# Patient Record
Sex: Male | Born: 1955 | Race: Black or African American | Hispanic: No | Smoking: Never smoker
Health system: Southern US, Community
[De-identification: ages and names within clinical notes are randomized; demographics above are authoritative.]

## PROBLEM LIST (undated history)

## (undated) DIAGNOSIS — I639 Cerebral infarction, unspecified: Secondary | ICD-10-CM

## (undated) DIAGNOSIS — I10 Essential (primary) hypertension: Secondary | ICD-10-CM

## (undated) DIAGNOSIS — F0390 Unspecified dementia without behavioral disturbance: Secondary | ICD-10-CM

## (undated) DIAGNOSIS — J189 Pneumonia, unspecified organism: Secondary | ICD-10-CM

## (undated) DIAGNOSIS — E119 Type 2 diabetes mellitus without complications: Secondary | ICD-10-CM

## (undated) DIAGNOSIS — F209 Schizophrenia, unspecified: Secondary | ICD-10-CM

---

## 1998-10-01 ENCOUNTER — Emergency Department (HOSPITAL_COMMUNITY): Admission: EM | Admit: 1998-10-01 | Discharge: 1998-10-01 | Payer: Self-pay | Admitting: Emergency Medicine

## 1999-02-13 ENCOUNTER — Other Ambulatory Visit (HOSPITAL_COMMUNITY): Admission: RE | Admit: 1999-02-13 | Discharge: 1999-02-15 | Payer: Self-pay | Admitting: Psychiatry

## 1999-02-23 ENCOUNTER — Other Ambulatory Visit (HOSPITAL_COMMUNITY): Admission: RE | Admit: 1999-02-23 | Discharge: 1999-03-08 | Payer: Self-pay | Admitting: Psychiatry

## 2005-03-13 ENCOUNTER — Emergency Department (HOSPITAL_COMMUNITY): Admission: EM | Admit: 2005-03-13 | Discharge: 2005-03-13 | Payer: Self-pay | Admitting: Emergency Medicine

## 2006-10-04 ENCOUNTER — Emergency Department (HOSPITAL_COMMUNITY): Admission: EM | Admit: 2006-10-04 | Discharge: 2006-10-04 | Payer: Self-pay | Admitting: Emergency Medicine

## 2006-10-27 ENCOUNTER — Emergency Department (HOSPITAL_COMMUNITY): Admission: EM | Admit: 2006-10-27 | Discharge: 2006-10-27 | Payer: Self-pay | Admitting: Emergency Medicine

## 2007-07-13 ENCOUNTER — Encounter: Payer: Self-pay | Admitting: Family Medicine

## 2007-07-30 ENCOUNTER — Encounter: Payer: Self-pay | Admitting: Family Medicine

## 2007-08-30 ENCOUNTER — Encounter: Payer: Self-pay | Admitting: Family Medicine

## 2007-09-27 ENCOUNTER — Encounter: Payer: Self-pay | Admitting: Family Medicine

## 2008-08-30 ENCOUNTER — Ambulatory Visit: Payer: Self-pay | Admitting: Family Medicine

## 2008-10-12 ENCOUNTER — Ambulatory Visit: Payer: Self-pay | Admitting: Internal Medicine

## 2008-10-15 ENCOUNTER — Emergency Department (HOSPITAL_COMMUNITY): Admission: EM | Admit: 2008-10-15 | Discharge: 2008-10-15 | Payer: Self-pay | Admitting: Emergency Medicine

## 2008-10-26 ENCOUNTER — Ambulatory Visit: Payer: Self-pay | Admitting: Family Medicine

## 2008-10-26 LAB — CONVERTED CEMR LAB
AST: 35 units/L (ref 0–37)
Alkaline Phosphatase: 50 units/L (ref 39–117)
BUN: 15 mg/dL (ref 6–23)
Calcium: 9.2 mg/dL (ref 8.4–10.5)
Chloride: 102 meq/L (ref 96–112)
Eosinophils Absolute: 0 10*3/uL (ref 0.0–0.7)
Eosinophils Relative: 0 % (ref 0–5)
Free T4: 0.89 ng/dL (ref 0.89–1.80)
HCT: 43 % (ref 39.0–52.0)
LDL Cholesterol: 86 mg/dL (ref 0–99)
Lymphocytes Relative: 56 % — ABNORMAL HIGH (ref 12–46)
Lymphs Abs: 3.3 10*3/uL (ref 0.7–4.0)
MCHC: 36.3 g/dL — ABNORMAL HIGH (ref 30.0–36.0)
Neutro Abs: 1.6 10*3/uL — ABNORMAL LOW (ref 1.7–7.7)
Platelets: 139 10*3/uL — ABNORMAL LOW (ref 150–400)
Potassium: 4 meq/L (ref 3.5–5.3)
RBC: 5.22 M/uL (ref 4.22–5.81)
Sodium: 137 meq/L (ref 135–145)
TSH: 2.12 microintl units/mL (ref 0.350–4.500)
Triglycerides: 80 mg/dL (ref <150)
WBC: 5.9 10*3/uL (ref 4.0–10.5)

## 2008-12-01 ENCOUNTER — Ambulatory Visit: Payer: Self-pay | Admitting: Family Medicine

## 2009-01-29 ENCOUNTER — Emergency Department (HOSPITAL_COMMUNITY): Admission: EM | Admit: 2009-01-29 | Discharge: 2009-01-29 | Payer: Self-pay | Admitting: Emergency Medicine

## 2009-03-27 ENCOUNTER — Inpatient Hospital Stay (HOSPITAL_COMMUNITY): Admission: EM | Admit: 2009-03-27 | Discharge: 2009-03-31 | Payer: Self-pay | Admitting: Emergency Medicine

## 2009-03-28 ENCOUNTER — Encounter (INDEPENDENT_AMBULATORY_CARE_PROVIDER_SITE_OTHER): Payer: Self-pay | Admitting: Internal Medicine

## 2009-03-28 ENCOUNTER — Ambulatory Visit: Payer: Self-pay | Admitting: Vascular Surgery

## 2009-03-30 ENCOUNTER — Ambulatory Visit: Payer: Self-pay | Admitting: Physical Medicine & Rehabilitation

## 2009-04-24 ENCOUNTER — Inpatient Hospital Stay (HOSPITAL_COMMUNITY): Admission: EM | Admit: 2009-04-24 | Discharge: 2009-04-25 | Payer: Self-pay | Admitting: Emergency Medicine

## 2009-04-24 ENCOUNTER — Encounter (INDEPENDENT_AMBULATORY_CARE_PROVIDER_SITE_OTHER): Payer: Self-pay | Admitting: Internal Medicine

## 2009-04-25 ENCOUNTER — Encounter (INDEPENDENT_AMBULATORY_CARE_PROVIDER_SITE_OTHER): Payer: Self-pay | Admitting: Internal Medicine

## 2009-05-02 ENCOUNTER — Ambulatory Visit: Payer: Self-pay | Admitting: Internal Medicine

## 2009-05-18 ENCOUNTER — Ambulatory Visit: Payer: Self-pay | Admitting: Internal Medicine

## 2009-05-18 ENCOUNTER — Inpatient Hospital Stay (HOSPITAL_COMMUNITY)
Admission: EM | Admit: 2009-05-18 | Discharge: 2009-05-25 | Payer: Medicaid - Out of State | Admitting: Emergency Medicine

## 2009-05-19 ENCOUNTER — Encounter (INDEPENDENT_AMBULATORY_CARE_PROVIDER_SITE_OTHER): Payer: Self-pay | Admitting: Internal Medicine

## 2009-07-06 ENCOUNTER — Encounter (INDEPENDENT_AMBULATORY_CARE_PROVIDER_SITE_OTHER): Payer: Self-pay | Admitting: Internal Medicine

## 2009-07-06 ENCOUNTER — Ambulatory Visit: Payer: Self-pay | Admitting: Vascular Surgery

## 2009-07-06 ENCOUNTER — Inpatient Hospital Stay (HOSPITAL_COMMUNITY): Admission: EM | Admit: 2009-07-06 | Discharge: 2009-07-07 | Payer: Self-pay | Admitting: Emergency Medicine

## 2010-03-05 ENCOUNTER — Other Ambulatory Visit: Payer: Self-pay | Admitting: Emergency Medicine

## 2010-03-05 ENCOUNTER — Emergency Department (HOSPITAL_COMMUNITY): Admission: EM | Admit: 2010-03-05 | Discharge: 2010-03-06 | Payer: Self-pay | Admitting: Emergency Medicine

## 2010-03-29 ENCOUNTER — Emergency Department (HOSPITAL_COMMUNITY): Admission: EM | Admit: 2010-03-29 | Discharge: 2010-03-29 | Payer: Self-pay | Admitting: Emergency Medicine

## 2010-10-11 LAB — DIFFERENTIAL
Basophils Absolute: 0 10*3/uL (ref 0.0–0.1)
Basophils Relative: 1 % (ref 0–1)
Eosinophils Absolute: 0.1 10*3/uL (ref 0.0–0.7)
Neutro Abs: 1.4 10*3/uL — ABNORMAL LOW (ref 1.7–7.7)
Neutrophils Relative %: 27 % — ABNORMAL LOW (ref 43–77)

## 2010-10-11 LAB — CBC
Hemoglobin: 15.2 g/dL (ref 13.0–17.0)
MCH: 29.5 pg (ref 26.0–34.0)
Platelets: 125 10*3/uL — ABNORMAL LOW (ref 150–400)
RDW: 14.2 % (ref 11.5–15.5)

## 2010-10-11 LAB — BASIC METABOLIC PANEL
BUN: 12 mg/dL (ref 6–23)
Calcium: 9.4 mg/dL (ref 8.4–10.5)
Creatinine, Ser: 1.01 mg/dL (ref 0.4–1.5)
GFR calc Af Amer: >60 mL/min (ref >60)
GFR calc non Af Amer: >60 mL/min (ref >60)
Glucose, Bld: 98 mg/dL (ref 70–99)
Potassium: 4 mEq/L (ref 3.5–5.1)
Sodium: 136 mEq/L (ref 135–145)

## 2010-10-11 LAB — GLUCOSE, CAPILLARY: Glucose-Capillary: 120 mg/dL — ABNORMAL HIGH (ref 70–99)

## 2010-10-11 LAB — POCT CARDIAC MARKERS: Troponin i, poc: <0.05 ng/mL (ref 0.00–0.09)

## 2010-10-12 LAB — CBC
HCT: 40.6 % (ref 39.0–52.0)
Platelets: 129 10*3/uL — ABNORMAL LOW (ref 150–400)
RDW: 14.1 % (ref 11.5–15.5)
WBC: 6.2 10*3/uL (ref 4.0–10.5)

## 2010-10-12 LAB — BASIC METABOLIC PANEL
BUN: 5 mg/dL — ABNORMAL LOW (ref 6–23)
Calcium: 9.2 mg/dL (ref 8.4–10.5)
Creatinine, Ser: 0.84 mg/dL (ref 0.4–1.5)
GFR calc Af Amer: >60 mL/min (ref >60)
GFR calc non Af Amer: >60 mL/min (ref >60)
GFR calc non Af Amer: >60 mL/min (ref >60)
Glucose, Bld: 89 mg/dL (ref 70–99)
Potassium: 3.7 mEq/L (ref 3.5–5.1)
Sodium: 138 mEq/L (ref 135–145)

## 2010-10-12 LAB — DIFFERENTIAL
Basophils Absolute: 0 10*3/uL (ref 0.0–0.1)
Lymphocytes Relative: 62 % — ABNORMAL HIGH (ref 12–46)
Neutro Abs: 1.2 10*3/uL — ABNORMAL LOW (ref 1.7–7.7)

## 2010-10-12 LAB — GLUCOSE, CAPILLARY: Glucose-Capillary: 69 mg/dL — ABNORMAL LOW (ref 70–99)

## 2010-10-30 LAB — DIFFERENTIAL
Eosinophils Relative: 2 % (ref 0–5)
Lymphocytes Relative: 64 % — ABNORMAL HIGH (ref 12–46)
Lymphs Abs: 4.4 10*3/uL — ABNORMAL HIGH (ref 0.7–4.0)
Monocytes Absolute: 1 10*3/uL (ref 0.1–1.0)
Neutro Abs: 1.3 10*3/uL — ABNORMAL LOW (ref 1.7–7.7)

## 2010-10-30 LAB — CK TOTAL AND CKMB (NOT AT ARMC)
CK, MB: 1 ng/mL (ref 0.3–4.0)
CK, MB: 1.2 ng/mL (ref 0.3–4.0)
Relative Index: 1 (ref 0.0–2.5)
Relative Index: INVALID (ref 0.0–2.5)
Total CK: 115 U/L (ref 7–232)
Total CK: 96 U/L (ref 7–232)

## 2010-10-30 LAB — COMPREHENSIVE METABOLIC PANEL
AST: 30 U/L (ref 0–37)
Albumin: 4.2 g/dL (ref 3.5–5.2)
BUN: 7 mg/dL (ref 6–23)
Calcium: 9.5 mg/dL (ref 8.4–10.5)
Creatinine, Ser: 0.82 mg/dL (ref 0.4–1.5)
GFR calc Af Amer: >60 mL/min (ref >60)
Total Protein: 8.3 g/dL (ref 6.0–8.3)

## 2010-10-30 LAB — CBC
HCT: 42.8 % (ref 39.0–52.0)
Hemoglobin: 14.5 g/dL (ref 13.0–17.0)
Hemoglobin: 15 g/dL (ref 13.0–17.0)
MCHC: 33.8 g/dL (ref 30.0–36.0)
MCHC: 34.2 g/dL (ref 30.0–36.0)
MCV: 88.6 fL (ref 78.0–100.0)
Platelets: 149 10*3/uL — ABNORMAL LOW (ref 150–400)
Platelets: 150 10*3/uL (ref 150–400)
RBC: 4.97 MIL/uL (ref 4.22–5.81)
RDW: 14.1 % (ref 11.5–15.5)
WBC: 6.2 10*3/uL (ref 4.0–10.5)

## 2010-10-30 LAB — URINALYSIS, ROUTINE W REFLEX MICROSCOPIC
Glucose, UA: NEGATIVE mg/dL
Hgb urine dipstick: NEGATIVE
Ketones, ur: NEGATIVE mg/dL
Protein, ur: NEGATIVE mg/dL
Urobilinogen, UA: 1 mg/dL (ref 0.0–1.0)

## 2010-10-30 LAB — URINE DRUGS OF ABUSE SCREEN W ALC, ROUTINE (REF LAB)
Barbiturate Quant, Ur: NEGATIVE
Benzodiazepines.: NEGATIVE
Cocaine Metabolites: NEGATIVE
Methadone: NEGATIVE
Phencyclidine (PCP): NEGATIVE
Propoxyphene: NEGATIVE

## 2010-10-30 LAB — HEMOGLOBIN A1C: Mean Plasma Glucose: 117 mg/dL

## 2010-10-30 LAB — CARDIAC PANEL(CRET KIN+CKTOT+MB+TROPI)
CK, MB: 0.9 ng/mL (ref 0.3–4.0)
CK, MB: 1 ng/mL (ref 0.3–4.0)
Relative Index: INVALID (ref 0.0–2.5)
Troponin I: 0.01 ng/mL (ref 0.00–0.06)
Troponin I: 0.01 ng/mL (ref 0.00–0.06)

## 2010-10-30 LAB — BASIC METABOLIC PANEL
BUN: 7 mg/dL (ref 6–23)
BUN: 7 mg/dL (ref 6–23)
CO2: 28 mEq/L (ref 19–32)
Calcium: 9 mg/dL (ref 8.4–10.5)
Chloride: 103 mEq/L (ref 96–112)
Creatinine, Ser: 0.87 mg/dL (ref 0.4–1.5)
GFR calc Af Amer: >60 mL/min (ref >60)
GFR calc non Af Amer: >60 mL/min (ref >60)
Glucose, Bld: 91 mg/dL (ref 70–99)
Potassium: 3.6 mEq/L (ref 3.5–5.1)
Sodium: 138 mEq/L (ref 135–145)

## 2010-10-30 LAB — TROPONIN I
Troponin I: 0.01 ng/mL (ref 0.00–0.06)
Troponin I: 0.01 ng/mL (ref 0.00–0.06)

## 2010-10-30 LAB — GLUCOSE, CAPILLARY: Glucose-Capillary: 89 mg/dL (ref 70–99)

## 2010-10-30 LAB — LIPID PANEL
Cholesterol: 122 mg/dL (ref 0–200)
HDL: 36 mg/dL — ABNORMAL LOW (ref >39)
Total CHOL/HDL Ratio: 3.4 RATIO

## 2010-10-30 LAB — APTT: aPTT: 33 seconds (ref 24–37)

## 2010-11-01 LAB — LIPID PANEL
HDL: 36 mg/dL — ABNORMAL LOW (ref >39)
Triglycerides: 44 mg/dL (ref <150)
VLDL: 9 mg/dL (ref 0–40)

## 2010-11-01 LAB — CBC
HCT: 40.6 % (ref 39.0–52.0)
HCT: 42.7 % (ref 39.0–52.0)
Hemoglobin: 13.1 g/dL (ref 13.0–17.0)
MCV: 88.4 fL (ref 78.0–100.0)
Platelets: 157 10*3/uL (ref 150–400)
Platelets: 171 10*3/uL (ref 150–400)
RBC: 4.37 MIL/uL (ref 4.22–5.81)
RBC: 4.83 MIL/uL (ref 4.22–5.81)
RBC: 4.92 MIL/uL (ref 4.22–5.81)
WBC: 6.1 10*3/uL (ref 4.0–10.5)
WBC: 8 10*3/uL (ref 4.0–10.5)
WBC: 8.8 10*3/uL (ref 4.0–10.5)
WBC: 9 10*3/uL (ref 4.0–10.5)

## 2010-11-01 LAB — COMPREHENSIVE METABOLIC PANEL
ALT: 93 U/L — ABNORMAL HIGH (ref 0–53)
AST: 71 U/L — ABNORMAL HIGH (ref 0–37)
AST: 74 U/L — ABNORMAL HIGH (ref 0–37)
Albumin: 3.9 g/dL (ref 3.5–5.2)
Alkaline Phosphatase: 57 U/L (ref 39–117)
BUN: 4 mg/dL — ABNORMAL LOW (ref 6–23)
CO2: 24 mEq/L (ref 19–32)
Chloride: 106 mEq/L (ref 96–112)
Chloride: 107 mEq/L (ref 96–112)
Creatinine, Ser: 0.82 mg/dL (ref 0.4–1.5)
Creatinine, Ser: 0.89 mg/dL (ref 0.4–1.5)
GFR calc Af Amer: >60 mL/min (ref >60)
GFR calc Af Amer: >60 mL/min (ref >60)
GFR calc non Af Amer: >60 mL/min (ref >60)
Potassium: 3.9 mEq/L (ref 3.5–5.1)
Sodium: 139 mEq/L (ref 135–145)
Total Bilirubin: 0.6 mg/dL (ref 0.3–1.2)
Total Bilirubin: 1.8 mg/dL — ABNORMAL HIGH (ref 0.3–1.2)

## 2010-11-01 LAB — BASIC METABOLIC PANEL
BUN: 5 mg/dL — ABNORMAL LOW (ref 6–23)
CO2: 25 mEq/L (ref 19–32)
CO2: 31 mEq/L (ref 19–32)
Calcium: 8.6 mg/dL (ref 8.4–10.5)
Calcium: 8.7 mg/dL (ref 8.4–10.5)
Calcium: 9.1 mg/dL (ref 8.4–10.5)
Chloride: 103 mEq/L (ref 96–112)
Chloride: 105 mEq/L (ref 96–112)
GFR calc Af Amer: >60 mL/min (ref >60)
GFR calc Af Amer: >60 mL/min (ref >60)
GFR calc Af Amer: >60 mL/min (ref >60)
GFR calc non Af Amer: >60 mL/min (ref >60)
GFR calc non Af Amer: >60 mL/min (ref >60)
GFR calc non Af Amer: >60 mL/min (ref >60)
Potassium: 3.6 mEq/L (ref 3.5–5.1)
Potassium: 3.7 mEq/L (ref 3.5–5.1)
Sodium: 138 mEq/L (ref 135–145)
Sodium: 138 mEq/L (ref 135–145)

## 2010-11-01 LAB — BLOOD GAS, ARTERIAL
Drawn by: 103701
MECHVT: 500 mL
TCO2: 21.5 mmol/L (ref 0–100)
pCO2 arterial: 42.8 mmHg (ref 35.0–45.0)
pH, Arterial: 7.379 (ref 7.350–7.450)

## 2010-11-01 LAB — GLUCOSE, CAPILLARY: Glucose-Capillary: 76 mg/dL (ref 70–99)

## 2010-11-01 LAB — VITAMIN B12: Vitamin B-12: 315 pg/mL (ref 211–911)

## 2010-11-01 LAB — CARDIAC PANEL(CRET KIN+CKTOT+MB+TROPI)
Relative Index: 0.5 (ref 0.0–2.5)
Total CK: 555 U/L — ABNORMAL HIGH (ref 7–232)
Troponin I: 0.04 ng/mL (ref 0.00–0.06)
Troponin I: 0.04 ng/mL (ref 0.00–0.06)

## 2010-11-01 LAB — ETHANOL: Alcohol, Ethyl (B): <5 mg/dL (ref 0–10)

## 2010-11-01 LAB — URINALYSIS, ROUTINE W REFLEX MICROSCOPIC
Bilirubin Urine: NEGATIVE
Glucose, UA: NEGATIVE mg/dL
Specific Gravity, Urine: 1.018 (ref 1.005–1.030)

## 2010-11-01 LAB — DIFFERENTIAL
Basophils Absolute: 0 10*3/uL (ref 0.0–0.1)
Eosinophils Absolute: 0 10*3/uL (ref 0.0–0.7)
Eosinophils Relative: 0 % (ref 0–5)
Lymphocytes Relative: 33 % (ref 12–46)
Monocytes Absolute: 0.9 10*3/uL (ref 0.1–1.0)

## 2010-11-01 LAB — RAPID URINE DRUG SCREEN, HOSP PERFORMED
Barbiturates: NOT DETECTED
Benzodiazepines: POSITIVE — AB
Opiates: NOT DETECTED

## 2010-11-01 LAB — CULTURE, BLOOD (ROUTINE X 2)

## 2010-11-01 LAB — URINE MICROSCOPIC-ADD ON

## 2010-11-01 LAB — MAGNESIUM: Magnesium: 1.8 mg/dL (ref 1.5–2.5)

## 2010-11-01 LAB — TSH: TSH: 2.567 u[IU]/mL (ref 0.350–4.500)

## 2010-11-01 LAB — RPR: RPR Ser Ql: NONREACTIVE

## 2010-11-02 LAB — RENAL FUNCTION PANEL
Albumin: 3.8 g/dL (ref 3.5–5.2)
CO2: 29 mEq/L (ref 19–32)
Calcium: 9 mg/dL (ref 8.4–10.5)
Creatinine, Ser: 0.83 mg/dL (ref 0.4–1.5)
GFR calc Af Amer: >60 mL/min (ref >60)
GFR calc non Af Amer: >60 mL/min (ref >60)
Phosphorus: 2.9 mg/dL (ref 2.3–4.6)
Sodium: 138 mEq/L (ref 135–145)

## 2010-11-02 LAB — URINE CULTURE
Colony Count: NO GROWTH
Culture: NO GROWTH

## 2010-11-02 LAB — RAPID URINE DRUG SCREEN, HOSP PERFORMED
Barbiturates: NOT DETECTED
Cocaine: NOT DETECTED
Opiates: NOT DETECTED

## 2010-11-02 LAB — URINALYSIS, ROUTINE W REFLEX MICROSCOPIC
Glucose, UA: NEGATIVE mg/dL
Protein, ur: 30 mg/dL — AB
Specific Gravity, Urine: 1.017 (ref 1.005–1.030)
Urobilinogen, UA: 1 mg/dL (ref 0.0–1.0)

## 2010-11-02 LAB — POCT CARDIAC MARKERS
CKMB, poc: 1.2 ng/mL (ref 1.0–8.0)
CKMB, poc: 1.8 ng/mL (ref 1.0–8.0)
CKMB, poc: <1 ng/mL — ABNORMAL LOW (ref 1.0–8.0)
Myoglobin, poc: 367 ng/mL (ref 12–200)

## 2010-11-02 LAB — DIFFERENTIAL
Basophils Relative: 0 % (ref 0–1)
Eosinophils Absolute: 0 10*3/uL (ref 0.0–0.7)
Lymphs Abs: 2.3 10*3/uL (ref 0.7–4.0)
Monocytes Absolute: 0.9 10*3/uL (ref 0.1–1.0)
Monocytes Relative: 13 % — ABNORMAL HIGH (ref 3–12)

## 2010-11-02 LAB — BASIC METABOLIC PANEL
BUN: 10 mg/dL (ref 6–23)
BUN: 8 mg/dL (ref 6–23)
CO2: 26 mEq/L (ref 19–32)
CO2: 28 mEq/L (ref 19–32)
Calcium: 9.2 mg/dL (ref 8.4–10.5)
Chloride: 102 mEq/L (ref 96–112)
Creatinine, Ser: 0.95 mg/dL (ref 0.4–1.5)
GFR calc non Af Amer: >60 mL/min (ref >60)
Glucose, Bld: 86 mg/dL (ref 70–99)
Glucose, Bld: 99 mg/dL (ref 70–99)
Sodium: 139 mEq/L (ref 135–145)

## 2010-11-02 LAB — URINALYSIS, MICROSCOPIC ONLY
Bilirubin Urine: NEGATIVE
Glucose, UA: NEGATIVE mg/dL
Hgb urine dipstick: NEGATIVE
Ketones, ur: NEGATIVE mg/dL
Specific Gravity, Urine: 1.019 (ref 1.005–1.030)
pH: 6 (ref 5.0–8.0)

## 2010-11-02 LAB — URINE MICROSCOPIC-ADD ON

## 2010-11-02 LAB — CBC
Hemoglobin: 14.3 g/dL (ref 13.0–17.0)
MCHC: 33.7 g/dL (ref 30.0–36.0)
MCHC: 34.1 g/dL (ref 30.0–36.0)
MCV: 88.4 fL (ref 78.0–100.0)
Platelets: 129 10*3/uL — ABNORMAL LOW (ref 150–400)
Platelets: 135 10*3/uL — ABNORMAL LOW (ref 150–400)
RDW: 14.6 % (ref 11.5–15.5)

## 2010-11-02 LAB — CK TOTAL AND CKMB (NOT AT ARMC): Total CK: 166 U/L (ref 7–232)

## 2010-11-02 LAB — CARDIAC PANEL(CRET KIN+CKTOT+MB+TROPI)
Relative Index: 0.6 (ref 0.0–2.5)
Troponin I: 0.01 ng/mL (ref 0.00–0.06)

## 2010-11-02 LAB — ETHANOL: Alcohol, Ethyl (B): <5 mg/dL (ref 0–10)

## 2010-11-02 LAB — GLUCOSE, CAPILLARY: Glucose-Capillary: 101 mg/dL — ABNORMAL HIGH (ref 70–99)

## 2010-11-03 LAB — LIPID PANEL
HDL: 29 mg/dL — ABNORMAL LOW (ref >39)
Triglycerides: 77 mg/dL (ref <150)
VLDL: 15 mg/dL (ref 0–40)

## 2010-11-03 LAB — HOMOCYSTEINE: Homocysteine: 10.8 umol/L (ref 4.0–15.4)

## 2010-11-03 LAB — RAPID URINE DRUG SCREEN, HOSP PERFORMED
Amphetamines: NOT DETECTED
Cocaine: NOT DETECTED
Opiates: NOT DETECTED
Tetrahydrocannabinol: NOT DETECTED

## 2010-11-03 LAB — URINALYSIS, ROUTINE W REFLEX MICROSCOPIC
Bilirubin Urine: NEGATIVE
Hgb urine dipstick: NEGATIVE
Nitrite: POSITIVE — AB
Protein, ur: NEGATIVE mg/dL
Specific Gravity, Urine: 1.019 (ref 1.005–1.030)
Urobilinogen, UA: 1 mg/dL (ref 0.0–1.0)

## 2010-11-03 LAB — URINE MICROSCOPIC-ADD ON

## 2010-11-03 LAB — SEDIMENTATION RATE: Sed Rate: 12 mm/hr (ref 0–16)

## 2010-11-03 LAB — BASIC METABOLIC PANEL
BUN: 9 mg/dL (ref 6–23)
Calcium: 9.2 mg/dL (ref 8.4–10.5)
GFR calc non Af Amer: >60 mL/min (ref >60)
Glucose, Bld: 90 mg/dL (ref 70–99)

## 2010-11-03 LAB — CBC
Platelets: 148 10*3/uL — ABNORMAL LOW (ref 150–400)
RDW: 14.6 % (ref 11.5–15.5)
WBC: 6.6 10*3/uL (ref 4.0–10.5)

## 2010-11-03 LAB — HEMOGLOBIN A1C: Hgb A1c MFr Bld: 6 % (ref 4.6–6.1)

## 2010-11-03 LAB — ETHANOL: Alcohol, Ethyl (B): <5 mg/dL (ref 0–10)

## 2010-11-03 LAB — AMMONIA: Ammonia: 28 umol/L (ref 11–35)

## 2010-12-11 NOTE — Consult Note (Signed)
NAME:  Chad Mcclure, Chad Mcclure NO.:  [id number]   MEDICAL RECORD NO.:  [id number]          PATIENT TYPE:  INP   LOCATION:  3008                         FACILITY:  [facility]   PHYSICIAN:  Chad Mcclure L. Reynolds, M.D.DATE OF BIRTH:  06-Feb-1956   DATE OF CONSULTATION:  03/27/2009  DATE OF DISCHARGE:                                 CONSULTATION   REQUESTING PHYSICIAN:  Fleet Contras, M.D.   CHIEF COMPLAINT:  Acute CVA.   Code stroke log:  The patient at time of the ED initial exam was March 27, 2009, at 11:30 a.m.  NIH stroke scale was 2.  At that time,  neurology consult was called on March 28, 2009, at 8:30 a.m.  No TPA  was given secondary to out of window.   HISTORY OF PRESENT ILLNESS:  This is a 55 year old [place]  male, last CVA in 2007, in [place], [place].  The patient states this  left him with a left arm deficit, which mostly consists of weakness.  Over time, he has noted that his left arm has progressively become stiff  and an increased tone, and he states that he has neglected his left arm  and not had the ability to go to PT or rehab.  The patient goes to  [facility] for his medications and has been consistently taking Celexa  20 mg a day and lisinopril 20 mg a day.  He states he was told he should  be on aspirin, however, he believes that the hypertensive medications  were adequate.  Apparently, this past Friday on March 24, 2009, he  awoke and noted a slight increase in his left arm, but a significant  difference in his speech as he was very dysarthric.  The patient called  EMS and was brought to the emergency room via ambulance.  At the time of  arrival in the emergency room his vital were 138/81, pulse 54,  respiration 15, temperature 98.2.  At that time, CT and MRI were  obtained which showed a left acute deep white matter infarct.  His NIH  stroke scale was 2, basically for his dysarthria and his decreased  sensation in his left arm.  The  patient states at this time his  sensation for the most part come back.  He is being consulted on floor  3000.   PAST MEDICAL HISTORY:  1. Stroke back in 2007, along with multiple other lacune infarcts      noted on MRI.  2. Hypertension.  3. Depression.   STROKE RISK FACTORS:  1. Hypertension.  2. Hyperlipidemia.  3. CVA.  4. Tobacco abuse.   MEDICATIONS:  In the hospital, the patient and placed on;  1. Aspirin 325 mg daily.  2. Celexa 20 mg daily.  3. Hydrochlorothiazide 25 mg daily.  4. Lisinopril 20 mg daily.   ALLERGIES:  NO KNOWN DRUG ALLERGIES.   SOCIAL HISTORY:  The patient states he lives alone.  He goes to  [employer].  He is disabled secondary to old CVA.  He does smoke, does  not drink or do illicit drugs.   REVIEW OF SYSTEMS:  CARDIOVASCULAR:  Multiple lacune infarcts.  ENDOCRINE:  Hyperlipidemia.  PSYCHIATRIC:  Depression.  No other  positives in the review of systems at this time.   PHYSICAL EXAMINATION:  VITAL SIGNS:  Temperature 97-98 degrees, blood  pressure ranges 127-131 systolically and 72-76 diastolically.  Heart  rate 55, respiration ranges between 18-20.  O2 saturation ranges between  96-97% on room air.  HEAD:  Normocephalic, atraumatic.  NECK:  Supple.  Esophagus midline.  Trachea midline.  Negative for  bruits bilaterally.  LUNGS:  Clear to auscultation bilaterally.  CARDIOVASCULAR:  Regular rate and rhythm.  S1-S2 is audible.  ABDOMEN:  Soft, nontender, nondistended.  Bowel sounds in all four  quadrants.  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  His visual fields are intact to double  simultaneous stimuli.  He does have a right pupil of 5 mm, left pupil of  3 mm, both are equal, round and reactive to light.  The patient does  have decreased facial sensation on the right V1 and decreased sensation  on the left V2 and V3.  His smile is equal and symmetrical.  The patient  has no facial  droop at this time.  The patient's bilateral arms and  bilateral legs are full strength 5/5.  He does have increased tone in  his left arm in a flexion like manner.  He is able to extend his arm to  approximately 160 degrees.  He has difficulty extending his left  fingers, however, I am able to passively extend his fingers without any  discomfort.  His grip on his left hand is very weak and he states this  has been true since his stroke in 2007.  His sensation is full to  pinprick, light touch and vibration throughout.  He has downgoing toes  bilaterally.  Deep tendon reflexes are 2+ throughout.  His finger-to-  nose and heel-to-shin were both non-dysmetric, smooth without any  difficulty.   TEST RESULTS:  ESR 12, ammonia 28.  UA shows many bacteria, small  leukocytes and positive nitrites.  Sodium 139, potassium 3.9, chloride  103, CO2 of 28, BUN 9, creatinine 0.84, glucose 90, white blood cell  count 6.6, hemoglobin/hematocrit 14.8 and 43.7, platelets 148,  cholesterol 128, triglycerides 77, HDL is 29, LDL 84.  TSH 1.78.  Urine  drug screen was negative.   EKG shows normal sinus rhythm with a first-degree block.   2-D echo is pending.  Carotid Dopplers pending.  MRI of the head shows a  small area of acute deep white matter infarct and left posterior frontal  subcortical white matter, extensive atrophy and multiple small lacunes  from small vessel disease.  At this time, MRA reading is pending.   RECOMMENDATIONS:  This is a 56 year old male with multiple lacune  infarcts with a new left deep white matter, acute infarct of small  vessel disease.  The patient states that he has not been on aspirin, but  has been taking his hypertension medications on a regular basis.  We  would recommend at this time the patient be placed on an  aspirin daily, also a low dose of Zocor or statin for his lipids.  Finality of stroke workup is still pending.  We will continue to follow  this patient  while he is in-house.   Thank you very much for this consultation.     ______________________________  Chad Morn, PA-C      Chad Mcclure  Chad Mcclure, M.D.  Electronically Signed    DS/MEDQ  D:  03/28/2009  T:  03/28/2009  Job:  [id number]   cc:   Chad Mcclure L. Thad Ranger, M.D.  Fleet Contras, M.D.

## 2010-12-11 NOTE — Discharge Summary (Signed)
NAME:  Chad Mcclure, Chad Mcclure NO.:  [id number]   MEDICAL RECORD NO.:  [id number]          PATIENT TYPE:  INP   LOCATION:  3008                         FACILITY:  [facility]   PHYSICIAN:  Fleet Contras, M.D.    DATE OF BIRTH:  [birth year]   DATE OF ADMISSION:  03/27/2009  DATE OF DISCHARGE:  03/30/2009                               DISCHARGE SUMMARY   HISTORY OF PRESENT ILLNESS:  Chad Mcclure is a 55 year old African  American gentleman with past medical history significant for systemic  hypertension, depression, tobacco abuse, and history of cerebrovascular  accident with left hemiparesis in the past was brought into the  emergency room at [facility] by a friend with 3 days of  worsening left-sided weakness, slurred speech, and dropping things on  his left hand.  He had no history of falls, syncope, seizures,  headaches, or vision problems.  He was able to go to church the day  prior to presentation.  He had no chest pain, shortness of breath,  palpitations, orthopnea, or PND.  In the emergency room, his vital signs  were stable.  He was noted to have a left hemiparesis.  CT scan of the  head was negative for acute intracranial lesions.  He was admitted to  the hospital for further evaluation and close monitoring.   HOSPITAL COURSE:  On admission, blood pressure was 139/82, heart rate of  50, respiratory rate of 18, temperature was 98 degrees Fahrenheit, and  O2 saturations on room air was 98%.  His pupils were equal and reactive  to light and accommodation.  He was alert and oriented x3.  His speech  was slightly garbled but comprehensible.  He had no facial droop.  His  neck exam was normal with no elevated JVD.  Chest was clear to  auscultation.  Heart sounds 1 and 2 were heard.  Abdomen was soft and  nontender.  Bowel sounds present.  Extremities showed no edema, calf  tenderness, or swelling.  CNS; cranial nerves II through XII were intact  with left-sided hand  weakness with clumsiness but power was 5/5  bilaterally.  His laboratory data showed a white count of 6.6,  hemoglobin 14.8, hematocrit 43.7, and platelet count of 148.  Sodium was  135, potassium 3.9, chloride 103, bicarbonate of 28, BUN was 9,  creatinine 0.84, and glucose was 90.  Ammonia level was 28.  Urine drug  screen was negative.  Urinalysis was positive for nitrites, small  leukocyte esterase, 3-6 wbc's per high-power field, and many bacteria.  EKG showed normal sinus bradycardia with no acute changes.  CT scan of  the head was negative for acute abnormality.  There were lacunar  infarcts versus small vessel ischemic changes.  MRI of the head showed a  small area of acute deep white matter infarction in the left posterior  frontal subcortical white matter.  The MRA was normal and showed a  normal internal and external carotid arteries.  A swallow screen was  negative.  Physical therapy and occupation therapy consults were  requested and also stroke team evaluation was requested for  antiplatelet  therapy.  The patient's condition improved during the course of  hospitalization.  A 2-D echocardiogram was performed and this revealed  no intracardiac thrombi with normal left ventricular ejection fraction,  55-65%.  Carotid Doppler was negative for significant stenosis.  His  homocysteine level was within normal limits.  TSH was 1.78.  Total  cholesterol was 128, triglycerides 77, HDL was 29, and LDL 84.  He was  started on Niaspan 500 mg once a day to bring up HDL levels.  The  neurologist recommended aspirin therapy for stroke prevention.  Physical  therapy and occupational therapy felt the patient was unsafe with  impulsiveness and unsteadiness of his gait and recommended  rehabilitation in a skilled nursing facility setting.  This was accepted  by the patient and this will now be seared in the [facility] area  prior to discharge.   DISCHARGE DIAGNOSES:  1. Cerebrovascular  accident, acute on chronic.  2. Systemic hypertension.  3. Dyslipidemia.   DISCHARGE MEDICATIONS:  1. Aspirin 325 mg once a day.  2. Lisinopril 20 mg once a day.  3. Hydrochlorothiazide 25 mg once a day.  4. Celexa 20 mg once a day.  5. Niaspan 500 mg p.o. at bedtime.  He received mechanical DVT prophylaxis and Protonix 40 mg p.o. for GI  prophylaxis during the hospitalization.  He will follow up with me in  the office once discharged from rehab.  This discharge plan has been  discussed with him and his questions answered.      Fleet Contras, M.D.  Electronically Signed     EA/MEDQ  D:  03/30/2009  T:  03/31/2009  Job:  [id number]

## 2010-12-15 ENCOUNTER — Inpatient Hospital Stay (HOSPITAL_COMMUNITY)
Admission: AD | Admit: 2010-12-15 | Discharge: 2011-01-01 | DRG: 100 | Disposition: A | Discharge status: Skilled Nursing Facility | Payer: Medicaid Other | Source: Other Acute Inpatient Hospital | Attending: Internal Medicine | Admitting: Internal Medicine

## 2010-12-15 DIAGNOSIS — E785 Hyperlipidemia, unspecified: Secondary | ICD-10-CM | POA: Diagnosis present

## 2010-12-15 DIAGNOSIS — I10 Essential (primary) hypertension: Secondary | ICD-10-CM | POA: Diagnosis present

## 2010-12-15 DIAGNOSIS — E876 Hypokalemia: Secondary | ICD-10-CM | POA: Diagnosis present

## 2010-12-15 DIAGNOSIS — I498 Other specified cardiac arrhythmias: Secondary | ICD-10-CM | POA: Diagnosis present

## 2010-12-15 DIAGNOSIS — J151 Pneumonia due to Pseudomonas: Secondary | ICD-10-CM | POA: Diagnosis not present

## 2010-12-15 DIAGNOSIS — M6282 Rhabdomyolysis: Secondary | ICD-10-CM | POA: Diagnosis present

## 2010-12-15 DIAGNOSIS — I69959 Hemiplegia and hemiparesis following unspecified cerebrovascular disease affecting unspecified side: Secondary | ICD-10-CM

## 2010-12-15 DIAGNOSIS — F17200 Nicotine dependence, unspecified, uncomplicated: Secondary | ICD-10-CM | POA: Diagnosis present

## 2010-12-15 DIAGNOSIS — Z8619 Personal history of other infectious and parasitic diseases: Secondary | ICD-10-CM

## 2010-12-15 DIAGNOSIS — G40401 Other generalized epilepsy and epileptic syndromes, not intractable, with status epilepticus: Principal | ICD-10-CM | POA: Diagnosis present

## 2010-12-15 DIAGNOSIS — J9600 Acute respiratory failure, unspecified whether with hypoxia or hypercapnia: Secondary | ICD-10-CM | POA: Diagnosis present

## 2010-12-15 DIAGNOSIS — R1310 Dysphagia, unspecified: Secondary | ICD-10-CM | POA: Diagnosis not present

## 2010-12-15 LAB — COMPREHENSIVE METABOLIC PANEL
ALT: 86 U/L — ABNORMAL HIGH (ref 0–53)
Albumin: 3.1 g/dL — ABNORMAL LOW (ref 3.5–5.2)
Alkaline Phosphatase: 49 U/L (ref 39–117)
Potassium: 3.1 mEq/L — ABNORMAL LOW (ref 3.5–5.1)
Sodium: 141 mEq/L (ref 135–145)
Total Protein: 7 g/dL (ref 6.0–8.3)

## 2010-12-15 LAB — CBC
Platelets: 166 10*3/uL (ref 150–400)
RDW: 13.3 % (ref 11.5–15.5)
WBC: 6.7 10*3/uL (ref 4.0–10.5)

## 2010-12-16 ENCOUNTER — Inpatient Hospital Stay (HOSPITAL_COMMUNITY): Payer: Medicaid Other

## 2010-12-16 LAB — CARDIAC PANEL(CRET KIN+CKTOT+MB+TROPI)
CK, MB: 4.2 ng/mL — ABNORMAL HIGH (ref 0.3–4.0)
CK, MB: 3.6 ng/mL (ref 0.3–4.0)
Relative Index: 0.1 (ref 0.0–2.5)
Total CK: 2620 U/L — ABNORMAL HIGH (ref 7–232)
Troponin I: <0.3 ng/mL (ref <0.30)

## 2010-12-16 LAB — COMPREHENSIVE METABOLIC PANEL
Alkaline Phosphatase: 47 U/L (ref 39–117)
BUN: 5 mg/dL — ABNORMAL LOW (ref 6–23)
CO2: 31 mEq/L (ref 19–32)
GFR calc non Af Amer: >60 mL/min (ref >60)
Glucose, Bld: 99 mg/dL (ref 70–99)
Potassium: 3.5 mEq/L (ref 3.5–5.1)
Total Bilirubin: 0.4 mg/dL (ref 0.3–1.2)
Total Protein: 6.7 g/dL (ref 6.0–8.3)

## 2010-12-16 LAB — URINE MICROSCOPIC-ADD ON

## 2010-12-16 LAB — HEMOGLOBIN A1C
Hgb A1c MFr Bld: 5.6 % (ref <5.7)
Mean Plasma Glucose: 114 mg/dL (ref <117)

## 2010-12-16 LAB — CBC
MCHC: 35.5 g/dL (ref 30.0–36.0)
MCV: 81.7 fL (ref 78.0–100.0)
Platelets: 152 10*3/uL (ref 150–400)
RDW: 13.2 % (ref 11.5–15.5)
WBC: 6.7 10*3/uL (ref 4.0–10.5)

## 2010-12-16 LAB — URINALYSIS, ROUTINE W REFLEX MICROSCOPIC
Glucose, UA: NEGATIVE mg/dL
Specific Gravity, Urine: 1.014 (ref 1.005–1.030)
Urobilinogen, UA: 0.2 mg/dL (ref 0.0–1.0)

## 2010-12-16 LAB — LIPID PANEL
Cholesterol: 141 mg/dL (ref 0–200)
LDL Cholesterol: 90 mg/dL (ref 0–99)

## 2010-12-16 NOTE — Consult Note (Addendum)
NAME:  TRISTAN, PROTO NO.:  [id number]  MEDICAL RECORD NO.:  [id number]           PATIENT TYPE:  I  LOCATION:  3111                         FACILITY:  [facility]  PHYSICIAN:  Mont Dutton, MD    DATE OF BIRTH:  [birth year]  DATE OF CONSULTATION:  12/15/2010 DATE OF DISCHARGE:                                CONSULTATION   REQUESTING PHYSICIAN:  [facility].  REASON FOR CONSULTATION:  Seizures.  HISTORY OF PRESENT ILLNESS:  Mr. Vandermeulen is a 55 year old African American male with a past medical history of seizure disorder, hypertension, previous stroke with residual left hemiparesis, hypertension, and hyperlipidemia, who was transferred from an outside hospital.  The records that were sent from the outside hospital are limited and no family is available for me to speak with.  Apparently, he resides in an assisted living facility and had an episode of generalized shaking followed by persistent alteration in mental status.  He was then sent to [facility].  For his seizure disorder, it appears that he takes Keppra 500 mg b.i.d. at home and it is not clear from the records if he takes Dilantin at home.  It is also not clear how often he has seizures at baseline.  At [facility], they increased the Keppra to 750 mg b.i.d.  They documented recurrent seizures that were not responsive to Ativan and, therefore, transferred him here.  The discharge summary states that he may have sepsis, but the details regarding this are unclear.  Since being transferred here, he has had 2 witnessed seizures.  These consist of head deviation to the left, left upper extremity elevation and shaking and the right lower extremity elevation.  These last approximately 30 seconds and he is sleepy afterwards.  The seizures are associated with bradycardia into the 50s.  His past mental status has been somnolent since he has been here.  He is able to state his name,  but is not able to say much, otherwise.  REVIEW OF SYSTEMS:  Fourteen-point review of systems was unable to be completed due to the patient's poor mental status.  PAST MEDICAL HISTORY: 1. Seizures. 2. Hypertension. 3. Stroke with residual left hemiparesis. 4. Hyperlipidemia. 5. Substance abuse. 6. Tobacco abuse.  HOME MEDICATIONS:  His home medications are unknown, but appears to include: 1. Keppra 500 mg b.i.d. for seizure control. 2. He may also be on Dilantin 100 mg t.i.d.  SOCIAL HISTORY:  Resides in assisted living facility.  Records document that he has a history of substance abuse.  FAMILY HISTORY:  Noncontributory.  PHYSICAL EXAMINATION:  GENERAL:  Appears comfortable, in no apparent distress.  He is somnolent. CARDIOVASCULAR:  Regular rate and rhythm. RESPIRATORY:  Clear to auscultation anteriorly. ABDOMEN:  Soft. SKIN:  No rashes or lesions. MENTAL STATUS:  He is somewhat.  He opens his eyes to noxious stimuli. He is able to tell me his name and age, but unable to tell me the location or the year.  He follows commands poorly as he continues to fall back asleep. CRANIAL NERVES:  Pupils are equal, round, and reactive to light, right gaze preference, left gaze  palsy, left facial droop, tongue midline. MOTOR:  Increased tone in the left upper and left lower extremity. There is no movement on the left side.  He elevates his right upper extremity and lower extremity against gravity. DEEP TENDON REFLEXES:  He is hyperreflexic on the left with an upgoing toe on the left. COORDINATION:  Unable to assess. SENSATION:  Unable to assess.  LABORATORY DATA:  Not obtained yet.  IMAGING:  Head CT reported at the outside hospital was negative, but the official report was not sent, neither was a disc.  ASSESSMENT AND PLAN:  Mr. Radi is a 55 year old male with a history of stroke with residual left hemiparesis and seizure disorder, who appears to be having increasing  seizures, possibly secondary to an infection.  PLAN: 1. Increase Keppra to 1500 mg b.i.d. with the first dose given now. 2. Check a Dilantin level now. 3. If any recurrent seizures occur after this higher dose of Keppra is     given, then we will plan on loading fosphenytoin. 4. Obtain a noncontrast head CT to rule out any acute process. 5. No indication for an EEG at this time given that he is able to     answer questions in between seizures and, therefore, suspicion of     nonconvulsive seizures is quite low.  Addendum: Dilantin level was 6.2, therefore we will resume Dilantin 100mg  IV Q8H.   To get his level theraputic, Fosphenytoin 500mg  IV x1 will be given.     Thank you very much for this consultation.          ______________________________ Mont Dutton, MD     CS/MEDQ  D:  12/15/2010  T:  12/16/2010  Job:  [id number]  Electronically Signed by Mont Dutton MD on 12/16/2010 06:55:47 AM

## 2010-12-17 ENCOUNTER — Inpatient Hospital Stay (HOSPITAL_COMMUNITY): Payer: Medicaid Other

## 2010-12-17 DIAGNOSIS — J9600 Acute respiratory failure, unspecified whether with hypoxia or hypercapnia: Secondary | ICD-10-CM

## 2010-12-17 DIAGNOSIS — Z9911 Dependence on respirator [ventilator] status: Secondary | ICD-10-CM

## 2010-12-17 DIAGNOSIS — G40219 Localization-related (focal) (partial) symptomatic epilepsy and epileptic syndromes with complex partial seizures, intractable, without status epilepticus: Secondary | ICD-10-CM

## 2010-12-17 DIAGNOSIS — I10 Essential (primary) hypertension: Secondary | ICD-10-CM

## 2010-12-17 DIAGNOSIS — R4182 Altered mental status, unspecified: Secondary | ICD-10-CM

## 2010-12-17 LAB — BASIC METABOLIC PANEL
Chloride: 102 mEq/L (ref 96–112)
GFR calc Af Amer: >60 mL/min (ref >60)
Potassium: 3.4 mEq/L — ABNORMAL LOW (ref 3.5–5.1)
Sodium: 140 mEq/L (ref 135–145)

## 2010-12-17 LAB — URINE CULTURE: Culture  Setup Time: 201205201144

## 2010-12-17 LAB — PROTIME-INR
INR: 1.12 (ref 0.00–1.49)
Prothrombin Time: 14.6 seconds (ref 11.6–15.2)

## 2010-12-17 LAB — APTT: aPTT: 32 seconds (ref 24–37)

## 2010-12-17 LAB — MAGNESIUM: Magnesium: 1.5 mg/dL (ref 1.5–2.5)

## 2010-12-17 LAB — PHOSPHORUS: Phosphorus: 2.9 mg/dL (ref 2.3–4.6)

## 2010-12-17 LAB — CK TOTAL AND CKMB (NOT AT ARMC): Relative Index: 0.1 (ref 0.0–2.5)

## 2010-12-18 ENCOUNTER — Inpatient Hospital Stay (HOSPITAL_COMMUNITY): Payer: Medicaid Other

## 2010-12-18 LAB — COMPREHENSIVE METABOLIC PANEL
ALT: 64 U/L — ABNORMAL HIGH (ref 0–53)
AST: 80 U/L — ABNORMAL HIGH (ref 0–37)
Alkaline Phosphatase: 46 U/L (ref 39–117)
CO2: 25 mEq/L (ref 19–32)
Chloride: 106 mEq/L (ref 96–112)
GFR calc Af Amer: >60 mL/min (ref >60)
GFR calc non Af Amer: >60 mL/min (ref >60)
Glucose, Bld: 121 mg/dL — ABNORMAL HIGH (ref 70–99)
Potassium: 3.3 mEq/L — ABNORMAL LOW (ref 3.5–5.1)
Sodium: 140 mEq/L (ref 135–145)
Total Bilirubin: 0.4 mg/dL (ref 0.3–1.2)

## 2010-12-18 LAB — CBC
HCT: 30.3 % — ABNORMAL LOW (ref 39.0–52.0)
Hemoglobin: 10.8 g/dL — ABNORMAL LOW (ref 13.0–17.0)
MCH: 29.1 pg (ref 26.0–34.0)
MCHC: 35.6 g/dL (ref 30.0–36.0)
MCV: 81.7 fL (ref 78.0–100.0)
RDW: 13.4 % (ref 11.5–15.5)

## 2010-12-18 LAB — BLOOD GAS, ARTERIAL
Acid-Base Excess: 0.7 mmol/L (ref 0.0–2.0)
Bicarbonate: 24.8 mEq/L — ABNORMAL HIGH (ref 20.0–24.0)
FIO2: 0.4 %
O2 Saturation: 97.8 %
PEEP: 5 cmH2O
Patient temperature: 99.9
RATE: 14 resp/min

## 2010-12-18 LAB — DIFFERENTIAL
Basophils Absolute: 0 10*3/uL (ref 0.0–0.1)
Eosinophils Relative: 0 % (ref 0–5)
Lymphocytes Relative: 51 % — ABNORMAL HIGH (ref 12–46)
Monocytes Absolute: 0.8 10*3/uL (ref 0.1–1.0)
Monocytes Relative: 13 % — ABNORMAL HIGH (ref 3–12)

## 2010-12-18 LAB — PHENYTOIN LEVEL, TOTAL: Phenytoin Lvl: 13.1 ug/mL (ref 10.0–20.0)

## 2010-12-18 LAB — CARDIAC PANEL(CRET KIN+CKTOT+MB+TROPI): Total CK: 1178 U/L — ABNORMAL HIGH (ref 7–232)

## 2010-12-19 ENCOUNTER — Inpatient Hospital Stay (HOSPITAL_COMMUNITY): Payer: Medicaid Other

## 2010-12-19 LAB — CBC
Hemoglobin: 10.8 g/dL — ABNORMAL LOW (ref 13.0–17.0)
MCH: 29.3 pg (ref 26.0–34.0)
MCHC: 35.6 g/dL (ref 30.0–36.0)
RDW: 13.5 % (ref 11.5–15.5)

## 2010-12-19 LAB — BASIC METABOLIC PANEL
CO2: 23 mEq/L (ref 19–32)
Calcium: 7.7 mg/dL — ABNORMAL LOW (ref 8.4–10.5)
Creatinine, Ser: 0.71 mg/dL (ref 0.4–1.5)
GFR calc Af Amer: >60 mL/min (ref >60)
GFR calc non Af Amer: >60 mL/min (ref >60)
Sodium: 142 mEq/L (ref 135–145)

## 2010-12-19 NOTE — Procedures (Unsigned)
EEG NUMBER:  REFERRING PHYSICIAN:  Merwyn Katos MD  HISTORY:  A 55 year old man who developed seizures after a stroke.  He is admitted with status epilepticus, and the current EEG performed when he is intubated and on sedation with propofol.  During this EEG, no clinical seizure activity was recorded.  However, the patient was unresponsive.  MEDICATIONS:  He is on Dilantin, Vimpat, Keppra, and right before the EEG propofol was stopped.  EEG duration 22 minutes of EEG recording.  EEG INTERPRETATION:  This is a routine 18-channel adult EEG recording with one channel devoted to limited EKG recording.  Activation was performed during the photic stimulation and the study does not reflect sleep versus awake state.  As the EEG opens up, I noticed that there is no obvious location where a clear posterior dominant rhythm, however, the background rhythm consist of high delta to low theta ranges 5 Hz at the most.  The rhythm is somewhat asymmetrical with a relatively well-formed on the left side as compared to the right side.  No driving was noted with photic stimulation in the posterior leads.  The EEG does not reflect any change in the state, sleep versus awake.  However, the EEG is reactive without any epileptiform discharges or electrographic seizures recorded during this study.  EEG INTERPRETATION:  This is an abnormal EEG: 1. Due to the presence of generalized slowing into low theta ranges     that is reactive. 2. Also, abnormal because of asymmetry with relatively well-formed     background rhythm on the left hemisphere tracing as compared to the     poorly formed right hemisphere tracings. 3. However, there is no electrographic seizures or epileptiform     discharges recorded during this study.  CLINICAL NOTE:  This degree of slowing with reactivity signifies a moderate degree of nonspecific encephalopathy and can be seen in a variety of causes, including postictal phase or  the effects of sedating medications.  The poorly formed right hemisphere tracing does correlate with the patient's known history of this stroke.  However, there is no electrographic seizures or epileptiform discharges recorded on the study.         ______________________________ Levie Heritage, MD   [id number] D:  12/18/2010 14:26:38  T:  12/19/2010 04:06:50  Job #:  [id number]

## 2010-12-20 LAB — URINALYSIS, ROUTINE W REFLEX MICROSCOPIC
Bilirubin Urine: NEGATIVE
Glucose, UA: NEGATIVE mg/dL
Ketones, ur: NEGATIVE mg/dL
Protein, ur: NEGATIVE mg/dL
Urobilinogen, UA: 1 mg/dL (ref 0.0–1.0)

## 2010-12-20 LAB — BLOOD GAS, ARTERIAL
Bicarbonate: 24.8 mEq/L — ABNORMAL HIGH (ref 20.0–24.0)
Expiratory PAP: 5
FIO2: 0.3 %
O2 Saturation: 98 %
Patient temperature: 98.6
TCO2: 26 mmol/L (ref 0–100)
pH, Arterial: 7.442 (ref 7.350–7.450)

## 2010-12-20 LAB — BASIC METABOLIC PANEL
BUN: 4 mg/dL — ABNORMAL LOW (ref 6–23)
Calcium: 7.9 mg/dL — ABNORMAL LOW (ref 8.4–10.5)
GFR calc non Af Amer: >60 mL/min (ref >60)
Glucose, Bld: 133 mg/dL — ABNORMAL HIGH (ref 70–99)
Potassium: 3.2 mEq/L — ABNORMAL LOW (ref 3.5–5.1)
Sodium: 142 mEq/L (ref 135–145)

## 2010-12-21 ENCOUNTER — Inpatient Hospital Stay (HOSPITAL_COMMUNITY): Payer: Medicaid Other

## 2010-12-21 LAB — BASIC METABOLIC PANEL
BUN: 4 mg/dL — ABNORMAL LOW (ref 6–23)
GFR calc Af Amer: >60 mL/min (ref >60)
GFR calc non Af Amer: >60 mL/min (ref >60)

## 2010-12-21 LAB — CBC
MCH: 29.9 pg (ref 26.0–34.0)
MCHC: 36.1 g/dL — ABNORMAL HIGH (ref 30.0–36.0)
Platelets: 148 10*3/uL — ABNORMAL LOW (ref 150–400)
RBC: 3.64 MIL/uL — ABNORMAL LOW (ref 4.22–5.81)
RDW: 13.9 % (ref 11.5–15.5)

## 2010-12-21 LAB — PHENYTOIN LEVEL, TOTAL: Phenytoin Lvl: 10.6 ug/mL (ref 10.0–20.0)

## 2010-12-23 ENCOUNTER — Inpatient Hospital Stay (HOSPITAL_COMMUNITY): Payer: Medicaid Other

## 2010-12-23 LAB — CULTURE, RESPIRATORY W GRAM STAIN

## 2010-12-24 LAB — CBC
HCT: 31.8 % — ABNORMAL LOW (ref 39.0–52.0)
MCHC: 36.2 g/dL — ABNORMAL HIGH (ref 30.0–36.0)
Platelets: 220 10*3/uL (ref 150–400)
RDW: 13.8 % (ref 11.5–15.5)

## 2010-12-24 LAB — MAGNESIUM: Magnesium: 1.9 mg/dL (ref 1.5–2.5)

## 2010-12-24 LAB — COMPREHENSIVE METABOLIC PANEL
ALT: 91 U/L — ABNORMAL HIGH (ref 0–53)
Albumin: 2.6 g/dL — ABNORMAL LOW (ref 3.5–5.2)
Calcium: 8.6 mg/dL (ref 8.4–10.5)
Glucose, Bld: 95 mg/dL (ref 70–99)
Sodium: 139 mEq/L (ref 135–145)
Total Protein: 6.6 g/dL (ref 6.0–8.3)

## 2010-12-24 LAB — CK: Total CK: 224 U/L (ref 7–232)

## 2010-12-24 NOTE — H&P (Signed)
NAME:  Chad Mcclure, Chad Mcclure NO.:  [id number]  MEDICAL RECORD NO.:  [id number]           PATIENT TYPE:  I  LOCATION:  3111                         FACILITY:  [facility]  PHYSICIAN:  Brendia Sacks, MD    DATE OF BIRTH:  30-Jan-1956  DATE OF ADMISSION:  12/15/2010 DATE OF DISCHARGE:                             HISTORY & PHYSICAL   REFERRING PHYSICIAN:  Dr. Darcel Bayley at [facility].  This is a transfer from [facility] for Neurology consultation.  REASON FOR TRANSFER:  Neurology consultation secondary to recurrent seizures.  CHIEF COMPLAINT:  Seizures.  HISTORY OF PRESENT ILLNESS:  This is a 55 year old male with a known history of seizure disorder and multiple strokes who was admitted to [facility] on Dec 11, 2010 from an assisted living facility.  Dr. Darcel Bayley contacted me today as there is no neurology coverage currently available there.  He felt that the patient required neurology consultation secondary to recurrent seizures.  In report Dr. Darcel Bayley noted that the patient had been admitted for seizures and had continued to have intermittent brief seizures lasting usually less than 60 seconds described as lasting mostly 30-45 seconds during the course of his hospitalization including today.  He was described as being easily arousable after this and following commands.  He was described to have chronic left hemiparesis and no new neurologic deficits.  He was noted to have had an EEG which showed global encephalopathy but no specific evidence of seizure.  He was noted to have rhabdomyolysis with a CK of 9000 which have been trending down to 4000, troponin peaked to 0.5.  No chest pain and no EKG changes.  CT of the head was described as being negative.  The patient describes being hemodynamically stable with a temperature of 98.9, pulse 69, respirations 18, blood pressure 136/90. He was given Keppra 500 b.i.d. after a 1 gram loading dose and  placed on Dilantin 100 mg t.i.d.  Based on his hemodynamic stability and his arousability, step-down bed was felt to be appropriate for seizures and possible status epilepticus.  He had several seizures en route and although he remained hemodynamically stable, he was upgraded to ICU status.  The patient arrived on the floor and was immediately evaluated.  He has had 2 seizures which consist of left arm, gentle movement, and jerking and days to look.  He then becomes somnolent afterwards for a brief period of time and then is arousable and able to answer questions.  Review of the patient's history and physical notes some additional data previously unknown and that the patient was apparently admitted for severe sepsis which was thought to possibly be secondary to urinary tract infection, although he was noted to be nitrite and leukocyte esterase negative with 0-2 white blood cells.  He was also noted to be hemodynamically stable.  He was treated with Rocephin currently for 3 days and then stopped with repeat culture planned.  His seizures were treated as above with IV loading dose of Keppra and then maintenance as well as Ativan and Dilantin, this is dated Dec 11, 2010.  There is an additional progress note dated Dec 13, 2010 included in the records which notes that does not comment on the severe sepsis except noticed present.  Urine culture was not sent at the time of admission.  He had received antibiotics prior to culture being obtained.  Repeat UA and culture was planned on the date of this progress note.  His prolactin level was noted to be normal.  His EEG was noted to be markedly abnormal and will be described below.  Note there is no transfer summary including the paperwork, no EKG, and laboratory studies are included only from admission.  There is also an abdominal ultrasound which will be described below.  REVIEW OF SYSTEMS:  Unobtainable from the patient.  PAST MEDICAL  HISTORY:  The patient has been seen here in the past in the [facility] and information has been gleaned from the record. 1. Hepatitis C. 2. Multiple strokes with left-sided hemiparesis particularly with     contraction of his left hand and fingers, although he apparently     had some preserved strength in his triceps and deltoid. 3. Seizure disorder. 4. Polysubstance abuse is noted in the chart. 5. Hypertension. 6. Hyperlipidemia. 7. Multiple strokes including September 2010 and December 2010.  PAST SURGICAL HISTORY:  Hernia repair.  SOCIAL HISTORY:  He lives in assisted living facility.  He smokes a pack per day.  FAMILY HISTORY:  Mother with stroke and diabetes.  Father with hypertension.  ALLERGIES:  None.  Medications at time of transfer is somewhat uncertain, although there is an included list dated today, Dec 15, 2010, which includes: 1. Zantac 150 mg p.o. b.i.d. 2. Cleocin 300 mg every 6 hours. 3. Loratadine 10 mg p.o. daily. 4. Prinivil 20 mg p.o. daily. 5. Hydrochlorothiazide 25 mg p.o. daily. 6. Aspirin 81 mg p.o. daily. 7. Celexa 20 mg p.o. daily. 8. Lopressor 25 mg p.o. b.i.d. 9. Lovenox 40 mg every 24 hours. 10.Dilantin 100 mg p.o. t.i.d. 11.Lactulose 20 grams p.o. b.i.d. 12.Keppra 750 mg p.o. b.i.d. and p.r.n. medications including Tylenol,     Zofran, Ambien, lorazepam, and Apresoline.  PHYSICAL EXAMINATION:  VITAL SIGNS:  Temperature 98.8, pulse 71, respirations 15, blood pressure is stable. GENERAL:  Well-developed, well-nourished man in no acute distress.  He had a brief episode of seizure activity as I walked in the room and then was somnolent for a very brief period of time. HEAD:  Appears to be normal. EYES:  Sclerae clear.  Pupils are equal, round, and reactive to light. 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  Lids, irises, conjunctivae appear unremarkable.  ENT:  Hearing is grossly normal.  Lips and tongue appear  unremarkable.  He opens his mouth approximately a centimeter. NECK:  Supple.  No lymphadenopathy or masses.  No thyromegaly. CHEST:  Clear to auscultation bilaterally.  No wheezes, rales, or rhonchi.  There is normal respiratory effort. CARDIOVASCULAR:  Regular rate and rhythm.  No murmur, rub, or gallop. No lower extremity edema. ABDOMEN:  Soft, nontender, nondistended.  No masses are appreciated. SKIN:  Normal without rash or indurations.  Nontender to palpation. NEUROLOGIC:  Tone and strength in the right upper and the right lower extremity is 5/5 and symmetric.  The left upper extremity, he is able to move with approximately 3-4 strength in his left upper arm.  His left hand shows a contracture and he does not move this.  His left lower extremity, he is able to move to command, but does appear to be weak.  Review  of previous neurology consultation and exams demonstrate that his left upper extremity weakness appears to be at baseline.  His strabismus has been noted in the past.  His left hand contracture has been noted in the past.  Psychiatric cannot assess.  He is alert and oriented to his name.  He is not oriented to time or place.  IMAGING STUDIES FROM OUTSIDE FACILITIES: 1. Abdominal ultrasound report limited secondary to bowel gas.     Possible questionable mild intrahepatic biliary ductal dilatation     without evidence of extrahepatic biliary ductal dilatation. 2. EEG report, abnormal intense electroencephalogram dated Dec 13, 2010, global dysfunction of central nervous system with     encephalopathy, generalized grand mal seizure as well as     localization related seizure at the left parietal lobe origin.  LABORATORY STUDIES:  None from today included.  We will be obtaining stat laboratory studies.  ASSESSMENT AND PLAN:  A 55 year old man transferred from [facility] for recurrent seizures. 1. Recurrent seizures.  He is currently on Keppra 750 mg p.o.  b.i.d.     and Dilantin 100 mg p.o. t.i.d.  The patient is hemodynamically     stable.  His seizure activity is brief.  He is maintaining his     airway and is able to follow commands.  I have discussed the case     with Neurology who will kindly provide consultation tonight and     further recommendations.  Recommendations for his medications goes     to Neurology and would consider further head imaging as per them.     Etiology with seizures may be secondary to noncompliance.  I note     that he has been on Rocephin which can lower his seizure threshold.     He has also been on Celexa which will be held at this time.  He has     also been on lactulose for unclear reasons. 2. Possible urinary tract infection.  The urinalysis was negative for     nitrites, leukocyte esterase, and had an unimpressive amount of     white blood cells.  As he is afebrile and hemodynamically stable,     we would hold off on any antibiotics as apparently, the plan was at     [facility]. 3. Rhabdomyolysis presumably secondary to seizure activity with     decreasing CK.  We will check stat laboratory studies. 4. Minimal troponin elevation as reported.  No EKG report as we will     obtain baseline study here.  This was felt to be secondary to     rhabdomyolysis as the patient was completely asymptomatic and by     report, had no EKG changes. 5. History of multiple cerebrovascular accidents, hepatitis C,     polysubstance abuse, and left hemiparesis. 6. Full code.     Brendia Sacks, MD     DG/MEDQ  D:  12/15/2010  T:  12/15/2010  Job:  [id number]  Electronically Signed by Brendia Sacks  on 12/24/2010 04:56:40 PM

## 2010-12-25 LAB — COMPREHENSIVE METABOLIC PANEL
ALT: 112 U/L — ABNORMAL HIGH (ref 0–53)
AST: 107 U/L — ABNORMAL HIGH (ref 0–37)
CO2: 29 mEq/L (ref 19–32)
Chloride: 103 mEq/L (ref 96–112)
GFR calc Af Amer: >60 mL/min (ref >60)
GFR calc non Af Amer: >60 mL/min (ref >60)
Sodium: 140 mEq/L (ref 135–145)
Total Bilirubin: 0.3 mg/dL (ref 0.3–1.2)

## 2010-12-25 LAB — CBC
Hemoglobin: 11.9 g/dL — ABNORMAL LOW (ref 13.0–17.0)
MCH: 29.3 pg (ref 26.0–34.0)
RBC: 4.06 MIL/uL — ABNORMAL LOW (ref 4.22–5.81)

## 2010-12-26 ENCOUNTER — Inpatient Hospital Stay (HOSPITAL_COMMUNITY): Payer: Medicaid Other

## 2010-12-26 LAB — PROTIME-INR: Prothrombin Time: 13.8 seconds (ref 11.6–15.2)

## 2010-12-26 LAB — CBC
MCH: 29.5 pg (ref 26.0–34.0)
MCHC: 35.7 g/dL (ref 30.0–36.0)
Platelets: 247 10*3/uL (ref 150–400)

## 2010-12-26 LAB — BASIC METABOLIC PANEL
Calcium: 9.2 mg/dL (ref 8.4–10.5)
Creatinine, Ser: 0.64 mg/dL (ref 0.4–1.5)
GFR calc non Af Amer: >60 mL/min (ref >60)
Glucose, Bld: 91 mg/dL (ref 70–99)
Sodium: 138 mEq/L (ref 135–145)

## 2010-12-26 LAB — HEPATIC FUNCTION PANEL
AST: 99 U/L — ABNORMAL HIGH (ref 0–37)
Bilirubin, Direct: <0.1 mg/dL (ref 0.0–0.3)
Total Bilirubin: 0.3 mg/dL (ref 0.3–1.2)

## 2010-12-26 LAB — AMMONIA: Ammonia: 56 umol/L (ref 11–60)

## 2010-12-27 ENCOUNTER — Inpatient Hospital Stay (HOSPITAL_COMMUNITY): Payer: Medicaid Other

## 2010-12-27 LAB — COMPREHENSIVE METABOLIC PANEL
Albumin: 3.3 g/dL — ABNORMAL LOW (ref 3.5–5.2)
BUN: 11 mg/dL (ref 6–23)
CO2: 31 mEq/L (ref 19–32)
Chloride: 100 mEq/L (ref 96–112)
Creatinine, Ser: 0.76 mg/dL (ref 0.4–1.5)
GFR calc non Af Amer: >60 mL/min (ref >60)
Glucose, Bld: 92 mg/dL (ref 70–99)
Total Bilirubin: 0.3 mg/dL (ref 0.3–1.2)

## 2010-12-28 ENCOUNTER — Inpatient Hospital Stay (HOSPITAL_COMMUNITY): Payer: Medicaid Other

## 2010-12-28 DIAGNOSIS — R579 Shock, unspecified: Secondary | ICD-10-CM

## 2010-12-28 DIAGNOSIS — G40309 Generalized idiopathic epilepsy and epileptic syndromes, not intractable, without status epilepticus: Secondary | ICD-10-CM

## 2010-12-28 LAB — CBC
HCT: 34.4 % — ABNORMAL LOW (ref 39.0–52.0)
Hemoglobin: 12.3 g/dL — ABNORMAL LOW (ref 13.0–17.0)
MCHC: 35.8 g/dL (ref 30.0–36.0)
MCV: 82.7 fL (ref 78.0–100.0)
WBC: 6.7 10*3/uL (ref 4.0–10.5)

## 2010-12-28 LAB — COMPREHENSIVE METABOLIC PANEL
ALT: 135 U/L — ABNORMAL HIGH (ref 0–53)
Alkaline Phosphatase: 88 U/L (ref 39–117)
BUN: 9 mg/dL (ref 6–23)
CO2: 28 mEq/L (ref 19–32)
GFR calc non Af Amer: >60 mL/min (ref >60)
Glucose, Bld: 117 mg/dL — ABNORMAL HIGH (ref 70–99)
Potassium: 3.5 mEq/L (ref 3.5–5.1)
Sodium: 137 mEq/L (ref 135–145)

## 2010-12-28 LAB — POCT I-STAT 3, ART BLOOD GAS (G3+)
Acid-Base Excess: 4 mmol/L — ABNORMAL HIGH (ref 0.0–2.0)
Bicarbonate: 26.7 mEq/L — ABNORMAL HIGH (ref 20.0–24.0)
TCO2: 28 mmol/L (ref 0–100)
pO2, Arterial: 82 mmHg (ref 80.0–100.0)

## 2010-12-28 LAB — CK: Total CK: 280 U/L — ABNORMAL HIGH (ref 7–232)

## 2010-12-28 LAB — PHENYTOIN LEVEL, TOTAL
Phenytoin Lvl: 3.5 ug/mL — ABNORMAL LOW (ref 10.0–20.0)
Phenytoin Lvl: 14.2 ug/mL (ref 10.0–20.0)

## 2010-12-28 LAB — GLUCOSE, CAPILLARY: Glucose-Capillary: 111 mg/dL — ABNORMAL HIGH (ref 70–99)

## 2010-12-29 DIAGNOSIS — G40309 Generalized idiopathic epilepsy and epileptic syndromes, not intractable, without status epilepticus: Secondary | ICD-10-CM

## 2010-12-29 DIAGNOSIS — R579 Shock, unspecified: Secondary | ICD-10-CM

## 2010-12-29 DIAGNOSIS — R4182 Altered mental status, unspecified: Secondary | ICD-10-CM

## 2010-12-29 LAB — BASIC METABOLIC PANEL
BUN: 6 mg/dL (ref 6–23)
Calcium: 8.8 mg/dL (ref 8.4–10.5)
Creatinine, Ser: 0.76 mg/dL (ref 0.4–1.5)
GFR calc non Af Amer: >60 mL/min (ref >60)
Glucose, Bld: 83 mg/dL (ref 70–99)

## 2010-12-29 LAB — CBC
HCT: 33.9 % — ABNORMAL LOW (ref 39.0–52.0)
MCH: 29.6 pg (ref 26.0–34.0)
MCHC: 35.4 g/dL (ref 30.0–36.0)
MCV: 83.5 fL (ref 78.0–100.0)
RDW: 13.9 % (ref 11.5–15.5)

## 2010-12-30 LAB — CULTURE, BLOOD (ROUTINE X 2)
Culture  Setup Time: 201205281946
Culture: NO GROWTH

## 2010-12-30 LAB — PHENYTOIN LEVEL, TOTAL: Phenytoin Lvl: 13.5 ug/mL (ref 10.0–20.0)

## 2010-12-31 NOTE — Discharge Summary (Signed)
NAME:  Chad Mcclure, Chad Mcclure NO.:  [id number]  MEDICAL RECORD NO.:  [id number]           PATIENT TYPE:  I  LOCATION:  3005                         FACILITY:  [facility]  PHYSICIAN:  Conley Canal, MD      DATE OF BIRTH:  16-Nov-1955  DATE OF ADMISSION:  12/15/2010 DATE OF DISCHARGE:                        DISCHARGE SUMMARY - REFERRING   CONSULTING PHYSICIAN:  Mont Dutton, MD with Neurology.  PROBLEM LIST: 1. Status epilepticus resolved. 2. Possible Pseudomonas sepsis with positive respiratory cultures. 3. Rhabdomyolysis resolved. 4. History of CVA, hepatitis C, polysubstance abuse.  PROCEDURES PERFORMED: 1. Status post endotracheal intubation for airway protection. 2. Multiple chest x-rays last one on May 27 showing some pulmonary     vascular congestion and cardiomegaly.  No segmental consolidation     or gross hemothorax.  HOSPITAL COURSE:  Chad Mcclure was admitted in transfer from [facility] on May 19 for status epilepticus.  He had been admitted at [facility] on May 15 with history of seizures as well as suspicion of UTI.  He did not improve despite increases in Keppra. Hence transferred to the Intensive Care Unit for further management by Neurology.  Neurology has been on board since admission and they have adjusted his medications and he is carotid currently on Tegretol, Keppra, and Dilantin and his seizures have since resolved.  However, he remains quite lethargic.  He was extubated on May 25 and transferred to the Hospitalist Service for further management.  His respiratory cultures sent on May 24 grew Pseudomonas aeruginosa.  He had also had a fever around that time and he was hence started on cefepime and ciprofloxacin for double gram-negative coverage.  He seems to be a little stronger today compared to yesterday, although I am not sure if he understands what is happening around him.  Today he denies any complaints.  PHYSICAL  EXAMINATION:  VITALS:  Blood pressure 149/87, heart rate is 71, temperature 98.1, respirations 16, oxygen saturation 100% on 2 liters nasal cannula. HEAD, EARS, NOSE AND THROAT:  Pupils equal, reacting to light.  No jugular venous distention.  No carotid bruits. RESPIRATORY SYSTEM:  Bilateral air entry with no rhonchi, rales, or wheezes. CARDIOVASCULAR SYSTEM:  First and second heart sounds heard.  No murmurs.  Pulse regular. ABDOMEN:  Scaphoid, soft, nontender.  No palpable organomegaly.  Bowel sounds normal. CNS:  The patient is alert, responds to questions but limited conversation, follows commands.  No focal deficits. EXTREMITIES:  No pedal edema.  Peripheral pulses equal.  Labs were reviewed, significant for WBC 6, hemoglobin 11.9, hematocrit 33.5, platelet count 235,000.  Sodium 140, potassium 3.7, BUN 6, creatinine 0.63.  AST 107, ALT 112, magnesium 1.9, phosphorus 3.9. Blood cultures on May 28 showing no growth so far.  PLAN: 1. Status epilepticus resolved now on 3 anticonvulsants.  Appreciate     Neurology input.  Assume he will need to continue the 3     anticonvulsants, however, his liver function seems to be worsening     and wonder if this will require change of medications. 2. Pseudomonas aeruginosa infection, origin of source probably lungs  versus urinary tract.  The patient was in an assisted living     facility and he has also been hospitalized since May 15.  He seems     to be responding to cefepime and ciprofloxacin.  Would continue     same and may be eventually transitioned to ciprofloxacin alone to     complete 7-10 days.  We would also follow blood cultures sent on     May 28. 3. Hypertension.  The patient on hydrochlorothiazide, lisinopril.     Blood pressure not optimally controlled.  We will increase     lisinopril to 30 mg daily. 4. Depression.  Continue Celexa. 5. Hyperlipidemia.  The patient on Crestor.  Continue the same.  DISPOSITION:  The  patient will definitely need some form of rehabilitation or ask inpatient rehab to consult.  Otherwise, he will need skilled nursing facility placement.     Conley Canal, MD     SR/MEDQ  D:  12/25/2010  T:  12/25/2010  Job:  [id number]  Electronically Signed by Conley Canal  on 12/31/2010 05:41:22 PM

## 2011-01-01 LAB — BASIC METABOLIC PANEL
BUN: <3 mg/dL — ABNORMAL LOW (ref 6–23)
CO2: 28 mEq/L (ref 19–32)
Chloride: 103 mEq/L (ref 96–112)
Glucose, Bld: 92 mg/dL (ref 70–99)
Potassium: 3.1 mEq/L — ABNORMAL LOW (ref 3.5–5.1)

## 2011-01-01 LAB — CK: Total CK: 400 U/L — ABNORMAL HIGH (ref 7–232)

## 2011-02-14 NOTE — Discharge Summary (Signed)
NAME:  Chad Mcclure, Chad Mcclure NO.:  [id number]  MEDICAL RECORD NO.:  [id number]  LOCATION:  3112                         FACILITY:  [facility]  PHYSICIAN:  Calvert Cantor, M.D.     DATE OF BIRTH:  1955-08-15  DATE OF ADMISSION:  12/15/2010 DATE OF DISCHARGE:  01/01/2011                              DISCHARGE SUMMARY   ADDENDUM  PRIMARY CARE PHYSICIAN:  Unknown.  The patient is not going to a nursing facility where he should have a PCP.  This dictation encompasses the time between Dec 26, 2010, and today January 01, 2011.  Please see discharge summary that was dictated by Dr. Venetia Constable on Dec 25, 2010, job number [id number].  DISCHARGE ON DIAGNOSES: 1. Status epilepticus on admission followed by recurrent seizures     during hospital stay.  These were focal with secondary generalized     seizures. 2. Positive sputum cultures for Pseudomonas without any obvious     pulmonary infiltrates.  The patient has been treated for 10 days     based on the sensitivities. 3. Rhabdomyolysis secondary to seizures, now resolved.  PAST MEDICAL HISTORY: 1. Multiple CVAs in the past resulting with left-sided hemiparesis.     The patient is now left with contracture of his left hand and     fingers. 2. Hepatitis C. 3. Polysubstance abuse. 4. Hypertension. 5. Hyperlipidemia.  PAST SURGICAL HISTORY:  Hernia repair.  The patient was previously living in an assisted living facility prior to being transferred to the hospital on Dec 15, 2010.  DISCHARGE MEDICATIONS:  New medications are as follows. 1. Vimpat 100 mg twice a day. 2. Lisinopril 20 mg daily.  This was added to his current     lisinopril/HCTZ due to uncontrolled blood pressures. 3. Rosuvastatin 20 mg daily.  Following medications have been adjusted Phenytoin has been increased from 100 mg p.o. t.i.d. to 150 mg in the a.m. and at lunch time and 100 mg at bedtime.  Continue all other meds as follows. 1. Keppra 750 mg twice a day. 2.  Aspirin 81 mg daily. 3. Celexa 20 mg daily. 4. Lisinopril/HCTZ 20/12.5 one tablet daily. 5. Lorazepam 1 mg by mouth twice a day as needed for anxiety. 6. Naprosyn 500 mg twice a day as needed for pain. 7. Ranitidine 150 mg daily.  Stop the following medication, clindamycin.  We have also started Ensure Immune Strawberry twice a day with meals due to a noted poor appetite.  HOSPITAL COURSE:  Prior to May 29, please see Dr. Marcene Duos dictation for hospital course.  Subsequent to this date, I began to take care of the patient.  He was quite stable from both neurological and respiratory standpoint and had progressed well.  He was evaluated by physical therapy and it was decided that he would need a skilled nursing facility at this point.  We were awaiting a bed at the nursing facility when subsequently the patient developed seizures once again.  This occurred on early morning of June 1.  These were initially focal and subsequently generalized.  He was treated with Ativan in high dosages and it was noted that his Dilantin level had dropped.  Neurology had been following the  patient and evaluated him once again after the seizures.  It was noted that he had been started on carbamazepine by a different neurologist and it is suspected that this was the reason for the drop in Dilantin levels due to interactions between carbamazepine and Dilantin. At this point, it was recommended that carbamazepine be discontinued. Dilantin was reloaded and levels were brought up to therapeutic.  He has been therapeutic on the current dosage that he is being discharged on. In addition, Vimpat has been added and Keppra is being continued.  He has had no further seizures.  Of note, the patient does have a prolonged postictal state after his seizures.  At times he is lethargic and at times he becomes agitated and confused.  At this time his postictal state lasted about 2 days.  The patient did subsequently  developed shock after his seizures on June 1.  This was thought to be secondary to high doses of Ativan and also the phenytoin IV load.  He did require dopamine for a short period but this was discontinued and he has been doing quite well without any pressors.  In fact, blood pressure has gone up to his normal and he is back on his antihypertensives.  Regarding his sputum culture that was positive for Pseudomonas, I have noted that he never actually had infiltrates on any of his chest x-rays. In addition, he was not noted to have positive urine cultures or blood cultures.  At this time, I am unsure of Pseudomonas were simply a colonization or whether he may have had a bronchitis secondary to Pseudomonas.  Anyhow, he has received 10 days of treatment with appropriate antibiotics based on the sensitivities.  He has shown no signs of pneumonia.  I will be discontinuing his antibiotics at this point.  While at the facility he should be monitored for fevers.  The patient is also noted to have some moderate dysphagia and has been placed on pureed diet with nectar-thick liquids.  This should be also monitored at the facility and diet downgraded as needed.  PHYSICAL EXAMINATION ON DISCHARGE:  The patient is awake and alert. Neurologically speech is quite slow which is baseline for him, likely secondary to cognitive dysfunction from prior CVAs.  However, he is appropriate and able to answer questions and follow commands.  Left arm is contracted and he is unable to move this.  Right arm reveals 4-5 strength.  Right leg is also 4-5/5.  Left leg, he is currently unable to move it.  Lungs are clear bilaterally with a good respiratory effort.  Heart, regular rate and rhythm.  No murmurs.  Abdomen is soft, nontender, nondistended.  Bowel sounds positive.  Condition on discharge is stable.  FOLLOWUP INSTRUCTIONS:  The patient should have a neurologist appointed to him.  He will be traveling to  [place] to a rehab facility and a neurologist can be found for him there.  If his seizures are to continue, Dr. Hoy Morn has recommended that he will be evaluated in an epilepsy monitoring unit and have surgery if needed.  This would be either at [facility] or [facility].  FURTHER FOLLOWUP INSTRUCTIONS: 1. Monitor daily for fevers. 2. Monitor for aspiration and resulted aspiration pneumonia. 3. PT, OT as tolerated. 4. Diet should be a heart-healthy pureed with nectar-thick liquids.     The patient should be set upright and aspiration precautions should     be instituted.  Time on discharge was 60 minutes.     Katherinne Mofield,  M.D.     SR/MEDQ  D:  01/01/2011  T:  01/01/2011  Job:  [id number]  Electronically Signed by Calvert Cantor M.D. on 02/14/2011 12:07:53 PM

## 2019-03-31 ENCOUNTER — Inpatient Hospital Stay (HOSPITAL_COMMUNITY): Payer: Medicaid Other

## 2019-03-31 ENCOUNTER — Inpatient Hospital Stay (HOSPITAL_COMMUNITY)
Admission: AD | Admit: 2019-03-31 | Discharge: 2019-04-24 | DRG: 208 | Disposition: A | Discharge status: Skilled Nursing Facility | Payer: Medicaid Other | Source: Other Acute Inpatient Hospital | Attending: Family Medicine | Admitting: Family Medicine

## 2019-03-31 DIAGNOSIS — J9621 Acute and chronic respiratory failure with hypoxia: Principal | ICD-10-CM | POA: Diagnosis not present

## 2019-03-31 DIAGNOSIS — T426X5A Adverse effect of other antiepileptic and sedative-hypnotic drugs, initial encounter: Secondary | ICD-10-CM | POA: Diagnosis present

## 2019-03-31 DIAGNOSIS — J15212 Pneumonia due to Methicillin resistant Staphylococcus aureus: Secondary | ICD-10-CM | POA: Diagnosis not present

## 2019-03-31 DIAGNOSIS — F05 Delirium due to known physiological condition: Secondary | ICD-10-CM | POA: Diagnosis not present

## 2019-03-31 DIAGNOSIS — G40901 Epilepsy, unspecified, not intractable, with status epilepticus: Secondary | ICD-10-CM | POA: Diagnosis present

## 2019-03-31 DIAGNOSIS — H50111 Monocular exotropia, right eye: Secondary | ICD-10-CM | POA: Diagnosis present

## 2019-03-31 DIAGNOSIS — T508X5A Adverse effect of diagnostic agents, initial encounter: Secondary | ICD-10-CM | POA: Diagnosis present

## 2019-03-31 DIAGNOSIS — F0390 Unspecified dementia without behavioral disturbance: Secondary | ICD-10-CM | POA: Diagnosis present

## 2019-03-31 DIAGNOSIS — Z9289 Personal history of other medical treatment: Secondary | ICD-10-CM

## 2019-03-31 DIAGNOSIS — J95851 Ventilator associated pneumonia: Secondary | ICD-10-CM | POA: Diagnosis present

## 2019-03-31 DIAGNOSIS — Z9189 Other specified personal risk factors, not elsewhere classified: Secondary | ICD-10-CM | POA: Diagnosis not present

## 2019-03-31 DIAGNOSIS — I69391 Dysphagia following cerebral infarction: Secondary | ICD-10-CM

## 2019-03-31 DIAGNOSIS — D649 Anemia, unspecified: Secondary | ICD-10-CM | POA: Diagnosis present

## 2019-03-31 DIAGNOSIS — J9601 Acute respiratory failure with hypoxia: Secondary | ICD-10-CM

## 2019-03-31 DIAGNOSIS — I10 Essential (primary) hypertension: Secondary | ICD-10-CM | POA: Diagnosis present

## 2019-03-31 DIAGNOSIS — N179 Acute kidney failure, unspecified: Secondary | ICD-10-CM | POA: Diagnosis present

## 2019-03-31 DIAGNOSIS — J9691 Respiratory failure, unspecified with hypoxia: Secondary | ICD-10-CM

## 2019-03-31 DIAGNOSIS — I69328 Other speech and language deficits following cerebral infarction: Secondary | ICD-10-CM

## 2019-03-31 DIAGNOSIS — I69354 Hemiplegia and hemiparesis following cerebral infarction affecting left non-dominant side: Secondary | ICD-10-CM

## 2019-03-31 DIAGNOSIS — Z8673 Personal history of transient ischemic attack (TIA), and cerebral infarction without residual deficits: Secondary | ICD-10-CM | POA: Diagnosis not present

## 2019-03-31 DIAGNOSIS — I639 Cerebral infarction, unspecified: Secondary | ICD-10-CM | POA: Insufficient documentation

## 2019-03-31 DIAGNOSIS — R000 Tachycardia, unspecified: Secondary | ICD-10-CM | POA: Diagnosis not present

## 2019-03-31 DIAGNOSIS — H518 Other specified disorders of binocular movement: Secondary | ICD-10-CM | POA: Diagnosis present

## 2019-03-31 DIAGNOSIS — Y92230 Patient room in hospital as the place of occurrence of the external cause: Secondary | ICD-10-CM | POA: Diagnosis present

## 2019-03-31 DIAGNOSIS — G9340 Encephalopathy, unspecified: Secondary | ICD-10-CM | POA: Diagnosis not present

## 2019-03-31 DIAGNOSIS — Z0189 Encounter for other specified special examinations: Secondary | ICD-10-CM

## 2019-03-31 DIAGNOSIS — G92 Toxic encephalopathy: Secondary | ICD-10-CM | POA: Diagnosis present

## 2019-03-31 DIAGNOSIS — R1310 Dysphagia, unspecified: Secondary | ICD-10-CM

## 2019-03-31 DIAGNOSIS — N141 Nephropathy induced by other drugs, medicaments and biological substances: Secondary | ICD-10-CM | POA: Diagnosis present

## 2019-03-31 DIAGNOSIS — E870 Hyperosmolality and hypernatremia: Secondary | ICD-10-CM | POA: Diagnosis not present

## 2019-03-31 DIAGNOSIS — E119 Type 2 diabetes mellitus without complications: Secondary | ICD-10-CM | POA: Diagnosis present

## 2019-03-31 DIAGNOSIS — E46 Unspecified protein-calorie malnutrition: Secondary | ICD-10-CM | POA: Diagnosis present

## 2019-03-31 DIAGNOSIS — Z7982 Long term (current) use of aspirin: Secondary | ICD-10-CM

## 2019-03-31 DIAGNOSIS — J9811 Atelectasis: Secondary | ICD-10-CM | POA: Diagnosis not present

## 2019-03-31 DIAGNOSIS — J690 Pneumonitis due to inhalation of food and vomit: Secondary | ICD-10-CM | POA: Diagnosis present

## 2019-03-31 DIAGNOSIS — R569 Unspecified convulsions: Secondary | ICD-10-CM | POA: Insufficient documentation

## 2019-03-31 DIAGNOSIS — Z4659 Encounter for fitting and adjustment of other gastrointestinal appliance and device: Secondary | ICD-10-CM

## 2019-03-31 DIAGNOSIS — H5589 Other irregular eye movements: Secondary | ICD-10-CM | POA: Diagnosis present

## 2019-03-31 DIAGNOSIS — E873 Alkalosis: Secondary | ICD-10-CM | POA: Diagnosis not present

## 2019-03-31 DIAGNOSIS — Z7401 Bed confinement status: Secondary | ICD-10-CM

## 2019-03-31 DIAGNOSIS — E785 Hyperlipidemia, unspecified: Secondary | ICD-10-CM | POA: Diagnosis present

## 2019-03-31 DIAGNOSIS — Z20828 Contact with and (suspected) exposure to other viral communicable diseases: Secondary | ICD-10-CM | POA: Diagnosis present

## 2019-03-31 DIAGNOSIS — Z515 Encounter for palliative care: Secondary | ICD-10-CM

## 2019-03-31 DIAGNOSIS — Z931 Gastrostomy status: Secondary | ICD-10-CM

## 2019-03-31 DIAGNOSIS — J1520 Pneumonia due to staphylococcus, unspecified: Secondary | ICD-10-CM | POA: Diagnosis not present

## 2019-03-31 DIAGNOSIS — Z7189 Other specified counseling: Secondary | ICD-10-CM | POA: Diagnosis not present

## 2019-03-31 DIAGNOSIS — Z7984 Long term (current) use of oral hypoglycemic drugs: Secondary | ICD-10-CM

## 2019-03-31 DIAGNOSIS — F209 Schizophrenia, unspecified: Secondary | ICD-10-CM | POA: Diagnosis present

## 2019-03-31 DIAGNOSIS — E781 Pure hyperglyceridemia: Secondary | ICD-10-CM | POA: Diagnosis present

## 2019-03-31 DIAGNOSIS — J189 Pneumonia, unspecified organism: Secondary | ICD-10-CM

## 2019-03-31 DIAGNOSIS — T17908A Unspecified foreign body in respiratory tract, part unspecified causing other injury, initial encounter: Secondary | ICD-10-CM | POA: Diagnosis not present

## 2019-03-31 DIAGNOSIS — Z79899 Other long term (current) drug therapy: Secondary | ICD-10-CM

## 2019-03-31 HISTORY — DX: Schizophrenia, unspecified: F20.9

## 2019-03-31 HISTORY — DX: Cerebral infarction, unspecified: I63.9

## 2019-03-31 HISTORY — DX: Pneumonia, unspecified organism: J18.9

## 2019-03-31 HISTORY — DX: Essential (primary) hypertension: I10

## 2019-03-31 HISTORY — DX: Type 2 diabetes mellitus without complications: E11.9

## 2019-03-31 HISTORY — DX: Unspecified dementia, unspecified severity, without behavioral disturbance, psychotic disturbance, mood disturbance, and anxiety: F03.90

## 2019-03-31 LAB — POCT I-STAT 7, (LYTES, BLD GAS, ICA,H+H)
Acid-base deficit: 1 mmol/L (ref 0.0–2.0)
Bicarbonate: 22.1 mmol/L (ref 20.0–28.0)
Calcium, Ion: 1.14 mmol/L — ABNORMAL LOW (ref 1.15–1.40)
HCT: 29 % — ABNORMAL LOW (ref 39.0–52.0)
Hemoglobin: 9.9 g/dL — ABNORMAL LOW (ref 13.0–17.0)
O2 Saturation: 96 %
Patient temperature: 98.6
Potassium: 3.5 mmol/L (ref 3.5–5.1)
Sodium: 139 mmol/L (ref 135–145)
TCO2: 23 mmol/L (ref 22–32)
pCO2 arterial: 31.5 mmHg — ABNORMAL LOW (ref 32.0–48.0)
pH, Arterial: 7.454 — ABNORMAL HIGH (ref 7.350–7.450)
pO2, Arterial: 80 mmHg — ABNORMAL LOW (ref 83.0–108.0)

## 2019-03-31 LAB — CBC
HCT: 27.5 % — ABNORMAL LOW (ref 39.0–52.0)
Hemoglobin: 9.4 g/dL — ABNORMAL LOW (ref 13.0–17.0)
MCH: 27.3 pg (ref 26.0–34.0)
MCHC: 34.2 g/dL (ref 30.0–36.0)
MCV: 79.9 fL — ABNORMAL LOW (ref 80.0–100.0)
Platelets: 312 10*3/uL (ref 150–400)
RBC: 3.44 MIL/uL — ABNORMAL LOW (ref 4.22–5.81)
RDW: 15 % (ref 11.5–15.5)
WBC: 10.9 10*3/uL — ABNORMAL HIGH (ref 4.0–10.5)
nRBC: 0 % (ref 0.0–0.2)

## 2019-03-31 LAB — MRSA PCR SCREENING: MRSA by PCR: POSITIVE — AB

## 2019-03-31 MED ORDER — CHLORHEXIDINE GLUCONATE 0.12% ORAL RINSE (MEDLINE KIT)
15.0000 mL | Freq: Two times a day (BID) | OROMUCOSAL | Status: DC
  Administered 2019-03-31 – 2019-04-22 (×45): 15 mL via OROMUCOSAL

## 2019-03-31 MED ORDER — MIDAZOLAM HCL 2 MG/2ML IJ SOLN: 2.0000 mg | INTRAMUSCULAR | Status: DC | PRN

## 2019-03-31 MED ORDER — FENTANYL CITRATE (PF) 100 MCG/2ML IJ SOLN
50.0000 ug | INTRAMUSCULAR | Status: AC | PRN
  Administered 2019-03-31 – 2019-04-12 (×3): 50 ug via INTRAVENOUS
  Filled 2019-03-31 (×3): qty 2

## 2019-03-31 MED ORDER — MIDAZOLAM HCL 2 MG/2ML IJ SOLN
2.0000 mg | INTRAMUSCULAR | Status: DC | PRN
  Filled 2019-03-31: qty 2

## 2019-03-31 MED ORDER — SODIUM CHLORIDE 0.9 % IV SOLN
100.0000 mg | Freq: Two times a day (BID) | INTRAVENOUS | Status: DC
  Administered 2019-03-31 – 2019-04-09 (×18): 100 mg via INTRAVENOUS
  Filled 2019-03-31 (×20): qty 10

## 2019-03-31 MED ORDER — PANTOPRAZOLE SODIUM 40 MG IV SOLR
40.0000 mg | Freq: Every day | INTRAVENOUS | Status: DC
  Administered 2019-04-01: 01:00:00 40 mg via INTRAVENOUS
  Filled 2019-03-31: qty 40

## 2019-03-31 MED ORDER — CHLORHEXIDINE GLUCONATE CLOTH 2 % EX PADS
6.0000 | MEDICATED_PAD | Freq: Every day | CUTANEOUS | Status: DC
  Administered 2019-03-31: 22:00:00 6 via TOPICAL

## 2019-03-31 MED ORDER — LORAZEPAM 2 MG/ML IJ SOLN
1.0000 mg | INTRAMUSCULAR | Status: DC | PRN
  Administered 2019-04-03 – 2019-04-09 (×3): 2 mg via INTRAVENOUS
  Administered 2019-04-09: 1 mg via INTRAVENOUS
  Administered 2019-04-10 – 2019-04-12 (×3): 2 mg via INTRAVENOUS
  Administered 2019-04-14 – 2019-04-17 (×2): 1 mg via INTRAVENOUS
  Administered 2019-04-20 – 2019-04-23 (×5): 2 mg via INTRAVENOUS
  Filled 2019-03-31 (×15): qty 1

## 2019-03-31 MED ORDER — INSULIN ASPART 100 UNIT/ML ~~LOC~~ SOLN
0.0000 [IU] | SUBCUTANEOUS | Status: DC
  Administered 2019-04-02 – 2019-04-03 (×5): 1 [IU] via SUBCUTANEOUS
  Administered 2019-04-03: 09:00:00 2 [IU] via SUBCUTANEOUS
  Administered 2019-04-03 – 2019-04-04 (×2): 1 [IU] via SUBCUTANEOUS
  Administered 2019-04-04: 04:00:00 2 [IU] via SUBCUTANEOUS
  Administered 2019-04-04: 12:00:00 1 [IU] via SUBCUTANEOUS
  Administered 2019-04-04 – 2019-04-05 (×3): 2 [IU] via SUBCUTANEOUS
  Administered 2019-04-05: 09:00:00 1 [IU] via SUBCUTANEOUS
  Administered 2019-04-05: 17:00:00 2 [IU] via SUBCUTANEOUS
  Administered 2019-04-05: 13:00:00 3 [IU] via SUBCUTANEOUS
  Administered 2019-04-05 – 2019-04-06 (×3): 2 [IU] via SUBCUTANEOUS
  Administered 2019-04-06 (×2): 1 [IU] via SUBCUTANEOUS
  Administered 2019-04-06 – 2019-04-07 (×5): 2 [IU] via SUBCUTANEOUS
  Administered 2019-04-07 (×3): 1 [IU] via SUBCUTANEOUS
  Administered 2019-04-08 (×3): 2 [IU] via SUBCUTANEOUS
  Administered 2019-04-08 – 2019-04-09 (×2): 1 [IU] via SUBCUTANEOUS
  Administered 2019-04-09 – 2019-04-10 (×2): 2 [IU] via SUBCUTANEOUS
  Administered 2019-04-10 (×2): 1 [IU] via SUBCUTANEOUS
  Administered 2019-04-10: 2 [IU] via SUBCUTANEOUS
  Administered 2019-04-10: 1 [IU] via SUBCUTANEOUS
  Administered 2019-04-11: 13:00:00 2 [IU] via SUBCUTANEOUS
  Administered 2019-04-11: 4 [IU] via SUBCUTANEOUS
  Administered 2019-04-11 (×4): 1 [IU] via SUBCUTANEOUS
  Administered 2019-04-12 (×2): 3 [IU] via SUBCUTANEOUS
  Administered 2019-04-12 (×2): 2 [IU] via SUBCUTANEOUS
  Administered 2019-04-12: 5 [IU] via SUBCUTANEOUS
  Administered 2019-04-12 – 2019-04-13 (×2): 2 [IU] via SUBCUTANEOUS
  Administered 2019-04-13: 17:00:00 1 [IU] via SUBCUTANEOUS
  Administered 2019-04-13 (×2): 2 [IU] via SUBCUTANEOUS
  Administered 2019-04-13: 1 [IU] via SUBCUTANEOUS
  Administered 2019-04-13: 2 [IU] via SUBCUTANEOUS
  Administered 2019-04-14: 3 [IU] via SUBCUTANEOUS
  Administered 2019-04-14 – 2019-04-15 (×6): 2 [IU] via SUBCUTANEOUS
  Administered 2019-04-15 (×2): 1 [IU] via SUBCUTANEOUS
  Administered 2019-04-16: 2 [IU] via SUBCUTANEOUS
  Administered 2019-04-16 (×4): 1 [IU] via SUBCUTANEOUS
  Administered 2019-04-16 – 2019-04-17 (×2): 2 [IU] via SUBCUTANEOUS
  Administered 2019-04-17: 13:00:00 1 [IU] via SUBCUTANEOUS
  Administered 2019-04-17 (×2): 2 [IU] via SUBCUTANEOUS
  Administered 2019-04-17 – 2019-04-19 (×7): 1 [IU] via SUBCUTANEOUS
  Administered 2019-04-20 (×2): 2 [IU] via SUBCUTANEOUS
  Administered 2019-04-20: 14:00:00 1 [IU] via SUBCUTANEOUS
  Administered 2019-04-21 (×3): 2 [IU] via SUBCUTANEOUS
  Administered 2019-04-22 – 2019-04-23 (×6): 1 [IU] via SUBCUTANEOUS
  Administered 2019-04-23: 2 [IU] via SUBCUTANEOUS
  Administered 2019-04-23 (×2): 1 [IU] via SUBCUTANEOUS
  Administered 2019-04-24 (×2): 2 [IU] via SUBCUTANEOUS

## 2019-03-31 MED ORDER — SODIUM CHLORIDE 0.9 % IV SOLN
INTRAVENOUS | Status: DC
  Administered 2019-03-31: via INTRAVENOUS

## 2019-03-31 MED ORDER — PIPERACILLIN-TAZOBACTAM 3.375 G IVPB 30 MIN
3.3750 g | Freq: Once | INTRAVENOUS | Status: AC
  Administered 2019-04-01: 3.375 g via INTRAVENOUS
  Filled 2019-03-31: qty 50

## 2019-03-31 MED ORDER — ORAL CARE MOUTH RINSE
15.0000 mL | OROMUCOSAL | Status: DC
  Administered 2019-03-31 – 2019-04-20 (×172): 15 mL via OROMUCOSAL

## 2019-03-31 MED ORDER — FENTANYL CITRATE (PF) 100 MCG/2ML IJ SOLN
50.0000 ug | INTRAMUSCULAR | Status: DC | PRN
  Administered 2019-04-01: 50 ug via INTRAVENOUS
  Administered 2019-04-02 – 2019-04-03 (×7): 100 ug via INTRAVENOUS
  Administered 2019-04-03: 11:00:00 50 ug via INTRAVENOUS
  Administered 2019-04-04: 100 ug via INTRAVENOUS
  Filled 2019-03-31 (×10): qty 2

## 2019-03-31 MED ORDER — LEVETIRACETAM IN NACL 500 MG/100ML IV SOLN
500.0000 mg | Freq: Two times a day (BID) | INTRAVENOUS | Status: DC
  Administered 2019-04-01 – 2019-04-09 (×18): 500 mg via INTRAVENOUS
  Filled 2019-03-31 (×18): qty 100

## 2019-03-31 MED ORDER — HEPARIN SODIUM (PORCINE) 5000 UNIT/ML IJ SOLN
5000.0000 [IU] | Freq: Three times a day (TID) | INTRAMUSCULAR | Status: DC
  Administered 2019-04-01 – 2019-04-18 (×51): 5000 [IU] via SUBCUTANEOUS
  Filled 2019-03-31 (×49): qty 1

## 2019-03-31 NOTE — H&P (Signed)
NAME:  Chad Mcclure, MRN:  [id number], DOB:  April 04, 1956, LOS: 0 ADMISSION DATE:  03/31/2019, CONSULTATION DATE:  03/31/19 REFERRING MD:  [facility]  CHIEF COMPLAINT:  Status epilepticus   Brief History   Chad Mcclure is a 63 y.o. male who was initially admitted to [name] 8/20 with fevers felt to be due to PNA and cystitis. Required intubation 8/21 for hypoxic respiratory failure.  Extubated 8/29 then required re-intubation 8/31 for status epilepticus. Transferred to [facility] 9/2.  History of present illness   Pt is encephelopathic; therefore, this HPI is obtained from chart review. Chad Mcclure is a 63 y.o. male who has a PMH including but not limited to HTN, HLD, HCV, Left CVA, DM, schizophrenia, dementia and who resides at [facility] in [place] (see "past medical history" for rest).  He was admitted to [facility] on 03/18/19 with fever.  He was started on vanc / zosyn for suspected PNA and cystitis.  See below for a list of events from the hospitalization: 8/21, rapid response was activated for hypoxic respiratory failure and pt was transferred to the ICU where he required intubation.   Was on propofol initially which was later switched to fentanyl due to hypertriglyceridemia. 8/24, he required initiation of hemodialysis after failing a lasix challenge, TTS schedule planned (R subclavian perm cath placed). 8/26, he had a witnessed aspiration event, zosyn continued.   8/29 he passed SBT and was extubated. 8/31 had seizures and status epilepticus requiring re-intubation.  Treated with ativan, vimpat, keppra. 9/1 EEG negative for seizures. 9/2 propofol discontinued but pt remained encephalopathic / unresponsive.  Later transferred to [facility] for further evaluation and management.    Past Medical History  HTN, HLD, HCV, Left CVA, DM, schizophrenia, dementia.  Significant Hospital Events   8/20 > admit. 8/21 > intubation. 8/24 > HD started. 8/29 > extubated. 8/31 > seizures /  status. 9/2 > transferred to [facility].  Consults:  Neurology. Nephrology.  Procedures:  ETT 8/21 > 8/29.  8/31 >  R [place] HD 8/24 >  L IJ CVL 8/22 >   Significant Diagnostic Tests:  EEG 9/1 > neg. CT head 9/2 >   Micro Data:  SARS CoV2 8/20 > neg.  Antimicrobials:  Zosyn 9/2 >   Sputum (from [facility]) >   Interim history/subjective:  Unresponsive.  Right sided gaze.  Objective:  Height 6' (1.829 m), SpO2 97 %.    Vent Mode: PRVC FiO2 (%):  [40 %] 40 % Set Rate:  [22 bmp] 22 bmp Vt Set:  [875 mL] 620 mL PEEP:  [8 cmH20] 8 cmH20 Plateau Pressure:  [21 cmH20] 21 cmH20  No intake or output data in the 24 hours ending 03/31/19 2222 There were no vitals filed for this visit.  Examination: General: Adult male, in NAD. Neuro: Unresponsive.  Withdraws RUE and RLE to pain.  Right sided gaze. HEENT: [place]/AT. Sclerae anicteric.  ETT in place. Cardiovascular: RRR, no M/R/G.  Lungs: Respirations even and unlabored.  CTA bilaterally, No W/R/R. Abdomen: BS x 4, soft, NT/ND.  Musculoskeletal: No gross deformities, no edema.  Skin: Intact, warm, no rashes.  Assessment & Plan:   Acute hypoxic respiratory failure. - Continue full vent support. - Daily SBT if mental status allows. - Bronchial hygiene. - Follow CXR.  Aspiration PNA. - Continue zosyn. - Pharmacy to please follow cultures from [facility].  Seizures with concern for status. - Continue vimpat, keppra, ativan PRN. - Neurology consulted.  AKI - started on HD  on 8/24 (TTS schedule). - Day team to please consult nephrology. - Follow labs.  Protein calorie malnutrition. - Tube feeds per nutrition.  Hx DM. - SSI.  Best Practice:  Diet: Tube feeds. Pain/Anxiety/Delirium protocol (if indicated): Fentanyl PRN / Midazolam PRN.  RASS goal 0. VAP protocol (if indicated): In place. DVT prophylaxis: SCD's / Heparin. GI prophylaxis: PPI. Glucose control: SSI. Mobility: Bedrest. Code Status: Full. Family Communication:  None available. Disposition: ICU.  Labs   CBC: No results for input(s): WBC, NEUTROABS, HGB, HCT, MCV, PLT in the last 168 hours. Basic Metabolic Panel: No results for input(s): NA, K, CL, CO2, GLUCOSE, BUN, CREATININE, CALCIUM, MG, PHOS in the last 168 hours. GFR: CrCl cannot be calculated (Patient's most recent lab result is older than the maximum 21 days allowed.). No results for input(s): PROCALCITON, WBC, LATICACIDVEN in the last 168 hours. Liver Function Tests: No results for input(s): AST, ALT, ALKPHOS, BILITOT, PROT, ALBUMIN in the last 168 hours. No results for input(s): LIPASE, AMYLASE in the last 168 hours. No results for input(s): AMMONIA in the last 168 hours. ABG    Component Value Date/Time   PHART 7.489 (H) 12/28/2010 0605   PCO2ART 35.1 12/28/2010 0605   PO2ART 82.0 12/28/2010 0605   HCO3 26.7 (H) 12/28/2010 0605   TCO2 28 12/28/2010 0605   ACIDBASEDEF 0.0 05/18/2009 1745   O2SAT 97.0 12/28/2010 0605    Coagulation Profile: No results for input(s): INR, PROTIME in the last 168 hours. Cardiac Enzymes: No results for input(s): CKTOTAL, CKMB, CKMBINDEX, TROPONINI in the last 168 hours. HbA1C: Hgb A1c MFr Bld  Date/Time Value Ref Range Status  12/16/2010 04:00 AM  <5.7 % Final   5.6 (NOTE)                                                                       According to the ADA Clinical Practice Recommendations for 2011, when HbA1c is used as a screening test:   >=6.5%   Diagnostic of Diabetes Mellitus           (if abnormal result  is confirmed)  5.7-6.4%   Increased risk of developing Diabetes Mellitus  References:Diagnosis and Classification of Diabetes Mellitus,Diabetes Care,2011,34(Suppl 1):S62-S69 and Standards of Medical Care in         Diabetes - 2011,Diabetes Care,2011,34  (Suppl 1):S11-S61.  07/06/2009 02:25 AM  4.6 - 6.1 % Final   5.7 (NOTE) The ADA recommends the following therapeutic goal for glycemic control related to Hgb A1c measurement: Goal of  therapy: <6.5 Hgb A1c  Reference: American Diabetes Association: Clinical Practice Recommendations 2010, Diabetes Care, 2010, 33: (Suppl  1).   CBG: No results for input(s): GLUCAP in the last 168 hours.  Review of Systems:   Unable to obtain as pt is encephalopathic.  Past medical history  He,  has no past medical history on file.   Surgical History   Not available.  Social History      Family history   His family history is not on file.   Allergies Not on File   Home meds  Prior to Admission medications   Not on File    Critical care time: 45 min.    Chad Guysahul Agness Sibrian, PA - C  [facility] Pager: [phone] - [phone].  If no answer, [phone] 03/31/2019, 10:22 PM

## 2019-04-01 ENCOUNTER — Inpatient Hospital Stay (HOSPITAL_COMMUNITY): Payer: Medicaid Other

## 2019-04-01 DIAGNOSIS — G40901 Epilepsy, unspecified, not intractable, with status epilepticus: Secondary | ICD-10-CM

## 2019-04-01 DIAGNOSIS — J9601 Acute respiratory failure with hypoxia: Secondary | ICD-10-CM

## 2019-04-01 DIAGNOSIS — N179 Acute kidney failure, unspecified: Secondary | ICD-10-CM

## 2019-04-01 DIAGNOSIS — J690 Pneumonitis due to inhalation of food and vomit: Secondary | ICD-10-CM | POA: Diagnosis present

## 2019-04-01 DIAGNOSIS — G9340 Encephalopathy, unspecified: Secondary | ICD-10-CM

## 2019-04-01 LAB — POCT I-STAT 7, (LYTES, BLD GAS, ICA,H+H)
Acid-base deficit: 2 mmol/L (ref 0.0–2.0)
Acid-base deficit: 3 mmol/L — ABNORMAL HIGH (ref 0.0–2.0)
Bicarbonate: 21.5 mmol/L (ref 20.0–28.0)
Bicarbonate: 20.3 mmol/L (ref 20.0–28.0)
Calcium, Ion: 1.16 mmol/L (ref 1.15–1.40)
Calcium, Ion: 1.15 mmol/L (ref 1.15–1.40)
HCT: 33 % — ABNORMAL LOW (ref 39.0–52.0)
HCT: 25 % — ABNORMAL LOW (ref 39.0–52.0)
Hemoglobin: 11.2 g/dL — ABNORMAL LOW (ref 13.0–17.0)
Hemoglobin: 8.5 g/dL — ABNORMAL LOW (ref 13.0–17.0)
O2 Saturation: 94 %
O2 Saturation: 96 %
Patient temperature: 99.7
Patient temperature: 98.6
Potassium: 3.8 mmol/L (ref 3.5–5.1)
Potassium: 3.5 mmol/L (ref 3.5–5.1)
Sodium: 141 mmol/L (ref 135–145)
Sodium: 146 mmol/L — ABNORMAL HIGH (ref 135–145)
TCO2: 22 mmol/L (ref 22–32)
TCO2: 21 mmol/L — ABNORMAL LOW (ref 22–32)
pCO2 arterial: 31.9 mmHg — ABNORMAL LOW (ref 32.0–48.0)
pCO2 arterial: 28.6 mmHg — ABNORMAL LOW (ref 32.0–48.0)
pH, Arterial: 7.44 (ref 7.350–7.450)
pH, Arterial: 7.459 — ABNORMAL HIGH (ref 7.350–7.450)
pO2, Arterial: 70 mmHg — ABNORMAL LOW (ref 83.0–108.0)
pO2, Arterial: 75 mmHg — ABNORMAL LOW (ref 83.0–108.0)

## 2019-04-01 LAB — GLUCOSE, CAPILLARY
Glucose-Capillary: 105 mg/dL — ABNORMAL HIGH (ref 70–99)
Glucose-Capillary: 71 mg/dL (ref 70–99)
Glucose-Capillary: 76 mg/dL (ref 70–99)
Glucose-Capillary: 85 mg/dL (ref 70–99)
Glucose-Capillary: 87 mg/dL (ref 70–99)
Glucose-Capillary: 89 mg/dL (ref 70–99)

## 2019-04-01 LAB — CBC
HCT: 28.1 % — ABNORMAL LOW (ref 39.0–52.0)
Hemoglobin: 9.6 g/dL — ABNORMAL LOW (ref 13.0–17.0)
MCH: 27.3 pg (ref 26.0–34.0)
MCHC: 34.2 g/dL (ref 30.0–36.0)
MCV: 79.8 fL — ABNORMAL LOW (ref 80.0–100.0)
Platelets: 315 10*3/uL (ref 150–400)
RBC: 3.52 MIL/uL — ABNORMAL LOW (ref 4.22–5.81)
RDW: 15.1 % (ref 11.5–15.5)
WBC: 10.9 10*3/uL — ABNORMAL HIGH (ref 4.0–10.5)
nRBC: 0 % (ref 0.0–0.2)

## 2019-04-01 LAB — URINALYSIS, ROUTINE W REFLEX MICROSCOPIC
Bilirubin Urine: NEGATIVE
Glucose, UA: NEGATIVE mg/dL
Ketones, ur: NEGATIVE mg/dL
Leukocytes,Ua: NEGATIVE
Nitrite: NEGATIVE
Protein, ur: 100 mg/dL — AB
Specific Gravity, Urine: 1.006 (ref 1.005–1.030)
pH: 6 (ref 5.0–8.0)

## 2019-04-01 LAB — BASIC METABOLIC PANEL
Anion gap: 15 (ref 5–15)
BUN: 49 mg/dL — ABNORMAL HIGH (ref 8–23)
CO2: 21 mmol/L — ABNORMAL LOW (ref 22–32)
Calcium: 8.2 mg/dL — ABNORMAL LOW (ref 8.9–10.3)
Chloride: 102 mmol/L (ref 98–111)
Creatinine, Ser: 6.02 mg/dL — ABNORMAL HIGH (ref 0.61–1.24)
GFR calc Af Amer: 11 mL/min — ABNORMAL LOW (ref >60)
GFR calc non Af Amer: 9 mL/min — ABNORMAL LOW (ref >60)
Glucose, Bld: 91 mg/dL (ref 70–99)
Potassium: 3.9 mmol/L (ref 3.5–5.1)
Sodium: 138 mmol/L (ref 135–145)

## 2019-04-01 LAB — COMPREHENSIVE METABOLIC PANEL
ALT: 81 U/L — ABNORMAL HIGH (ref 0–44)
AST: 63 U/L — ABNORMAL HIGH (ref 15–41)
Albumin: 1.9 g/dL — ABNORMAL LOW (ref 3.5–5.0)
Alkaline Phosphatase: 52 U/L (ref 38–126)
Anion gap: 14 (ref 5–15)
BUN: 47 mg/dL — ABNORMAL HIGH (ref 8–23)
CO2: 22 mmol/L (ref 22–32)
Calcium: 8.4 mg/dL — ABNORMAL LOW (ref 8.9–10.3)
Chloride: 103 mmol/L (ref 98–111)
Creatinine, Ser: 5.96 mg/dL — ABNORMAL HIGH (ref 0.61–1.24)
GFR calc Af Amer: 11 mL/min — ABNORMAL LOW (ref >60)
GFR calc non Af Amer: 9 mL/min — ABNORMAL LOW (ref >60)
Glucose, Bld: 91 mg/dL (ref 70–99)
Potassium: 3.7 mmol/L (ref 3.5–5.1)
Sodium: 139 mmol/L (ref 135–145)
Total Bilirubin: 0.8 mg/dL (ref 0.3–1.2)
Total Protein: 6.7 g/dL (ref 6.5–8.1)

## 2019-04-01 LAB — PHOSPHORUS
Phosphorus: 4.7 mg/dL — ABNORMAL HIGH (ref 2.5–4.6)
Phosphorus: 4.4 mg/dL (ref 2.5–4.6)

## 2019-04-01 LAB — HEMOGLOBIN A1C
Hgb A1c MFr Bld: 7 % — ABNORMAL HIGH (ref 4.8–5.6)
Mean Plasma Glucose: 154.2 mg/dL

## 2019-04-01 LAB — MAGNESIUM
Magnesium: 2.2 mg/dL (ref 1.7–2.4)
Magnesium: 2.1 mg/dL (ref 1.7–2.4)

## 2019-04-01 LAB — HIV ANTIBODY (ROUTINE TESTING W REFLEX): HIV Screen 4th Generation wRfx: NONREACTIVE

## 2019-04-01 MED ORDER — SODIUM CHLORIDE 0.9 % IV SOLN
500.0000 mg | INTRAVENOUS | Status: DC
  Administered 2019-04-01 – 2019-04-02 (×2): 500 mg via INTRAVENOUS
  Filled 2019-04-01 (×3): qty 0.5

## 2019-04-01 MED ORDER — ALTEPLASE 2 MG IJ SOLR: 2.0000 mg | Freq: Once | INTRAMUSCULAR | Status: DC

## 2019-04-01 MED ORDER — CHLORHEXIDINE GLUCONATE CLOTH 2 % EX PADS: 6.0000 | MEDICATED_PAD | Freq: Every day | CUTANEOUS | Status: DC

## 2019-04-01 MED ORDER — VANCOMYCIN HCL IN DEXTROSE 1-5 GM/200ML-% IV SOLN: 1000.0000 mg | INTRAVENOUS | Status: DC

## 2019-04-01 MED ORDER — VITAL HIGH PROTEIN PO LIQD: 1000.0000 mL | ORAL | Status: DC

## 2019-04-01 MED ORDER — PRO-STAT SUGAR FREE PO LIQD: 30.0000 mL | Freq: Two times a day (BID) | ORAL | Status: DC

## 2019-04-01 MED ORDER — LABETALOL HCL 5 MG/ML IV SOLN
10.0000 mg | INTRAVENOUS | Status: AC | PRN
  Administered 2019-04-01 – 2019-04-04 (×10): 10 mg via INTRAVENOUS
  Filled 2019-04-01 (×8): qty 4

## 2019-04-01 MED ORDER — SODIUM CHLORIDE 0.9 % IV SOLN
INTRAVENOUS | Status: DC | PRN
  Administered 2019-04-01: 01:00:00 250 mL via INTRAVENOUS

## 2019-04-01 MED ORDER — MUPIROCIN 2 % EX OINT
1.0000 "application " | TOPICAL_OINTMENT | Freq: Two times a day (BID) | CUTANEOUS | Status: AC
  Administered 2019-04-01 – 2019-04-05 (×10): 1 via NASAL
  Filled 2019-04-01 (×2): qty 22

## 2019-04-01 MED ORDER — PANTOPRAZOLE SODIUM 40 MG PO PACK
40.0000 mg | PACK | Freq: Every day | ORAL | Status: DC
  Administered 2019-04-01 – 2019-04-24 (×22): 40 mg
  Filled 2019-04-01 (×24): qty 20

## 2019-04-01 MED ORDER — CHLORHEXIDINE GLUCONATE CLOTH 2 % EX PADS
6.0000 | MEDICATED_PAD | Freq: Every day | CUTANEOUS | Status: DC
  Administered 2019-04-02: 6 via TOPICAL

## 2019-04-01 MED ORDER — SODIUM CHLORIDE 0.9 % IV SOLN
INTRAVENOUS | Status: DC | PRN
  Administered 2019-04-01 – 2019-04-02 (×2): 1000 mL via INTRAVENOUS
  Administered 2019-04-04: 11:00:00 250 mL via INTRAVENOUS

## 2019-04-01 MED ORDER — SODIUM CHLORIDE 0.9% FLUSH: 10.0000 mL | INTRAVENOUS | Status: DC | PRN

## 2019-04-01 MED ORDER — VITAL 1.5 CAL PO LIQD
1000.0000 mL | ORAL | Status: DC
  Administered 2019-04-01 – 2019-04-12 (×8): 1000 mL
  Filled 2019-04-01 (×15): qty 1000

## 2019-04-01 MED ORDER — PRO-STAT SUGAR FREE PO LIQD
30.0000 mL | Freq: Three times a day (TID) | ORAL | Status: DC
  Administered 2019-04-01 – 2019-04-13 (×32): 30 mL
  Filled 2019-04-01 (×31): qty 30

## 2019-04-01 MED ORDER — CHLORHEXIDINE GLUCONATE CLOTH 2 % EX PADS
6.0000 | MEDICATED_PAD | Freq: Every day | CUTANEOUS | Status: DC
  Administered 2019-04-03 – 2019-04-09 (×8): 6 via TOPICAL

## 2019-04-01 MED ORDER — VANCOMYCIN HCL 10 G IV SOLR
2000.0000 mg | Freq: Once | INTRAVENOUS | Status: AC
  Administered 2019-04-01: 14:00:00 2000 mg via INTRAVENOUS
  Filled 2019-04-01: qty 2000

## 2019-04-01 NOTE — Progress Notes (Signed)
RN talked to RN from [facility] ICU patient was transferred from. Per [facility] ICU RN, pt has been having different pupil sizes and fluctuations since he was admitted. Also, patient's last Dialysis was on Tuesday 03/30/2019 there and had 3 liters removed. Pt. Received dialysis there Tuesday, Thursday, Saturday. RN informed CCMD. Nephrology consulted. RN will continue to monitor.

## 2019-04-01 NOTE — Procedures (Signed)
Patient Name: CAELUM FEDERICI  MRN: [id number]  Epilepsy Attending: Lora Havens  Referring Physician/Provider: Dr Karena Addison Aroor Duration: 04/01/2019 0413 to 04/01/2019 1947  Patient history: 63 year old male with altered mental status concerning. EEG to evaluate for subclinical seizure/status epilepticus.   Level of alertness: Obtunded  AEDs during EEG study: Versed, Keppra, lacosamide  Technical aspects: This EEG study was done with scalp electrodes positioned according to the 10-20 International system of electrode placement. Electrical activity was acquired at a sampling rate of 500Hz  and reviewed with a high frequency filter of 70Hz  and a low frequency filter of 1Hz . EEG data were recorded continuously and digitally stored.   Description: EEG showed continuous generalized 3 to 6 Hz theta delta slowing which was reactive to tactile stimuli. Hyperventilation and photic stimulation were not performed.  IMPRESSION: This study is suggestive of severe diffuse encephalopathy. No seizures or epileptiform discharges were seen throughout the recording.  Oliana Gowens Barbra Sarks

## 2019-04-01 NOTE — Consult Note (Signed)
Requesting Physician: Dr. Craige Cotta    Chief Complaint/Reason for Consult: Status Epilepticus, "not waking up"   History obtained from: Chart review  HPI:                                                                                                                                       Chad Mcclure is a 63 y.o. male with past medical history significant for hypertension, hyperlipidemia, hepatitis C, left hemispheric stroke, diabetes mellitus, schizophrenia and dementia who lives in a SNF at then well transferred from [facility] for management of status epilepticus.  History is obtained by chart review.  Patient was admitted to [facility] on 03/18/2019 with fever and started on antibiotics for suspected pneumonia and cystitis.  On 8/21 he had acute hypoxic respiratory failure and was intubated.  He was started on hemodialysis on 8/24 for acute kidney injury.  He was extubated on 8/29 and noted to have seizures/status epilepticus requiring reintubation and treated with Vimpat and Keppra.  On 9/1 EEG was obtained negative for seizures.  On 9/2 propofol was discontinued however patient remained encephalopathic and transferred to [facility] for further evaluation and management.  On arrival to [facility]:, Patient off propofol.  He was noted to have disconjugate gaze with the right eye gaze deviation to the right.  Withdraws on the right side but not on the left.  Neurology was consulted for concern for subclinical status epilepticus.  A stat head CT was obtained which is negative for acute findings.  Past medical history as noted above  No family history on file.  Unable to obtain Social History:  has no history on file for tobacco, alcohol, and drug.  Allergies: Not on File  Medications:                                                                                                                        I reviewed home medications   ROS:  Unable to obtain due to patient being intubated and comatose   Examination:                                                                                                      General: Appears well-developed and well-nourished.  Psych: Affect appropriate to situation Eyes: No scleral injection HENT: No OP obstrucion Head: Normocephalic.  Cardiovascular: Normal rate and regular rhythm.  Respiratory: Effort normal and breath sounds normal to anterior ascultation GI: Soft.  No distension. There is no tenderness.  Skin: WDI    Neurological Examination Mental Status: Patient does not respond to verbal stimuli.  Does not respond to deep sternal rub.  Does not follow commands.  No verbalizations noted.  Cranial Nerves: II: pupils are reactive but unequal 8 right mm, left  6 mm III,IV,VI: disconjugate gaze with right eye deviated to right side.  V,VII: corneal reflex: present VIII: patient does not respond to verbal stimuli IX,X: gag reflex present XI: trapezius strength unable to test bilaterally XII: tongue strength unable to test Motor: Withdraws on the right arm and right leg. Does not withdraw on the left side.  Sensory: unable to obtain due to mental status Deep Tendon Reflexes: 3+ patella, biceps on the left, 2+ on the right patella and biceps Plantars: mute bilaterally Cerebellar: Unable to perform     Lab Results: Basic Metabolic Panel: Recent Labs  Lab 03/31/19 2253 03/31/19 2317  NA 139 139  K 3.5 3.7  CL  --  103  CO2  --  22  GLUCOSE  --  91  BUN  --  47*  CREATININE  --  5.96*  CALCIUM  --  8.4*    CBC: Recent Labs  Lab 03/31/19 2253 03/31/19 2317  WBC  --  10.9*  HGB 9.9* 9.4*  HCT 29.0* 27.5*  MCV  --  79.9*  PLT  --  312    Coagulation Studies: No results for input(s): LABPROT, INR in the last 72 hours.  Imaging: Ct Head Wo Contrast  Result Date:  03/31/2019 CLINICAL DATA:  Initial evaluation for acute altered mental status. EXAM: CT HEAD WITHOUT CONTRAST TECHNIQUE: Contiguous axial images were obtained from the base of the skull through the vertex without intravenous contrast. COMPARISON:  Prior CT from 12/16/2010. FINDINGS: Brain: Moderate to advanced cerebral atrophy with chronic microvascular ischemic disease. No acute intracranial hemorrhage. No acute large vessel territory infarct. No mass lesion, midline shift or mass effect. Diffuse ventricular prominence related to global parenchymal volume loss of hydrocephalus. No extra-axial fluid collection. Vascular: No hyperdense vessel. Scattered vascular calcifications noted within the carotid siphons. Skull: Scalp soft tissues and calvarium within normal limits. Sinuses/Orbits: Globes orbital soft tissues within normal limits. Scattered mucosal thickening noted throughout the paranasal sinuses. Superimposed air-fluid levels noted within the sphenoid sinuses and right maxillary sinus. Fluid seen layering within the nasopharynx. Trace bilateral mastoid effusions. Patient is likely intubated. Other: None. IMPRESSION: 1. No acute intracranial abnormality. 2. Moderately advanced cerebral atrophy with chronic microvascular ischemic disease. Electronically Signed   By: Janell QuietBenjamin  McClintock M.D.  On: 03/31/2019 23:57     I have reviewed the above imaging : CT head    ASSESSMENT AND PLAN   63 y.o. male with past medical history significant for hypertension, hyperlipidemia, hepatitis C, left hemispheric stroke, diabetes mellitus, schizophrenia and dementia who lives in a SNF at then well transferred from [facility] for management of status epilepticus.  Patient was not waking up after stopping sedation and therefore transferred to [facility] due to request of family.  EEG obtained did not show any seizures. On examination, when patient arrived was noted to have disconjugate gaze with right eye  exotropia and larger right pupil, right gaze preference.  Stat CT head was obtained which showed no acute abnormality.  Because of his history of status epilepticus stat EEG was obtained to rule out nonconvulsive status epilepticus.  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  Continue to hold sedation and agree with dialysis Avoid cefepime if possible, currently on meropenem   Reviewed EEG : Negative for seizures, patient appears to have low voltage generalized slowing with prominent  EKG artifact and muscle artifact.  Patient is not in nonconvulsive status epilepticus.  It is possible that he still needs time to clear sedation given his acute renal failure.  We will continue to follow.  An MRI brain will be very helpful and can be obtained once EEG leads removed which will be at the discretion of incoming neurologist   [place] Pager Number [phone]

## 2019-04-01 NOTE — Progress Notes (Signed)
Pharmacy Antibiotic Note  Chad Mcclure is a 63 y.o. male admitted on 03/31/2019 with pneumonia.  [name] cultures are growing moderate staph aureus - the sensitivities should return tomorrow. Pt is ESRD on HD - TuThSat  Plan: -Meropenem 500 mg IV q24h -Vancomycin 2 g IV x1 then 1 g IV qHD  Height: 6' (182.9 cm) Weight: 203 lb 14.8 oz (92.5 kg) IBW/kg (Calculated) : 77.6  Temp (24hrs), Avg:99.7 F (37.6 C), Min:99.2 F (37.3 C), Max:100.4 F (38 C)  Recent Labs  Lab 03/31/19 2317 04/01/19 0228  WBC 10.9* 10.9*  CREATININE 5.96* 6.02*    Estimated Creatinine Clearance: 13.8 mL/min (A) (by C-G formula based on SCr of 6.02 mg/dL (H)).    Not on File   Harvel Quale 04/01/2019 12:49 PM

## 2019-04-01 NOTE — Consult Note (Signed)
[facility]  INPATIENT CONSULTATION  Reason for Consultation: AKI Requesting Provider: Dr. Denese KillingsAgarwala  HPI: Chad Mcclure is an 63 y.o. male with HTN, HLD, HCV, h/o L CVA, DM schizophrenia, dementia, SNF resident who is [facility] for concern for status epilepticus and is seen by nephrology for evaluation and mgmt of AKI.    [place] to ED 8/20 with fevers.  Admitted for PNA and UTI - treated with vanc, zosyn.  Developed VDRF, AKI requiring RRT - HD started 8/24 via RIJ TDC (was being dialyzed on TTS schedule there).  Aspirated but was able to be extubated 8/26 but then in setting of seizure and status epilepticus was reintubated 8/31.  Mental status remained poor and he was [facility] last night.    CT here negative for acute issue. EEG here today neg for seizure.  I/Os since admission ~1400 / 1350mL (all UOP).   PMH: HTN, HLD, HCV, h/o L CVA, DM schizophrenia, dementia  PSH: No surgical history documented  Medications:  I have reviewed the patient's current medications. NS 75/hr Meropenem, vanc Keppra, vimpat, PRN ativan protonix Labetalol PRN Insulin Heparin SQ TID  No medications prior to admission.    ALLERGIES:  Not on File  FAM HX: No family history on file.  Social History:   has no history on file for tobacco, alcohol, and drug.  ROS: unable to obtain from patient given mental status  Blood pressure 120/75, pulse 94, temperature 99.6 F (37.6 C), temperature source Axillary, resp. rate 20, height 6' (1.829 m), weight 92.5 kg, SpO2 95 %. PHYSICAL EXAM: Gen: intubated, slumped to right side in bed at 45 degrees  Eyes: sclera anicteric ENT: ETT and OG in place Neck: L IJ central line in place, RIJ Tunneled HD catheter c/d/i CV: RRR, no rub Abd:  Soft, nontender Lungs: on vent, coarse BS bilaterally, no rales GU: foley with clear yellow urine  Extr:  No edema Neuro: sedated Skin: warm and dry    Results for orders placed or performed during the hospital encounter of 03/31/19 (from the past 48 hour(s))  MRSA PCR Screening     Status: Abnormal   Collection Time: 03/31/19  9:42 PM   Specimen: Nasal Mucosa; Nasopharyngeal  Result Value Ref Range   MRSA by PCR POSITIVE (A) NEGATIVE    Comment:        The GeneXpert MRSA Assay (FDA approved for NASAL specimens only), is one component of a comprehensive MRSA colonization surveillance program. It is not intended to diagnose MRSA infection nor to guide or monitor treatment for MRSA infections. RESULT CALLED TO, READ BACK BY AND VERIFIED WITH: Rene KocherM PLUMMER RN [id number] 03/31/19 A BROWNING Performed at [facility], [address] [place], [place] [address]   I-STAT 7, (LYTES, BLD GAS, ICA, H+H)     Status: Abnormal   Collection Time: 03/31/19 10:53 PM  Result Value Ref Range   pH, Arterial 7.454 (H) 7.350 - 7.450   pCO2 arterial 31.5 (L) 32.0 - 48.0 mmHg   pO2, Arterial 80.0 (L) 83.0 - 108.0 mmHg   Bicarbonate 22.1 20.0 - 28.0 mmol/L   TCO2 23 22 - 32 mmol/L   O2 Saturation 96.0 %   Acid-base deficit 1.0 0.0 - 2.0 mmol/L   Sodium 139 135 - 145 mmol/L   Potassium 3.5 3.5 - 5.1 mmol/L   Calcium, Ion 1.14 (L) 1.15 - 1.40 mmol/L   HCT 29.0 (L) 39.0 -  52.0 %   Hemoglobin 9.9 (L) 13.0 - 17.0 g/dL   Patient temperature 98.6 F    Collection site RADIAL, ALLEN'S TEST ACCEPTABLE    Drawn by RT    Sample type ARTERIAL   Comprehensive metabolic panel     Status: Abnormal   Collection Time: 03/31/19 11:17 PM  Result Value Ref Range   Sodium 139 135 - 145 mmol/L   Potassium 3.7 3.5 - 5.1 mmol/L   Chloride 103 98 - 111 mmol/L   CO2 22 22 - 32 mmol/L   Glucose, Bld 91 70 - 99 mg/dL   BUN 47 (H) 8 - 23 mg/dL   Creatinine, Ser 5.96 (H) 0.61 - 1.24 mg/dL   Calcium 8.4 (L) 8.9 - 10.3 mg/dL   Total Protein 6.7 6.5 - 8.1 g/dL   Albumin 1.9 (L) 3.5 - 5.0 g/dL   AST 63 (H) 15 - 41 U/L   ALT 81 (H) 0 - 44 U/L   Alkaline Phosphatase 52 38 -  126 U/L   Total Bilirubin 0.8 0.3 - 1.2 mg/dL   GFR calc non Af Amer 9 (L) >60 mL/min   GFR calc Af Amer 11 (L) >60 mL/min   Anion gap 14 5 - 15    Comment: Performed at [facility], [address], [place], [place] [address]  CBC     Status: Abnormal   Collection Time: 03/31/19 11:17 PM  Result Value Ref Range   WBC 10.9 (H) 4.0 - 10.5 K/uL   RBC 3.44 (L) 4.22 - 5.81 MIL/uL   Hemoglobin 9.4 (L) 13.0 - 17.0 g/dL   HCT 27.5 (L) 39.0 - 52.0 %   MCV 79.9 (L) 80.0 - 100.0 fL   MCH 27.3 26.0 - 34.0 pg   MCHC 34.2 30.0 - 36.0 g/dL   RDW 15.0 11.5 - 15.5 %   Platelets 312 150 - 400 K/uL   nRBC 0.0 0.0 - 0.2 %    Comment: Performed at [facility], [place] [address] [place],  [address]  Hemoglobin A1c     Status: Abnormal   Collection Time: 03/31/19 11:30 PM  Result Value Ref Range   Hgb A1c MFr Bld 7.0 (H) 4.8 - 5.6 %    Comment: (NOTE) Pre diabetes:          5.7%-6.4% Diabetes:              >6.4% Glycemic control for   <7.0% adults with diabetes    Mean Plasma Glucose 154.2 mg/dL    Comment: Performed at [place] [address] [place], [place] [address]  Glucose, capillary     Status: None   Collection Time: 04/01/19 12:03 AM  Result Value Ref Range   Glucose-Capillary 87 70 - 99 mg/dL  CBC     Status: Abnormal   Collection Time: 04/01/19  2:28 AM  Result Value Ref Range   WBC 10.9 (H) 4.0 - 10.5 K/uL   RBC 3.52 (L) 4.22 - 5.81 MIL/uL   Hemoglobin 9.6 (L) 13.0 - 17.0 g/dL   HCT 28.1 (L) 39.0 - 52.0 %   MCV 79.8 (L) 80.0 - 100.0 fL   MCH 27.3 26.0 - 34.0 pg   MCHC 34.2 30.0 - 36.0 g/dL   RDW 15.1 11.5 - 15.5 %   Platelets 315 150 - 400 K/uL   nRBC 0.0 0.0 - 0.2 %    Comment: Performed at [facility], [place]  [address] [place], [place] [address]  Basic metabolic panel     Status: Abnormal   Collection Time: 04/01/19  2:28 AM  Result Value Ref Range   Sodium 138 135 - 145 mmol/L   Potassium 3.9 3.5 - 5.1 mmol/L   Chloride 102 98 - 111  mmol/L   CO2 21 (L) 22 - 32 mmol/L   Glucose, Bld 91 70 - 99 mg/dL   BUN 49 (H) 8 - 23 mg/dL   Creatinine, Ser 4.786.02 (H) 0.61 - 1.24 mg/dL   Calcium 8.2 (L) 8.9 - 10.3 mg/dL   GFR calc non Af Amer 9 (L) >60 mL/min   GFR calc Af Amer 11 (L) >60 mL/min   Anion gap 15 5 - 15    Comment: Performed at [facility], [address] [place], [place] [address]  Magnesium     Status: None   Collection Time: 04/01/19  2:28 AM  Result Value Ref Range   Magnesium 2.2 1.7 - 2.4 mg/dL    Comment: Performed at [facility], [address] [place], [place] [address]  Phosphorus     Status: Abnormal   Collection Time: 04/01/19  2:28 AM  Result Value Ref Range   Phosphorus 4.7 (H) 2.5 - 4.6 mg/dL    Comment: Performed at [facility], [address] [place], [place] [address]  Glucose, capillary     Status: None   Collection Time: 04/01/19  3:01 AM  Result Value Ref Range   Glucose-Capillary 85 70 - 99 mg/dL  I-STAT 7, (LYTES, BLD GAS, ICA, H+H)     Status: Abnormal   Collection Time: 04/01/19  4:49 AM  Result Value Ref Range   pH, Arterial 7.440 7.350 - 7.450   pCO2 arterial 31.9 (L) 32.0 - 48.0 mmHg   pO2, Arterial 70.0 (L) 83.0 - 108.0 mmHg   Bicarbonate 21.5 20.0 - 28.0 mmol/L   TCO2 22 22 - 32 mmol/L   O2 Saturation 94.0 %   Acid-base deficit 2.0 0.0 - 2.0 mmol/L   Sodium 141 135 - 145 mmol/L   Potassium 3.8 3.5 - 5.1 mmol/L   Calcium, Ion 1.16 1.15 - 1.40 mmol/L   HCT 33.0 (L) 39.0 - 52.0 %   Hemoglobin 11.2 (L) 13.0 - 17.0 g/dL   Patient temperature 96.299.7 F    Collection site RADIAL, ALLEN'S TEST ACCEPTABLE    Drawn by RT    Sample type ARTERIAL   Glucose, capillary     Status: None   Collection Time: 04/01/19  8:34 AM  Result Value Ref Range   Glucose-Capillary 71 70 - 99 mg/dL  Magnesium     Status: None   Collection Time: 04/01/19 11:35 AM  Result Value Ref Range   Magnesium 2.1 1.7 - 2.4 mg/dL    Comment: Performed at [facility], [address] [place], [place] [address]  Phosphorus     Status: None   Collection Time: 04/01/19 11:35 AM  Result Value Ref Range   Phosphorus 4.4 2.5 - 4.6 mg/dL    Comment: Performed at [facility], [address] [place], [place] [address]  Glucose, capillary     Status: None   Collection Time: 04/01/19 11:40 AM  Result Value Ref Range   Glucose-Capillary 89 70 - 99 mg/dL    Ct Head Wo Contrast  Result Date: 03/31/2019 CLINICAL DATA:  Initial evaluation for acute altered mental status. EXAM: CT HEAD WITHOUT CONTRAST  TECHNIQUE: Contiguous axial images were obtained from the base of the skull through the vertex without intravenous contrast. COMPARISON:  Prior CT from 12/16/2010. FINDINGS: Brain: Moderate to advanced cerebral atrophy with chronic microvascular ischemic disease. No acute intracranial hemorrhage. No acute large vessel territory infarct. No mass lesion, midline shift or mass effect. Diffuse ventricular prominence related to global parenchymal volume loss of hydrocephalus. No extra-axial fluid collection. Vascular: No hyperdense vessel. Scattered vascular calcifications noted within the carotid siphons. Skull: Scalp soft tissues and calvarium within normal limits. Sinuses/Orbits: Globes orbital soft tissues within normal limits. Scattered mucosal thickening noted throughout the paranasal sinuses. Superimposed air-fluid levels noted within the sphenoid sinuses and right maxillary sinus. Fluid seen layering within the nasopharynx. Trace bilateral mastoid effusions. Patient is likely intubated. Other: None. IMPRESSION: 1. No acute intracranial abnormality. 2. Moderately advanced cerebral atrophy with chronic microvascular ischemic disease. Electronically Signed   By: Rise Mu M.D.   On: 03/31/2019 23:57   Dg Chest Port 1 View  Result Date: 04/01/2019 CLINICAL DATA:  PICC line placement, respiratory failure EXAM: PORTABLE CHEST 1 VIEW COMPARISON:  Most recently December 28, 2010 FINDINGS:  Endotracheal tube terminates 3.4 cm from the carina. Transesophageal tube tip and side port distal to the GE junction. Right IJ approach dual lumen catheter tip terminates at the right atrium. Coarse interstitial and airspace opacities are present throughout the lungs, new from prior studies. No pneumothorax or effusion. Cardiomediastinal contours are unchanged from comparison studies with a calcified tortuous aorta. Lobular appearance in the low mediastinum could reflect a hiatal hernia versus atrial enlargement, difficult to ascertain on a portable frontal only radiograph. IMPRESSION: Appropriate positioning of lines and tubes, as above. Coarse interstitial and airspace opacities throughout the lungs, new from prior studies. Could reflect a developing infectious process. Electronically Signed   By: Kreg Shropshire M.D.   On: 04/01/2019 02:50    Assessment/Plan **Encephalopathy:  Concern for ongoing seizures though EEG negative this AM.  Neurology following.  Dosing keppra and vimpat for renal function.  Doesn't appear to have improved with dialysis but will provide dialytic support as needed here to avoid confounding uremia.   **AKI:  Severe at OSH, requiring dialysis initiation in setting of oliguria per chart.  Unclear baseline - need to track down records.  Nonoliguric here to date.  Cr 6 and he likely will require ongoing dialytic support for now, but no acute indications today.  Will plan for HD tomorrow but will monitor closely for renal recovery.   Check UA here. Hopefully can find records to ensure renal imaging done in setting of AKI, otherwise would repeat here.  Hold MIVF for now, can do small boluses PRN for now.   **VDRF:  Primary working towards weaning as able.  On meropenem for PNA.  **HTN: BPs here 120-160s.  Has PRN labetalol for now. Would like to avoid hypotension in face of resolving AKI.  **Anemia:  Hb this AM 11.2. No indications for ESA.  Per Primary.    **malnutrition: on tube  feeds  **DM:  On SSI.   Tyler Pita 04/01/2019, 2:07 PM

## 2019-04-01 NOTE — Progress Notes (Signed)
Pharmacy Antibiotic Note  Chad Mcclure is a 63 y.o. male admitted on 03/31/2019 with pneumonia.  Pharmacy has been consulted for zosyn dosing. He was actually placed on meropenem before transferring to [facility] (due to worsening CXR)  Plan: Zosyn 3.375 gm IV x 1 F/u with team in am if needs to be on meropenem. F/u cultures from [facility]  Height: 6' (182.9 cm) IBW/kg (Calculated) : 77.6  Temp (24hrs), Avg:99.2 F (37.3 C), Min:99.2 F (37.3 C), Max:99.2 F (37.3 C)  Recent Labs  Lab 03/31/19 2317  WBC 10.9*  CREATININE 5.96*    CrCl cannot be calculated (Unknown ideal weight.).    Not on File  Thank you for allowing pharmacy to be a part of this patient's care.  Chad Mcclure, [place] 04/01/2019 12:50 AM

## 2019-04-01 NOTE — Progress Notes (Signed)
vLTM started neurology notified 

## 2019-04-01 NOTE — Progress Notes (Signed)
MD notified of patient's pupil changes and forced right gaze. Pt. Taken for stat CT. RN will continue to monitor.

## 2019-04-01 NOTE — Progress Notes (Signed)
L neck TL IJ assessed. Blood return noted all three lumen. RN had just redressed line. Approx 2cm catheter exposed (visible under dressing). Site otherwise unremarkable. TPA order canceled.

## 2019-04-01 NOTE — Progress Notes (Signed)
Patient seen earlier by Dr. Malen Gauze.  He continues to be somnolent, not following commands.  No seizures on EEG, I would favor getting an MRI and therefore we will discontinue the EEG at this time.  Roland Rack, MD [facility] [phone]  If 7pm- 7am, please page neurology on call as listed in [place].

## 2019-04-01 NOTE — Progress Notes (Signed)
RT obtained ABG on pt with the following results and changes per [facility] NP. Reduce rate to 18 from 22 per ABG results. Pt respiratory status is stable at this time on the vent.  RT will continue to monitor.  Ph 7.459 PCO2  28.6 HCO3 20.3 PaO2 75 SaO2 96

## 2019-04-01 NOTE — Progress Notes (Signed)
Initial Nutrition Assessment  DOCUMENTATION CODES:   Not applicable  INTERVENTION:   - If pt unable to tolerate gastric feeding due to high aspiration risk, recommend placement of post-pyloric Cortrak. Cortrak team is available Mondays and Fridays from 0800 to 1600.  Tube feeding: - Start Vital 1.5 @ 20 ml/hr and titrate by 10 ml q 4 hours until goal rate of 50 ml/hr is reached (1200 ml/day) - Pro-stat 30 ml TID  Tube feeding regimen provides 2100 kcal, 126 grams of protein, and 917 ml of H2O.   NUTRITION DIAGNOSIS:   Inadequate oral intake related to inability to eat as evidenced by NPO status.  GOAL:   Patient will meet greater than or equal to 90% of their needs  MONITOR:   Vent status, Labs, Weight trends, TF tolerance, I & O's  REASON FOR ASSESSMENT:   Ventilator, Consult Enteral/tube feeding initiation and management  ASSESSMENT:   63 year old male who presented on 9/02 from [facility] (admitted 8/20) with fevers suspected to be related to sepsis secondary to pneumonia and cystitis. Pt was intubated 8/21 and failed extubation after gross aspiration and status epilepticus which prompted [facility]. PMH of HTN, HLD, left CVA, DM, hepatitis C, schizophrenia, dementia.   Pt with AKI on iHD.  Spoke with RN. Per RN, pt with high aspiration risk. Will start tube feeds at trickle rate and place titration orders. RN aware. If pt unable to tolerate gastric feeds, recommend placement of post-pyloric Cortrak.  Patient is currently intubated on ventilator support MV: 12.7 L/min Temp (24hrs), Avg:99.5 F (37.5 C), Min:99.2 F (37.3 C), Max:99.7 F (37.6 C)  Drips: NS: 75 ml/hr  Medications reviewed and include: SSI q 4 hours, Protonix, IV Vimpat, IV Keppra, IV abx  Labs reviewed: BUN 49, creatinine 6.02, phosphorus 4.7, elevated LFTs, hemoglobin A1C 7.0 CBG's: 71-87  NUTRITION - FOCUSED PHYSICAL EXAM:    Most Recent Value  Orbital Region  Mild depletion   Upper Arm Region  No depletion  Thoracic and Lumbar Region  No depletion  Buccal Region  No depletion  Temple Region  No depletion  Clavicle Bone Region  Mild depletion  Clavicle and Acromion Bone Region  Mild depletion  Scapular Bone Region  Unable to assess  Dorsal Hand  No depletion  Patellar Region  No depletion  Anterior Thigh Region  No depletion  Posterior Calf Region  Mild depletion  Edema (RD Assessment)  None  Hair  Reviewed  Eyes  Reviewed  Mouth  Reviewed  Skin  Reviewed  Nails  Reviewed       Diet Order:   Diet Order            Diet NPO time specified  Diet effective now              EDUCATION NEEDS:   No education needs have been identified at this time  Skin:  Skin Assessment: Reviewed RN Assessment  Last BM:  no documented BM  Height:   Ht Readings from Last 1 Encounters:  03/31/19 6' (1.829 m)    Weight:   Wt Readings from Last 1 Encounters:  04/01/19 92.5 kg    Ideal Body Weight:  80.9 kg  BMI:  Body mass index is 27.66 kg/m.  Estimated Nutritional Needs:   Kcal:  2153  Protein:  115-130 grams  Fluid:  >/= 1.8 L    Gaynell Face, MS, RD, LDN Inpatient Clinical Dietitian Pager: [phone] Weekend/After Hours: [phone]

## 2019-04-01 NOTE — Progress Notes (Addendum)
Patient transported to CT and back to 4N15 with no complications. 

## 2019-04-01 NOTE — Progress Notes (Signed)
EEG Maint complete. No skin breakdown. Continue to monitor °

## 2019-04-01 NOTE — Progress Notes (Signed)
vLTM discontinued  No skin breakdown 

## 2019-04-01 NOTE — Progress Notes (Signed)
RN updated patient's daughter [name] on status. MD spoke with daughter over the phone as well. She was educated about visitation. This daughter is the POA for the patient. She will provide paperwork.

## 2019-04-01 NOTE — Progress Notes (Signed)
RN notified MD of Left PICC IJ being hard to flush and unable to draw back blood. Xray ordered. RN will continue to monitor.

## 2019-04-02 ENCOUNTER — Inpatient Hospital Stay (HOSPITAL_COMMUNITY): Payer: Medicaid Other

## 2019-04-02 LAB — GLUCOSE, CAPILLARY
Glucose-Capillary: 91 mg/dL (ref 70–99)
Glucose-Capillary: 126 mg/dL — ABNORMAL HIGH (ref 70–99)
Glucose-Capillary: 115 mg/dL — ABNORMAL HIGH (ref 70–99)
Glucose-Capillary: 127 mg/dL — ABNORMAL HIGH (ref 70–99)
Glucose-Capillary: 121 mg/dL — ABNORMAL HIGH (ref 70–99)
Glucose-Capillary: 126 mg/dL — ABNORMAL HIGH (ref 70–99)

## 2019-04-02 LAB — MAGNESIUM
Magnesium: 2.3 mg/dL (ref 1.7–2.4)
Magnesium: 2.3 mg/dL (ref 1.7–2.4)
Magnesium: 2.3 mg/dL (ref 1.7–2.4)

## 2019-04-02 LAB — RENAL FUNCTION PANEL
Albumin: 1.9 g/dL — ABNORMAL LOW (ref 3.5–5.0)
Anion gap: 12 (ref 5–15)
BUN: 58 mg/dL — ABNORMAL HIGH (ref 8–23)
CO2: 22 mmol/L (ref 22–32)
Calcium: 8.3 mg/dL — ABNORMAL LOW (ref 8.9–10.3)
Chloride: 113 mmol/L — ABNORMAL HIGH (ref 98–111)
Creatinine, Ser: 6.39 mg/dL — ABNORMAL HIGH (ref 0.61–1.24)
GFR calc Af Amer: 10 mL/min — ABNORMAL LOW (ref >60)
GFR calc non Af Amer: 8 mL/min — ABNORMAL LOW (ref >60)
Glucose, Bld: 95 mg/dL (ref 70–99)
Phosphorus: 4.8 mg/dL — ABNORMAL HIGH (ref 2.5–4.6)
Potassium: 3.5 mmol/L (ref 3.5–5.1)
Sodium: 147 mmol/L — ABNORMAL HIGH (ref 135–145)

## 2019-04-02 LAB — TSH: TSH: 1.838 u[IU]/mL (ref 0.350–4.500)

## 2019-04-02 LAB — AMMONIA: Ammonia: 17 umol/L (ref 9–35)

## 2019-04-02 LAB — PHOSPHORUS
Phosphorus: 3.9 mg/dL (ref 2.5–4.6)
Phosphorus: 4.6 mg/dL (ref 2.5–4.6)

## 2019-04-02 LAB — CALCIUM, IONIZED: Calcium, Ionized, Serum: 4.6 mg/dL (ref 4.5–5.6)

## 2019-04-02 NOTE — Progress Notes (Signed)
NAME:  Chad Mcclure, MRN:  [id number], DOB:  04/08/1956, LOS: 2 ADMISSION DATE:  03/31/2019, CONSULTATION DATE:  03/31/19 REFERRING MD:  [facility]  CHIEF COMPLAINT:  Status epilepticus   Brief History   Chad Mcclure is a 63 y.o. male who was initially admitted to [name] 8/20 with fevers felt to be due to PNA and cystitis. Required intubation 8/21 for hypoxic respiratory failure.  Extubated 8/29 then required re-intubation 8/31 for status epilepticus. Transferred to [facility] 9/2.  History of present illness   Pt is encephelopathic; therefore, this HPI is obtained from chart review. Chad Mcclure is a 63 y.o. male who has a PMH including but not limited to HTN, HLD, HCV, Left CVA, DM, schizophrenia, dementia and who resides at [facility] in [place] (see "past medical history" for rest).  He was admitted to [facility] on 03/18/19 with fever.  He was started on vanc / zosyn for suspected PNA and cystitis.  See below for a list of events from the hospitalization: 8/21, rapid response was activated for hypoxic respiratory failure and pt was transferred to the ICU where he required intubation.   Was on propofol initially which was later switched to fentanyl due to hypertriglyceridemia. 8/24, he required initiation of hemodialysis after failing a lasix challenge, TTS schedule planned (R subclavian perm cath placed). 8/26, he had a witnessed aspiration event, zosyn continued.   8/29 he passed SBT and was extubated. 8/31 had seizures and status epilepticus requiring re-intubation.  Treated with ativan, vimpat, keppra. 9/1 EEG negative for seizures. 9/2 propofol discontinued but pt remained encephalopathic / unresponsive.  Later transferred to [facility] for further evaluation and management.    Past Medical History  HTN, HLD, HCV, Left CVA, DM, schizophrenia, dementia.  Significant Hospital Events   8/20 > admit. 8/21 > intubation. 8/24 > HD started. 8/29 > extubated. 8/31 > seizures /  status. 9/2 > transferred to [facility].  Consults:  Neurology. Nephrology.  Procedures:  ETT 8/21 > 8/29.  8/31 >  [facility] 8/24 >  L IJ CVL 8/22 >   Significant Diagnostic Tests:  EEG 9/1 > neg. CT head 9/2 >   Micro Data:  SARS CoV2 8/20 > neg.  Antimicrobials:  Zosyn 9/2 >   Sputum (from [place]) >   Interim history/subjective:  Completely unresponsive, no events overnight, no new changes  Objective:  Blood pressure (!) 151/79, pulse 97, temperature 99.5 F (37.5 C), temperature source Axillary, resp. rate (!) 21, height 6' (1.829 m), weight 88.3 kg, SpO2 100 %.    Vent Mode: CPAP;PSV FiO2 (%):  [40 %] 40 % Set Rate:  [18 bmp-22 bmp] 18 bmp Vt Set:  [620 mL] 620 mL PEEP:  [5 cmH20] 5 cmH20 Pressure Support:  [5 cmH20] 5 cmH20 Plateau Pressure:  [16 cmH20-24 cmH20] 24 cmH20   Intake/Output Summary (Last 24 hours) at 04/02/2019 1003 Last data filed at 04/02/2019 0600 Gross per 24 hour  Intake 1046.59 ml  Output 2200 ml  Net -1153.41 ml   Filed Weights   04/01/19 0500 04/02/19 0500  Weight: 92.5 kg 88.3 kg    Examination: General: Chronically ill appearing male, NAD Neuro: Unresponsive.  Withdraws RUE and RLE to pain.  Right sided gaze. HEENT: [place]/AT, PERRL, EOM-I and MMM, ETT in place Cardiovascular: RRR, Nl S1/S2 and -M/R/G Lungs: CTA bilaterally Abdomen: BS x 4, soft, NT/ND.  Musculoskeletal: No gross deformities, no edema.  Skin: Intact, warm, no rashes.  Assessment & Plan:  Acute hypoxic respiratory failure. - Begin PS trials but no extubation given mental status - Bronchial hygiene. - Follow CXR. - Titrate O2 for sat of 88-92  Aspiration PNA. - Continue zosyn. - Pharmacy to f/u on [place] cultures  Seizures with concern for status. - Continue vimpat, keppra, ativan PRN. - Neurology consulted.  AKI - started on HD on 8/24 (TTS schedule). - Day team to please consult nephrology. - Follow labs.  Protein calorie malnutrition. - Tube feeds per  nutrition.  Hx DM. - SSI.  Best Practice:  Diet: Tube feeds. Pain/Anxiety/Delirium protocol (if indicated): Fentanyl PRN / Midazolam PRN.  RASS goal 0. VAP protocol (if indicated): In place. DVT prophylaxis: SCD's / Heparin. GI prophylaxis: PPI. Glucose control: SSI. Mobility: Bedrest. Code Status: Full. Family Communication: None available. Disposition: ICU.  Labs   CBC: Recent Labs  Lab 03/31/19 2253 03/31/19 2317 04/01/19 0228 04/01/19 0449 04/01/19 2129  WBC  --  10.9* 10.9*  --   --   HGB 9.9* 9.4* 9.6* 11.2* 8.5*  HCT 29.0* 27.5* 28.1* 33.0* 25.0*  MCV  --  79.9* 79.8*  --   --   PLT  --  312 315  --   --    Basic Metabolic Panel: Recent Labs  Lab 03/31/19 2317 04/01/19 0228 04/01/19 0449 04/01/19 1135 04/01/19 2129 04/01/19 2316 04/02/19 0634  NA 139 138 141  --  146*  --  147*  K 3.7 3.9 3.8  --  3.5  --  3.5  CL 103 102  --   --   --   --  113*  CO2 22 21*  --   --   --   --  22  GLUCOSE 91 91  --   --   --   --  95  BUN 47* 49*  --   --   --   --  58*  CREATININE 5.96* 6.02*  --   --   --   --  6.39*  CALCIUM 8.4* 8.2*  --   --   --   --  8.3*  MG  --  2.2  --  2.1  --  2.3 2.3  PHOS  --  4.7*  --  4.4  --  3.9 4.8*   GFR: Estimated Creatinine Clearance: 13 mL/min (A) (by C-G formula based on SCr of 6.39 mg/dL (H)). Recent Labs  Lab 03/31/19 2317 04/01/19 0228  WBC 10.9* 10.9*   Liver Function Tests: Recent Labs  Lab 03/31/19 2317 04/02/19 0634  AST 63*  --   ALT 81*  --   ALKPHOS 52  --   BILITOT 0.8  --   PROT 6.7  --   ALBUMIN 1.9* 1.9*   No results for input(s): LIPASE, AMYLASE in the last 168 hours. No results for input(s): AMMONIA in the last 168 hours. ABG    Component Value Date/Time   PHART 7.459 (H) 04/01/2019 2129   PCO2ART 28.6 (L) 04/01/2019 2129   PO2ART 75.0 (L) 04/01/2019 2129   HCO3 20.3 04/01/2019 2129   TCO2 21 (L) 04/01/2019 2129   ACIDBASEDEF 3.0 (H) 04/01/2019 2129   O2SAT 96.0 04/01/2019 2129     Coagulation Profile: No results for input(s): INR, PROTIME in the last 168 hours. Cardiac Enzymes: No results for input(s): CKTOTAL, CKMB, CKMBINDEX, TROPONINI in the last 168 hours. HbA1C: Hgb A1c MFr Bld  Date/Time Value Ref Range Status  03/31/2019 11:30 PM 7.0 (H) 4.8 - 5.6 % Final  Comment:    (NOTE) Pre diabetes:          5.7%-6.4% Diabetes:              >6.4% Glycemic control for   <7.0% adults with diabetes   12/16/2010 04:00 AM  <5.7 % Final   5.6 (NOTE)                                                                       According to the ADA Clinical Practice Recommendations for 2011, when HbA1c is used as a screening test:   >=6.5%   Diagnostic of Diabetes Mellitus           (if abnormal result  is confirmed)  5.7-6.4%   Increased risk of developing Diabetes Mellitus  References:Diagnosis and Classification of Diabetes Mellitus,Diabetes Care,2011,34(Suppl 1):S62-S69 and Standards of Medical Care in         Diabetes - 2011,Diabetes Care,2011,34  (Suppl 1):S11-S61.   CBG: Recent Labs  Lab 04/01/19 1140 04/01/19 1619 04/01/19 1906 04/02/19 0303 04/02/19 0851  GLUCAP 89 76 105* 91 126*   The patient is critically ill with multiple organ systems failure and requires high complexity decision making for assessment and support, frequent evaluation and titration of therapies, application of advanced monitoring technologies and extensive interpretation of multiple databases.   Critical Care Time devoted to patient care services described in this note is  32  Minutes. This time reflects time of care of this signee Dr Koren BoundWesam Yacoub. This critical care time does not reflect procedure time, or teaching time or supervisory time of PA/NP/Med student/Med Resident etc but could involve care discussion time.  Alyson ReedyWesam G. Yacoub, M.D. [facility]. Pager: [phone]. After hours pager: [phone].

## 2019-04-02 NOTE — Progress Notes (Signed)
Subjective: No significant changes  I had a long conversation with the daughter, he does have some difficulty with slowed cognition, but is able to carry on a conversation at baseline.  She was not aware of a diagnosis of dementia or of schizophrenia.  Exam: Vitals:   04/02/19 1000 04/02/19 1112  BP: (!) 149/81   Pulse: (!) 101 96  Resp: 19 20  Temp:    SpO2: 100% 100%   Gen: In bed, NAD Resp: non-labored breathing, no acute distress Abd: soft, nt  Neuro: MS: Opens eyes to voice, eyes are disconjugate CN: Right pupil is slightly larger and sluggish, left pupil is brisk, right eye is slightly down and outwardly deviated. Motor: He has a chronic left hemiparesis, moves right side because I personally Sensory: Responds knock stimulation x4 DTR: Slightly brisker on the left than right  Pertinent Labs: Creatinine 6.39  MRI reviewed-he has significant chronic vascular disease without acute findings  Impression: 63 year old male with persistent encephalopathy status post seizures and respiratory failure due to pneumonia at outside hospital.  Possibilities include continued slow metabolization of sedating medications due to renal failure, ICU delirium, though I would also like to rule out other organic causes such as hyperammonemia or thyroid dysfunction.  There have been no seizure seen since arrival either clinically or electrographically.  Recommendations: 1) continue Keppra 500 twice daily 2) continue lacosamide 100 twice daily 3) TSH, ammonia  Roland Rack, MD [facility] [phone]  If 7pm- 7am, please page neurology on call as listed in [place].

## 2019-04-02 NOTE — Progress Notes (Signed)
[facility] NEPHROLOGY PROGRESS NOTE  Assessment/ Plan: Pt is a 63 y.o. yo male with HTN, HLD, HCV, h/o L CVA, DM schizophrenia, dementia, SNF resident who is transferred to [facility] for concern for status epilepticus, consulted for management of AKI. He presented from SNF in [place] to ED 8/20 with fevers.  Admitted for PNA and UTI - treated with vanc, zosyn.  Developed VDRF, AKI requiring RRT - HD started 8/24 via [facility] TDC (was being dialyzed on TTS schedule there).  #Acute kidney injury, nonoliguric: Severe AKI at OSH thought to be due to contrast nephropathy therefore started dialysis.  Per chart the ANA, anti-GBM and ANCA were negative.  He has good urine output, around 2.4 L in 24 hours and serum creatinine level remains around 6.  He may be having renal recovery.  Volume acceptable.  We will hold off dialysis today and  daily assessment for dialysis need.  UA with some protein but no RBC or WBC.  I will order kidney ultrasound to evaluate the echogenicity.  #Encephalopathy concerning for status epilepticus: Seen by neurology.  EEG with no seizure.  MRI showed advanced cerebral atrophy and ischemic change.  #Ventilator dependent respiratory failure: On antibiotics for pneumonia.  Currently on 40% FiO2 and PEEP 5.  Per PCCM.  #Hypertension: Monitor blood pressure.  Avoid hypotensive episode.  #Aspiration pneumonia: On Vanco and Zosyn.  Discussed with ICU nurse.  Subjective: Seen and examined in ICU.  Remains sedated and intubated.  Respiratory status stable on vent.  No edema.  Has good urine output Objective Vital signs in last 24 hours: Vitals:   04/02/19 1112 04/02/19 1200 04/02/19 1300 04/02/19 1400  BP:  128/89 (!) 153/78 (!) 148/81  Pulse: 96 99 96 97  Resp: 20 (!) 21 19 19   Temp:  100 F (37.8 C)    TempSrc:  Axillary    SpO2: 100% 100% 100% 100%  Weight:      Height:       Weight change: -4.2 kg  Intake/Output Summary (Last 24 hours) at 04/02/2019  1407 Last data filed at 04/02/2019 1300 Gross per 24 hour  Intake 1447.25 ml  Output 2400 ml  Net -952.75 ml       Labs: Basic Metabolic Panel: Recent Labs  Lab 03/31/19 2317  04/01/19 0228 04/01/19 0449 04/01/19 1135 04/01/19 2129 04/01/19 2316 04/02/19 0634  NA 139  --  138 141  --  146*  --  147*  K 3.7  --  3.9 3.8  --  3.5  --  3.5  CL 103  --  102  --   --   --   --  113*  CO2 22  --  21*  --   --   --   --  22  GLUCOSE 91  --  91  --   --   --   --  95  BUN 47*  --  49*  --   --   --   --  58*  CREATININE 5.96*  --  6.02*  --   --   --   --  6.39*  CALCIUM 8.4*  --  8.2*  --   --   --   --  8.3*  PHOS  --    < > 4.7*  --  4.4  --  3.9 4.8*   < > = values in this interval not displayed.   Liver Function Tests: Recent Labs  Lab 03/31/19 2317  04/02/19 0634  AST 63*  --   ALT 81*  --   ALKPHOS 52  --   BILITOT 0.8  --   PROT 6.7  --   ALBUMIN 1.9* 1.9*   No results for input(s): LIPASE, AMYLASE in the last 168 hours. No results for input(s): AMMONIA in the last 168 hours. CBC: Recent Labs  Lab 03/31/19 2317 04/01/19 0228 04/01/19 0449 04/01/19 2129  WBC 10.9* 10.9*  --   --   HGB 9.4* 9.6* 11.2* 8.5*  HCT 27.5* 28.1* 33.0* 25.0*  MCV 79.9* 79.8*  --   --   PLT 312 315  --   --    Cardiac Enzymes: No results for input(s): CKTOTAL, CKMB, CKMBINDEX, TROPONINI in the last 168 hours. CBG: Recent Labs  Lab 04/01/19 1619 04/01/19 1906 04/02/19 0303 04/02/19 0851 04/02/19 1232  GLUCAP 76 105* 91 126* 115*    Iron Studies: No results for input(s): IRON, TIBC, TRANSFERRIN, FERRITIN in the last 72 hours. Studies/Results: Dg Abd 1 View  Result Date: 04/02/2019 CLINICAL DATA:  MRI clearance. EXAM: ABDOMEN - 1 VIEW COMPARISON:  None. FINDINGS: Enteric tube is partially visualized. There are no radiopaque foreign bodies that would preclude MRI imaging. High-density bowel contents is consistent with ingested material. No bowel obstruction or free air.  IMPRESSION: No radiopaque foreign bodies that would preclude MRI imaging. Electronically Signed   By: Keith Rake M.D.   On: 04/02/2019 01:31   Ct Head Wo Contrast  Result Date: 03/31/2019 CLINICAL DATA:  Initial evaluation for acute altered mental status. EXAM: CT HEAD WITHOUT CONTRAST TECHNIQUE: Contiguous axial images were obtained from the base of the skull through the vertex without intravenous contrast. COMPARISON:  Prior CT from 12/16/2010. FINDINGS: Brain: Moderate to advanced cerebral atrophy with chronic microvascular ischemic disease. No acute intracranial hemorrhage. No acute large vessel territory infarct. No mass lesion, midline shift or mass effect. Diffuse ventricular prominence related to global parenchymal volume loss of hydrocephalus. No extra-axial fluid collection. Vascular: No hyperdense vessel. Scattered vascular calcifications noted within the carotid siphons. Skull: Scalp soft tissues and calvarium within normal limits. Sinuses/Orbits: Globes orbital soft tissues within normal limits. Scattered mucosal thickening noted throughout the paranasal sinuses. Superimposed air-fluid levels noted within the sphenoid sinuses and right maxillary sinus. Fluid seen layering within the nasopharynx. Trace bilateral mastoid effusions. Patient is likely intubated. Other: None. IMPRESSION: 1. No acute intracranial abnormality. 2. Moderately advanced cerebral atrophy with chronic microvascular ischemic disease. Electronically Signed   By: Jeannine Boga M.D.   On: 03/31/2019 23:57   Mr Brain Wo Contrast  Result Date: 04/02/2019 CLINICAL DATA:  Initial evaluation for acute encephalopathy. EXAM: MRI HEAD WITHOUT CONTRAST TECHNIQUE: Multiplanar, multiecho pulse sequences of the brain and surrounding structures were obtained without intravenous contrast. COMPARISON:  Prior CT from 03/31/2019. FINDINGS: Brain: Diffuse prominence of the CSF containing spaces compatible with generalized cerebral  atrophy, fairly advanced in nature. Extensive confluent T2/FLAIR hyperintensity throughout the periventricular deep white matter both cerebral hemispheres as well as the pons, consistent with advanced chronic microvascular ischemic disease. Multiple superimposed remote lacunar infarcts seen involving the hemispheric cerebral white matter as well as the thalami and pons. No abnormal foci of restricted diffusion to suggest acute or subacute ischemia. Gray-white matter differentiation maintained without evidence for chronic cortical infarction. No acute intracranial hemorrhage. Multiple scattered chronic micro hemorrhages noted clustered about the periventricular white matter and pons, most likely related to chronic underlying hypertension. No mass lesion, mass effect, or midline  shift. Diffuse ventricular prominence related to global parenchymal volume loss without hydrocephalus. No extra-axial fluid collection. Pituitary gland and suprasellar region normal. Midline structures intact. Vascular: Major intracranial vascular flow voids are maintained. Skull and upper cervical spine: Craniocervical junction within normal limits. Upper cervical spine normal. Bone marrow signal intensity within normal limits. No scalp soft tissue abnormality. Sinuses/Orbits: Globes and orbital soft tissues within normal limits. Scattered mucosal thickening noted throughout the paranasal sinuses. Superimposed air-fluid levels noted within the right maxillary and sphenoid sinuses. Fluid seen layering within the posterior nasopharynx. Patient likely intubated. Small bilateral mastoid effusions also likely related to intubation as well. Other: None. IMPRESSION: 1. No acute intracranial abnormality. 2. Advanced cerebral atrophy with chronic microvascular ischemic disease, with multiple remote lacunar infarcts involving the hemispheric cerebral white matter, thalami, and pons. 3. Multiple scattered chronic micro hemorrhages clustered about the  periventricular white matter and pons, most likely related to underlying poorly controlled hypertension. Electronically Signed   By: Jeannine Boga M.D.   On: 04/02/2019 02:52   Dg Chest Port 1 View  Result Date: 04/01/2019 CLINICAL DATA:  PICC line placement, respiratory failure EXAM: PORTABLE CHEST 1 VIEW COMPARISON:  Most recently December 28, 2010 FINDINGS: Endotracheal tube terminates 3.4 cm from the carina. Transesophageal tube tip and side port distal to the GE junction. Right IJ approach dual lumen catheter tip terminates at the right atrium. Coarse interstitial and airspace opacities are present throughout the lungs, new from prior studies. No pneumothorax or effusion. Cardiomediastinal contours are unchanged from comparison studies with a calcified tortuous aorta. Lobular appearance in the low mediastinum could reflect a hiatal hernia versus atrial enlargement, difficult to ascertain on a portable frontal only radiograph. IMPRESSION: Appropriate positioning of lines and tubes, as above. Coarse interstitial and airspace opacities throughout the lungs, new from prior studies. Could reflect a developing infectious process. Electronically Signed   By: Lovena Le M.D.   On: 04/01/2019 02:50    Medications: Infusions: . sodium chloride Stopped (04/02/19 0029)  . sodium chloride Stopped (04/02/19 0952)  . feeding supplement (VITAL 1.5 CAL) 40 mL/hr at 04/02/19 1404  . lacosamide (VIMPAT) IV Stopped (04/02/19 1023)  . levETIRAcetam Stopped (04/02/19 1116)  . meropenem (MERREM) IV Stopped (04/02/19 0729)  . [START ON 04/03/2019] vancomycin      Scheduled Medications: . chlorhexidine gluconate (MEDLINE KIT)  15 mL Mouth Rinse BID  . Chlorhexidine Gluconate Cloth  6 each Topical Daily  . Chlorhexidine Gluconate Cloth  6 each Topical Daily  . Chlorhexidine Gluconate Cloth  6 each Topical Daily  . Chlorhexidine Gluconate Cloth  6 each Topical Daily  . feeding supplement (PRO-STAT SUGAR FREE 64)   30 mL Per Tube TID  . heparin  5,000 Units Subcutaneous Q8H  . insulin aspart  0-9 Units Subcutaneous Q4H  . mouth rinse  15 mL Mouth Rinse 10 times per day  . mupirocin ointment  1 application Nasal BID  . pantoprazole sodium  40 mg Per Tube Daily    have reviewed scheduled and prn medications.  Physical Exam: General:NAD, comfortable Heart:RRR, s1s2 nl, no rubs Lungs: Coarse breath sound bilateral, no wheezing Abdomen:soft, Non-tender, non-distended Extremities:No LE edema Dialysis Access: Right chest dialysis catheter, placed at [place] 04/02/2019,2:07 PM  LOS: 2 days  Pager: [phone]

## 2019-04-03 ENCOUNTER — Inpatient Hospital Stay (HOSPITAL_COMMUNITY): Payer: Medicaid Other

## 2019-04-03 ENCOUNTER — Other Ambulatory Visit (HOSPITAL_COMMUNITY): Payer: Medicaid Other

## 2019-04-03 DIAGNOSIS — J1520 Pneumonia due to staphylococcus, unspecified: Secondary | ICD-10-CM

## 2019-04-03 LAB — VANCOMYCIN, RANDOM: Vancomycin Rm: 20

## 2019-04-03 LAB — PHOSPHORUS: Phosphorus: 4.6 mg/dL (ref 2.5–4.6)

## 2019-04-03 LAB — CULTURE, RESPIRATORY W GRAM STAIN

## 2019-04-03 LAB — CBC
HCT: 28.3 % — ABNORMAL LOW (ref 39.0–52.0)
Hemoglobin: 9.2 g/dL — ABNORMAL LOW (ref 13.0–17.0)
MCH: 27.1 pg (ref 26.0–34.0)
MCHC: 32.5 g/dL (ref 30.0–36.0)
MCV: 83.5 fL (ref 80.0–100.0)
Platelets: 316 10*3/uL (ref 150–400)
RBC: 3.39 MIL/uL — ABNORMAL LOW (ref 4.22–5.81)
RDW: 15.6 % — ABNORMAL HIGH (ref 11.5–15.5)
WBC: 7.2 10*3/uL (ref 4.0–10.5)
nRBC: 0 % (ref 0.0–0.2)

## 2019-04-03 LAB — BASIC METABOLIC PANEL
Anion gap: 13 (ref 5–15)
BUN: 65 mg/dL — ABNORMAL HIGH (ref 8–23)
CO2: 21 mmol/L — ABNORMAL LOW (ref 22–32)
Calcium: 8.3 mg/dL — ABNORMAL LOW (ref 8.9–10.3)
Chloride: 117 mmol/L — ABNORMAL HIGH (ref 98–111)
Creatinine, Ser: 5.92 mg/dL — ABNORMAL HIGH (ref 0.61–1.24)
GFR calc Af Amer: 11 mL/min — ABNORMAL LOW (ref >60)
GFR calc non Af Amer: 9 mL/min — ABNORMAL LOW (ref >60)
Glucose, Bld: 135 mg/dL — ABNORMAL HIGH (ref 70–99)
Potassium: 3.6 mmol/L (ref 3.5–5.1)
Sodium: 151 mmol/L — ABNORMAL HIGH (ref 135–145)

## 2019-04-03 LAB — GLUCOSE, CAPILLARY
Glucose-Capillary: 118 mg/dL — ABNORMAL HIGH (ref 70–99)
Glucose-Capillary: 171 mg/dL — ABNORMAL HIGH (ref 70–99)
Glucose-Capillary: 114 mg/dL — ABNORMAL HIGH (ref 70–99)
Glucose-Capillary: 126 mg/dL — ABNORMAL HIGH (ref 70–99)
Glucose-Capillary: 132 mg/dL — ABNORMAL HIGH (ref 70–99)
Glucose-Capillary: 156 mg/dL — ABNORMAL HIGH (ref 70–99)

## 2019-04-03 LAB — POCT I-STAT 7, (LYTES, BLD GAS, ICA,H+H)
Acid-base deficit: 3 mmol/L — ABNORMAL HIGH (ref 0.0–2.0)
Bicarbonate: 20.3 mmol/L (ref 20.0–28.0)
Calcium, Ion: 1.17 mmol/L (ref 1.15–1.40)
HCT: 29 % — ABNORMAL LOW (ref 39.0–52.0)
Hemoglobin: 9.9 g/dL — ABNORMAL LOW (ref 13.0–17.0)
O2 Saturation: 94 %
Patient temperature: 100.5
Potassium: 3.7 mmol/L (ref 3.5–5.1)
Sodium: 154 mmol/L — ABNORMAL HIGH (ref 135–145)
TCO2: 21 mmol/L — ABNORMAL LOW (ref 22–32)
pCO2 arterial: 29.7 mmHg — ABNORMAL LOW (ref 32.0–48.0)
pH, Arterial: 7.447 (ref 7.350–7.450)
pO2, Arterial: 71 mmHg — ABNORMAL LOW (ref 83.0–108.0)

## 2019-04-03 LAB — MAGNESIUM: Magnesium: 2.1 mg/dL (ref 1.7–2.4)

## 2019-04-03 MED ORDER — AMLODIPINE BESYLATE 10 MG PO TABS
10.0000 mg | ORAL_TABLET | Freq: Every day | ORAL | Status: DC
  Filled 2019-04-03: qty 1

## 2019-04-03 MED ORDER — HYDRALAZINE HCL 20 MG/ML IJ SOLN
10.0000 mg | INTRAMUSCULAR | Status: DC | PRN
  Administered 2019-04-04: 09:00:00 10 mg via INTRAVENOUS
  Filled 2019-04-03 (×2): qty 1

## 2019-04-03 MED ORDER — FREE WATER
400.0000 mL | Freq: Three times a day (TID) | Status: DC
  Administered 2019-04-03 (×2): 400 mL

## 2019-04-03 MED ORDER — VANCOMYCIN VARIABLE DOSE PER UNSTABLE RENAL FUNCTION (PHARMACIST DOSING): Status: DC

## 2019-04-03 MED ORDER — AMLODIPINE BESYLATE 10 MG PO TABS
10.0000 mg | ORAL_TABLET | Freq: Every day | ORAL | Status: DC
  Administered 2019-04-03 – 2019-04-24 (×20): 10 mg
  Filled 2019-04-03 (×19): qty 1

## 2019-04-03 NOTE — Progress Notes (Signed)
Subjective: Slightly more awake.  Exam: Vitals:   04/03/19 1113 04/03/19 1200  BP: (!) 147/69   Pulse: 94   Resp: 18   Temp:  98.9 F (37.2 C)  SpO2: 99%    Gen: In bed, NAD Resp: non-labored breathing, no acute distress Abd: soft, nt  Neuro: MS: Appears more awake today, actively engages the examiner with his eyes, does not clearly follow commands though?  Try to protrude his tongue to command CN: Right pupil is slightly larger and sluggish, left pupil is brisk, right eye is slightly down and outwardly deviated. Motor: He has a chronic left hemiparesis, moves right side because I personally Sensory: Responds to nox stimulation x4 DTR: Slightly brisker on the left than right  Pertinent Labs: Creatinine 6.39  Ammonia and TSH are normal  MRI reviewed-he has significant chronic vascular disease without acute findings  Impression: 63 year old male with persistent encephalopathy status post seizures and respiratory failure due to pneumonia at outside hospital.  Possibilities include continued slow metabolization of sedating medications due to renal failure, ICU delirium, or sequela of multiple frequent seizures.  The fact that he does appear to be improving day by day is reassuring.  The extraocular movement abnormalities are unusual, possibly diabetic nerve palsy, but I would favor ruling out P-comm aneurysm with MRA.  There have been no seizure seen since arrival either clinically or electrographically.  Of note, I had a conversation on 9/4 with his daughter who is not aware of a diagnosis of schizophrenia or dementia.  He does have some cognitive slowing due to his previous stroke, however.  Recommendations: 1) continue Keppra 500 twice daily 2) continue lacosamide 100 twice daily 3) MRA head to rule out P-comm aneurysm  Roland Rack, MD [facility] [phone]  If 7pm- 7am, please page neurology on call as listed in [place].

## 2019-04-03 NOTE — Progress Notes (Signed)
[facility] NEPHROLOGY PROGRESS NOTE  Assessment/ Plan: Pt is a 63 y.o. yo male with HTN, HLD, HCV, h/o L CVA, DM schizophrenia, dementia, SNF resident who is transferred to [facility] for concern for status epilepticus, consulted for management of AKI. He presented from SNF in [place] to ED 8/20 with fevers.  Admitted for PNA and UTI - treated with vanc, zosyn.  Developed VDRF, AKI requiring RRT - HD started 8/24 via [facility] (was being dialyzed on TTS schedule there).  #Acute kidney injury, nonoliguric: Severe AKI at OSH thought to be due to contrast nephropathy therefore started dialysis.  Per chart the ANA, anti-GBM and ANCA were negative.  He has good urine output, around 2200 cc in 24 hours and serum creatinine level trending down to 5.9 today.  He does not look volume overload and potassium level acceptable.  We will continue to hold dialysis today and daily assessment for dialysis need.  UA with some protein but no RBC or WBC.  I will order kidney ultrasound to evaluate the echogenicity.  #Hypernatremia: Due to free water deficit.  Patient is receiving tube feeding.  I will order free water from tube 400 cc every 8 hour.  Discussed with ICU nurse.  Monitor lab.  #Encephalopathy concerning for status epilepticus: Seen by neurology.  EEG with no seizure.  MRI showed advanced cerebral atrophy and ischemic change.    #Ventilator dependent respiratory failure: On antibiotics for pneumonia.  Currently on 40% FiO2 and PEEP 5.  Per PCCM.  #Hypertension: Monitor blood pressure.  Avoid hypotensive episode.  #Aspiration pneumonia: On Vanco and Zosyn.  Discussed with ICU nurse.  Subjective: Seen and examined in ICU.  Good urine output and renal function improving.  On ventilator and not responding. Objective Vital signs in last 24 hours: Vitals:   04/03/19 0749 04/03/19 0800 04/03/19 0820 04/03/19 0925  BP: (!) 164/91  (!) 167/92   Pulse: 96  95 99  Resp: (!) 23  (!) 23 20  Temp:   99.2 F (37.3 C)    TempSrc:  Axillary    SpO2: 100%  100% 100%  Weight:      Height:       Weight change:   Intake/Output Summary (Last 24 hours) at 04/03/2019 1137 Last data filed at 04/03/2019 0600 Gross per 24 hour  Intake 827.54 ml  Output 1800 ml  Net -972.46 ml       Labs: Basic Metabolic Panel: Recent Labs  Lab 04/01/19 0228  04/02/19 0634 04/02/19 1611 04/03/19 0511 04/03/19 0521  NA 138   < > 147*  --  154* 151*  K 3.9   < > 3.5  --  3.7 3.6  CL 102  --  113*  --   --  117*  CO2 21*  --  22  --   --  21*  GLUCOSE 91  --  95  --   --  135*  BUN 49*  --  58*  --   --  65*  CREATININE 6.02*  --  6.39*  --   --  5.92*  CALCIUM 8.2*  --  8.3*  --   --  8.3*  PHOS 4.7*   < > 4.8* 4.6  --  4.6   < > = values in this interval not displayed.   Liver Function Tests: Recent Labs  Lab 03/31/19 2317 04/02/19 0634  AST 63*  --   ALT 81*  --   ALKPHOS 52  --  BILITOT 0.8  --   PROT 6.7  --   ALBUMIN 1.9* 1.9*   No results for input(s): LIPASE, AMYLASE in the last 168 hours. Recent Labs  Lab 04/02/19 2030  AMMONIA 17   CBC: Recent Labs  Lab 03/31/19 2317 04/01/19 0228  04/01/19 2129 04/03/19 0511 04/03/19 0521  WBC 10.9* 10.9*  --   --   --  7.2  HGB 9.4* 9.6*   < > 8.5* 9.9* 9.2*  HCT 27.5* 28.1*   < > 25.0* 29.0* 28.3*  MCV 79.9* 79.8*  --   --   --  83.5  PLT 312 315  --   --   --  316   < > = values in this interval not displayed.   Cardiac Enzymes: No results for input(s): CKTOTAL, CKMB, CKMBINDEX, TROPONINI in the last 168 hours. CBG: Recent Labs  Lab 04/02/19 1612 04/02/19 1907 04/02/19 2306 04/03/19 0312 04/03/19 0809  GLUCAP 127* 121* 126* 118* 171*    Iron Studies: No results for input(s): IRON, TIBC, TRANSFERRIN, FERRITIN in the last 72 hours. Studies/Results: Dg Abd 1 View  Result Date: 04/02/2019 CLINICAL DATA:  MRI clearance. EXAM: ABDOMEN - 1 VIEW COMPARISON:  None. FINDINGS: Enteric tube is partially visualized. There are  no radiopaque foreign bodies that would preclude MRI imaging. High-density bowel contents is consistent with ingested material. No bowel obstruction or free air. IMPRESSION: No radiopaque foreign bodies that would preclude MRI imaging. Electronically Signed   By: Keith Rake M.D.   On: 04/02/2019 01:31   Mr Brain Wo Contrast  Result Date: 04/02/2019 CLINICAL DATA:  Initial evaluation for acute encephalopathy. EXAM: MRI HEAD WITHOUT CONTRAST TECHNIQUE: Multiplanar, multiecho pulse sequences of the brain and surrounding structures were obtained without intravenous contrast. COMPARISON:  Prior CT from 03/31/2019. FINDINGS: Brain: Diffuse prominence of the CSF containing spaces compatible with generalized cerebral atrophy, fairly advanced in nature. Extensive confluent T2/FLAIR hyperintensity throughout the periventricular deep white matter both cerebral hemispheres as well as the pons, consistent with advanced chronic microvascular ischemic disease. Multiple superimposed remote lacunar infarcts seen involving the hemispheric cerebral white matter as well as the thalami and pons. No abnormal foci of restricted diffusion to suggest acute or subacute ischemia. Gray-white matter differentiation maintained without evidence for chronic cortical infarction. No acute intracranial hemorrhage. Multiple scattered chronic micro hemorrhages noted clustered about the periventricular white matter and pons, most likely related to chronic underlying hypertension. No mass lesion, mass effect, or midline shift. Diffuse ventricular prominence related to global parenchymal volume loss without hydrocephalus. No extra-axial fluid collection. Pituitary gland and suprasellar region normal. Midline structures intact. Vascular: Major intracranial vascular flow voids are maintained. Skull and upper cervical spine: Craniocervical junction within normal limits. Upper cervical spine normal. Bone marrow signal intensity within normal limits.  No scalp soft tissue abnormality. Sinuses/Orbits: Globes and orbital soft tissues within normal limits. Scattered mucosal thickening noted throughout the paranasal sinuses. Superimposed air-fluid levels noted within the right maxillary and sphenoid sinuses. Fluid seen layering within the posterior nasopharynx. Patient likely intubated. Small bilateral mastoid effusions also likely related to intubation as well. Other: None. IMPRESSION: 1. No acute intracranial abnormality. 2. Advanced cerebral atrophy with chronic microvascular ischemic disease, with multiple remote lacunar infarcts involving the hemispheric cerebral white matter, thalami, and pons. 3. Multiple scattered chronic micro hemorrhages clustered about the periventricular white matter and pons, most likely related to underlying poorly controlled hypertension. Electronically Signed   By: Jeannine Boga M.D.   On:  04/02/2019 02:52   Dg Chest Port 1 View  Result Date: 04/03/2019 CLINICAL DATA:  Patient admitted for management of acute kidney injury and possible status epilepticus. EXAM: PORTABLE CHEST 1 VIEW COMPARISON:  Single-view of the chest 04/01/2019 12/28/2010. FINDINGS: Support tubes and lines are unchanged and project in good position. Hazy bilateral pulmonary opacities seen on yesterday's examination persist without marked change. Heart size is normal. No pneumothorax or pleural effusion. IMPRESSION: No change in left worse than right patchy airspace disease worrisome for pneumonia. Support tubes and lines projecting good position. Electronically Signed   By: Inge Rise M.D.   On: 04/03/2019 09:08    Medications: Infusions: . sodium chloride Stopped (04/02/19 0029)  . sodium chloride 10 mL/hr at 04/03/19 0600  . feeding supplement (VITAL 1.5 CAL) 50 mL/hr at 04/02/19 1700  . lacosamide (VIMPAT) IV 100 mg (04/03/19 1052)  . levETIRAcetam Stopped (04/02/19 2233)    Scheduled Medications: . [START ON 04/04/2019] amLODipine  10  mg Per Tube Daily  . chlorhexidine gluconate (MEDLINE KIT)  15 mL Mouth Rinse BID  . Chlorhexidine Gluconate Cloth  6 each Topical Daily  . feeding supplement (PRO-STAT SUGAR FREE 64)  30 mL Per Tube TID  . heparin  5,000 Units Subcutaneous Q8H  . insulin aspart  0-9 Units Subcutaneous Q4H  . mouth rinse  15 mL Mouth Rinse 10 times per day  . mupirocin ointment  1 application Nasal BID  . pantoprazole sodium  40 mg Per Tube Daily  . vancomycin variable dose per unstable renal function (pharmacist dosing)   Does not apply See admin instructions    have reviewed scheduled and prn medications.  Physical Exam: General: Intubated, not responding Heart:RRR, s1s2 nl, no rubs Lungs: Coarse breath sound bilateral, no wheezing Abdomen:soft, Non-tender, non-distended Extremities:No LE edema Dialysis Access: Right chest dialysis catheter, placed at [place] 04/03/2019,11:37 AM  LOS: 3 days  Pager: [phone]

## 2019-04-03 NOTE — Progress Notes (Signed)
eLink Physician-Brief Progress Note Patient Name: Chad Mcclure DOB: Mar 07, 1956 MRN: [id number]   Date of Service  04/03/2019  HPI/Events of Note  Notified of hypertension. Pt with AKI from contrast nephropathy and started on hemodialysis.  The patient is on ACE, amlodipine and hydralazine at home which have been held.  Pt has been needing IV labetalol almost every 2 hours.   eICU Interventions  Restart amlodipine 10mg .  Continue labetalol prn.  If pt continues to be hypertensive, would start hydralazine.     Intervention Category Intermediate Interventions: Hypertension - evaluation and management  Elsie Lincoln 04/03/2019, 2:35 AM

## 2019-04-03 NOTE — Progress Notes (Signed)
CSW contacted patient's daughter regarding SNF eligibility for out-of-state SNF. CSW discussed with her patient would be ineligible for [facility] placement with out-of-state Medicaid and provided education and resources on ways to work towards transitioning him to [facility]. CSW noted that family understood once he is medically cleared he will likely need to return to [facility] or find new SNF in [place].  Lamonte Richer, LCSW, [place] Worker II [phone]

## 2019-04-03 NOTE — Progress Notes (Signed)
RT obtained scheduled ABG on pt with the following results and no changes at this time. RT will continue to monitor.  Results for Chad Mcclure, Chad Mcclure (MRN [id number]) as of 04/03/2019 06:06  Ref. Range 04/03/2019 05:11  Sample type Unknown ARTERIAL  pH, Arterial Latest Ref Range: 7.350 - 7.450  7.447  pCO2 arterial Latest Ref Range: 32.0 - 48.0 mmHg 29.7 (L)  pO2, Arterial Latest Ref Range: 83.0 - 108.0 mmHg 71.0 (L)  TCO2 Latest Ref Range: 22 - 32 mmol/L 21 (L)  Acid-base deficit Latest Ref Range: 0.0 - 2.0 mmol/L 3.0 (H)  Bicarbonate Latest Ref Range: 20.0 - 28.0 mmol/L 20.3  O2 Saturation Latest Units: % 94.0  Patient temperature Unknown 100.5 F  Collection site Unknown RADIAL, ALLEN'S TEST ACCEPTABLE

## 2019-04-03 NOTE — Progress Notes (Addendum)
Upon entering room, OGT noticeably longer, meausred at 85 cm outside of mouth rather than 65 previously measured. TF off. Reinserted OGT to 61 cm and ordered xray to confirm placement

## 2019-04-03 NOTE — Progress Notes (Signed)
Pharmacy Antibiotic Note  Chad Mcclure is a 63 y.o. male admitted on 03/31/2019 with pneumonia.  [name] cultures are growing moderate staph aureus - the sensitivities should return tomorrow. Pt is AKI and received HD - TuThSat at outside hospital.  Patient showing some signs of renal recovery, need for dialysis being assessed daily.  Random vanc level today 20 mg/dl - per nephrology no dialysis today.  Plan: Changed Vanc to variable dosing based on levels. Will continue to monitor renal function and dialysis need and draw vanc levels as appropriate.   Height: 6' (182.9 cm) Weight: 194 lb 10.7 oz (88.3 kg) IBW/kg (Calculated) : 77.6  Temp (24hrs), Avg:100.1 F (37.8 C), Min:98.9 F (37.2 C), Max:100.7 F (38.2 C)  Recent Labs  Lab 03/31/19 2317 04/01/19 0228 04/02/19 0634 04/03/19 0521 04/03/19 0928  WBC 10.9* 10.9*  --  7.2  --   CREATININE 5.96* 6.02* 6.39* 5.92*  --   VANCORANDOM  --   --   --   --  20    Estimated Creatinine Clearance: 14 mL/min (A) (by C-G formula based on SCr of 5.92 mg/dL (H)).    Per [place], PharmD, [facility] Clinical Pharmacist Please see AMION for all Pharmacists' Contact Phone Numbers 04/03/2019, 12:45 PM

## 2019-04-03 NOTE — Progress Notes (Signed)
CSW notified by RN that patient's older son Chad Mcclure had questions/concerns regarding discharge and placement. CSW attempted to call and noted phone line was busy and will attempt to call again.  Lamonte Richer, LCSW, [place] Worker II [phone]

## 2019-04-03 NOTE — Progress Notes (Signed)
Pt transported by RN, RT and Transport from 4N15 to MRI and back without any complications.

## 2019-04-03 NOTE — Progress Notes (Signed)
NAME:  Chad Mcclure, MRN:  [id number], DOB:  1956-01-05, LOS: 3 ADMISSION DATE:  03/31/2019, CONSULTATION DATE:  03/31/19 [facility]  [facility]  CHIEF COMPLAINT:  Status epilepticus   Brief History   Chad Mcclure is a [age] y.o. male who was initially admitted to [name] 8/20 with fevers felt to be due to PNA and cystitis. Required intubation 8/21 for hypoxic respiratory failure.  Extubated 8/29 then required re-intubation 8/31 for status epilepticus. [facility].  History of present illness   Pt is encephelopathic; therefore, this HPI is obtained from chart review. Chad Mcclure is a 63 y.o. male who has a PMH including but not limited to HTN, HLD, HCV, Left CVA, DM, schizophrenia, dementia and [facility] [place] (see "past medical history" for rest).  He [facility] 03[facility]18/19 with fever.  He was started on vanc / zosyn for suspected PNA and cystitis.  See below for a list of events from the hospitalization: 8/21, rapid response was activated for hypoxic respiratory failure and pt was transferred to the ICU where he required intubation.   Was on propofol initially which was later switched to fentanyl due to hypertriglyceridemia. 8/24, he required initiation of hemodialysis after failing a lasix challenge, TTS schedule planned (R subclavian perm cath placed). 8/26, he had a witnessed aspiration event, zosyn continued.   8/29 he passed SBT and was extubated. 8/31 had seizures and status epilepticus requiring re-intubation.  Treated with ativan, vimpat, keppra. 9/1 EEG negative for seizures. 9/2 propofol discontinued but pt remained encephalopathic / unresponsive.  [facility] evaluation and management.    Past Medical History  HTN, HLD, HCV, Left CVA, DM, schizophrenia, dementia.  Significant Hospital Events   8/20 > admit. 8/21 > intubation. 8/24 > HD started. 8/29 > extubated. 8/31 > seizures /  status. 9/2 > [facility] 9/4> Tolerated PS. Unable to extubate d/t mental status  Consults:  Neurology. Nephrology.  Procedures:  ETT 8/21 > 8/29.  8/31 >  R [place] HD 8/24 >  L IJ CVL 8/22 >   Significant Diagnostic Tests:  EEG 9/1 > neg. CT head 9/2 >  EEG 9/3 > neg for sz MRI 9/3 > chronic microvascular changes, remote lacunar infarcts  Micro Data:  SARS CoV2 8/20 > neg. Trach Asp 9/3 > Abundant S.aureus  Antimicrobials:  Zosyn 9/2 >   [place] (from [place]) >   Interim history/subjective:  Tmax 100.7. On minimal vent settings. Tolerated PS yesterday and on on 10/5 this morning. Making urine  Objective:  Blood pressure (!) 168/80, pulse 98, temperature (!) 100.5 F (38.1 C), temperature source Axillary, resp. rate (!) 22, height 6' (1.829 m), weight 88.3 kg, SpO2 99 %.    Vent Mode: PRVC FiO2 (%):  [40 %] 40 % Set Rate:  [18 bmp] 18 bmp Vt Set:  [620 mL] 620 mL PEEP:  [5 cmH20] 5 cmH20 Pressure Support:  [5 cmH20] 5 cmH20 Plateau Pressure:  [11 cmH20-20 cmH20] 15 cmH20   Intake/Output Summary (Last 24 hours) at 04/03/2019 0717 Last data filed at 04/03/2019 0600 Gross per 24 hour  Intake 1104.85 ml  Output 2200 ml  Net -1095.15 ml   Filed Weights   04/01/19 0500 04/02/19 0500  Weight: 92.5 kg 88.3 kg   Physical Exam: General: Chronically ill-appearing, no acute distress HENT: [place], AT, ETT in place Eyes: Right gaze preference, R pupil 4mm sluggish and L  pupil 80mm with brisk, no scleral icterus Respiratory: Mild anterior rhonchi Cardiovascular: RRR, -M/R/G, no JVD GI: BS+, soft, nontender Extremities:-Edema,-tenderness Neuro: Left hemiparesis, non-purposeful movement of RUE and RLE GU: Foley in place  CXR 9/5 - Interstitial thickening, improved compared to 9/3. No effusion or edema  Assessment & Plan:   Acute hypoxic respiratory failure. - PS however no plan for extubation due to mental status - Bronchial hygiene - Titrate O2 for sat of 88-92 -  VAP  S.aureus PNA - Continue Vanc - Discontinue meropenem - Pharmacy to f/u on [place] cultures for sensitivities  Encephalopathy: seizures, sedating meds, ICU delirium Seizures with concern for status. - Appreciate Neurology recs - Continue vimpat, keppra, ativan PRN.  AKI secondary contrast nephropathy - started on HD on 8/24 (TTS schedule). Potential renal recovery - Appreciate Nephrology input. Holding dialysis  - Monitor UOP  Hypertension - Amlodipine - PRN BB and hydralazine  Protein calorie malnutrition. - Tube feeds per nutrition.  Hx DM. - SSI.  Best Practice:  Diet: Tube feeds. Pain/Anxiety/Delirium protocol (if indicated): Fentanyl PRN / Midazolam PRN.  RASS goal 0. VAP protocol (if indicated): In place. DVT prophylaxis: SCD's / Heparin. GI prophylaxis: PPI. Glucose control: SSI. Mobility: Bedrest. Code Status: Full. Family Communication: Updated daughter Maxine Glenn via phone on 9/5 Disposition: ICU  Labs   CBC: Recent Labs  Lab 03/31/19 2317 04/01/19 0228 04/01/19 0449 04/01/19 2129 04/03/19 0511 04/03/19 0521  WBC 10.9* 10.9*  --   --   --  7.2  HGB 9.4* 9.6* 11.2* 8.5* 9.9* 9.2*  HCT 27.5* 28.1* 33.0* 25.0* 29.0* 28.3*  MCV 79.9* 79.8*  --   --   --  83.5  PLT 312 315  --   --   --  316   Basic Metabolic Panel: Recent Labs  Lab 03/31/19 2317  04/01/19 0228 04/01/19 0449 04/01/19 1135 04/01/19 2129 04/01/19 2316 04/02/19 0634 04/02/19 1611 04/03/19 0511 04/03/19 0521  NA 139  --  138 141  --  146*  --  147*  --  154* 151*  K 3.7  --  3.9 3.8  --  3.5  --  3.5  --  3.7 3.6  CL 103  --  102  --   --   --   --  113*  --   --  117*  CO2 22  --  21*  --   --   --   --  22  --   --  21*  GLUCOSE 91  --  91  --   --   --   --  95  --   --  135*  BUN 47*  --  49*  --   --   --   --  58*  --   --  65*  CREATININE 5.96*  --  6.02*  --   --   --   --  6.39*  --   --  5.92*  CALCIUM 8.4*  --  8.2*  --   --   --   --  8.3*  --   --  8.3*  MG  --     < > 2.2  --  2.1  --  2.3 2.3 2.3  --  2.1  PHOS  --    < > 4.7*  --  4.4  --  3.9 4.8* 4.6  --  4.6   < > = values in this interval not displayed.   GFR:  Estimated Creatinine Clearance: 14 mL/min (A) (by C-G formula based on SCr of 5.92 mg/dL (H)). Recent Labs  Lab 03/31/19 2317 04/01/19 0228 04/03/19 0521  WBC 10.9* 10.9* 7.2   Liver Function Tests: Recent Labs  Lab 03/31/19 2317 04/02/19 0634  AST 63*  --   ALT 81*  --   ALKPHOS 52  --   BILITOT 0.8  --   PROT 6.7  --   ALBUMIN 1.9* 1.9*   No results for input(s): LIPASE, AMYLASE in the last 168 hours. Recent Labs  Lab 04/02/19 2030  AMMONIA 17   ABG    Component Value Date/Time   PHART 7.447 04/03/2019 0511   PCO2ART 29.7 (L) 04/03/2019 0511   PO2ART 71.0 (L) 04/03/2019 0511   HCO3 20.3 04/03/2019 0511   TCO2 21 (L) 04/03/2019 0511   ACIDBASEDEF 3.0 (H) 04/03/2019 0511   O2SAT 94.0 04/03/2019 0511    Coagulation Profile: No results for input(s): INR, PROTIME in the last 168 hours. Cardiac Enzymes: No results for input(s): CKTOTAL, CKMB, CKMBINDEX, TROPONINI in the last 168 hours. HbA1C: Hgb A1c MFr Bld  Date/Time Value Ref Range Status  03/31/2019 11:30 PM 7.0 (H) 4.8 - 5.6 % Final    Comment:    (NOTE) Pre diabetes:          5.7%-6.4% Diabetes:              >6.4% Glycemic control for   <7.0% adults with diabetes   12/16/2010 04:00 AM  <5.7 % Final   5.6 (NOTE)                                                                       According to the ADA Clinical Practice Recommendations for 2011, when HbA1c is used as a screening test:   >=6.5%   Diagnostic of Diabetes Mellitus           (if abnormal result  is confirmed)  5.7-6.4%   Increased risk of developing Diabetes Mellitus  References:Diagnosis and Classification of Diabetes Mellitus,Diabetes LPFX,9024,09(BDZHG 1):S62-S69 and Standards of Medical Care in         Diabetes - 2011,Diabetes Care,2011,34  (Suppl 1):S11-S61.   CBG: Recent Labs   Lab 04/02/19 1232 04/02/19 1612 04/02/19 1907 04/02/19 2306 04/03/19 0312  GLUCAP 115* 127* 121* 126* 118*   The patient is critically ill with multiple organ systems failure and requires high complexity decision making for assessment and support, frequent evaluation and titration of therapies, application of advanced monitoring technologies and extensive interpretation of multiple databases.   Critical Care Time devoted to patient care services described in this note is 38 Minutes. This time reflects time of care of this signee Dr. Rodman Pickle. This critical care time does not reflect procedure time, or teaching time or supervisory time of PA/NP/Med student/Med Resident etc but could involve care discussion time.  Rodman Pickle, M.D. [facility] Pulmonary/Critical Care Medicine 04/03/2019 7:17 AM  Pager: [phone] After hours pager: [phone]

## 2019-04-04 ENCOUNTER — Inpatient Hospital Stay (HOSPITAL_COMMUNITY): Payer: Medicaid Other

## 2019-04-04 LAB — POCT I-STAT 7, (LYTES, BLD GAS, ICA,H+H)
Acid-base deficit: 1 mmol/L (ref 0.0–2.0)
Bicarbonate: 23.3 mmol/L (ref 20.0–28.0)
Calcium, Ion: 1.21 mmol/L (ref 1.15–1.40)
HCT: 27 % — ABNORMAL LOW (ref 39.0–52.0)
Hemoglobin: 9.2 g/dL — ABNORMAL LOW (ref 13.0–17.0)
O2 Saturation: 95 %
Patient temperature: 98.6
Potassium: 3.9 mmol/L (ref 3.5–5.1)
Sodium: 158 mmol/L — ABNORMAL HIGH (ref 135–145)
TCO2: 24 mmol/L (ref 22–32)
pCO2 arterial: 35.7 mmHg (ref 32.0–48.0)
pH, Arterial: 7.424 (ref 7.350–7.450)
pO2, Arterial: 74 mmHg — ABNORMAL LOW (ref 83.0–108.0)

## 2019-04-04 LAB — GLUCOSE, CAPILLARY
Glucose-Capillary: 167 mg/dL — ABNORMAL HIGH (ref 70–99)
Glucose-Capillary: 95 mg/dL (ref 70–99)
Glucose-Capillary: 126 mg/dL — ABNORMAL HIGH (ref 70–99)
Glucose-Capillary: 133 mg/dL — ABNORMAL HIGH (ref 70–99)
Glucose-Capillary: 166 mg/dL — ABNORMAL HIGH (ref 70–99)

## 2019-04-04 LAB — RENAL FUNCTION PANEL
Albumin: 1.9 g/dL — ABNORMAL LOW (ref 3.5–5.0)
Anion gap: 9 (ref 5–15)
BUN: 64 mg/dL — ABNORMAL HIGH (ref 8–23)
CO2: 24 mmol/L (ref 22–32)
Calcium: 8.3 mg/dL — ABNORMAL LOW (ref 8.9–10.3)
Chloride: 122 mmol/L — ABNORMAL HIGH (ref 98–111)
Creatinine, Ser: 5.17 mg/dL — ABNORMAL HIGH (ref 0.61–1.24)
GFR calc Af Amer: 13 mL/min — ABNORMAL LOW (ref >60)
GFR calc non Af Amer: 11 mL/min — ABNORMAL LOW (ref >60)
Glucose, Bld: 130 mg/dL — ABNORMAL HIGH (ref 70–99)
Phosphorus: 4.7 mg/dL — ABNORMAL HIGH (ref 2.5–4.6)
Potassium: 3.8 mmol/L (ref 3.5–5.1)
Sodium: 155 mmol/L — ABNORMAL HIGH (ref 135–145)

## 2019-04-04 LAB — VANCOMYCIN, RANDOM: Vancomycin Rm: 16

## 2019-04-04 MED ORDER — RACEPINEPHRINE HCL 2.25 % IN NEBU: 0.5000 mL | INHALATION_SOLUTION | RESPIRATORY_TRACT | Status: DC | PRN

## 2019-04-04 MED ORDER — ACETAMINOPHEN 160 MG/5ML PO SOLN
500.0000 mg | Freq: Four times a day (QID) | ORAL | Status: DC | PRN
  Filled 2019-04-04: qty 20.3

## 2019-04-04 MED ORDER — FREE WATER: 500.0000 mL | Freq: Four times a day (QID) | Status: DC

## 2019-04-04 MED ORDER — FREE WATER
400.0000 mL | Freq: Three times a day (TID) | Status: DC
  Administered 2019-04-04 – 2019-04-06 (×6): 400 mL

## 2019-04-04 MED ORDER — DEXTROSE 5 % IV SOLN
INTRAVENOUS | Status: DC
  Administered 2019-04-04: 09:00:00 via INTRAVENOUS

## 2019-04-04 MED ORDER — ACETAMINOPHEN 160 MG/5ML PO SOLN
500.0000 mg | Freq: Four times a day (QID) | ORAL | Status: DC | PRN
  Administered 2019-04-04 – 2019-04-18 (×6): 500 mg
  Filled 2019-04-04 (×6): qty 20.3

## 2019-04-04 MED ORDER — VANCOMYCIN HCL 10 G IV SOLR
1500.0000 mg | Freq: Once | INTRAVENOUS | Status: AC
  Administered 2019-04-04: 1500 mg via INTRAVENOUS
  Filled 2019-04-04: qty 1500

## 2019-04-04 NOTE — Progress Notes (Signed)
Subjective: Continues to improve, self extubated last night.  Exam: Vitals:   04/04/19 1900 04/04/19 2000  BP: 134/79 (!) 144/81  Pulse: 86 92  Resp: (!) 21 (!) 23  Temp:  99.1 F (37.3 C)  SpO2: 99% 99%   Gen: In bed, NAD Resp: non-labored breathing, no acute distress Abd: soft, nt  Neuro: MS: He appears to wiggle toes to command, protrude tongue, when I ask him if he goes by Celedonio Savage, he appears to say "Deundre" but I am not certain of this as he is very dysarthric and low spoken CN: Right pupil is slightly larger and sluggish, left pupil is brisk, right eye is slightly down and outwardly deviated. Motor: He has a chronic left hemiparesis, moves right side purposefully. Sensory: Responds to nox stimulation x4 DTR: Slightly brisker on the left than right  Pertinent Labs: Ammonia and TSH are normal  MRI reviewed-he has significant chronic vascular disease without acute findings  Impression: 63 year old male with persistent encephalopathy status post seizures and respiratory failure due to pneumonia at outside hospital.  Possibilities include continued slow metabolization of sedating medications due to renal failure, ICU delirium, or sequela of multiple frequent seizures.  The fact that he does appear to be improving day by day is reassuring.  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  I did perform an MRI to rule out P-comm aneurysm which was negative.  There have been no seizure seen since arrival either clinically or electrographically.  Of note, I had a conversation on 9/4 with his daughter who is not aware of a diagnosis of schizophrenia or dementia.  He does have some cognitive slowing due to his previous stroke, however.  Recommendations: 1) continue Keppra 500 twice daily 2) continue lacosamide 100 twice daily 3) we will follow  Roland Rack, MD [facility] [phone]  If 7pm- 7am, please page neurology  on call as listed in [place].

## 2019-04-04 NOTE — Progress Notes (Signed)
[facility] NEPHROLOGY PROGRESS NOTE  Assessment/ Plan: Pt is a 63 y.o. yo male with HTN, HLD, HCV, h/o L CVA, DM schizophrenia, dementia, SNF resident who is transferred to [facility] for concern for status epilepticus, consulted for management of AKI. He presented from SNF in [place] to ED 8/20 with fevers.  Admitted for PNA and UTI - treated with vanc, zosyn.  Developed VDRF, AKI requiring RRT - HD started 8/24 via [facility] (was being dialyzed on TTS schedule there).  #Acute kidney injury, nonoliguric: Severe AKI at OSH thought to be due to contrast nephropathy therefore started dialysis.  Per chart the ANA, anti-GBM and ANCA were negative.  He has good urine output, around 2000 cc in 24 hours and serum creatinine level trending down to 5.17 today.  He does not look volume overload and potassium level acceptable.  We will continue to hold dialysis today with daily assessment for dialysis need.  UA with some protein but no RBC or WBC.  Kidney ultrasound with increased echogenicity and two simple kidney cyst.  #Hypernatremia: Due to free water deficit.  Patient is receiving tube feeding.  He just got 400 cc of free water yesterday and then the feeding tube fell off.  Discussed with ICU nurse today.  Continue free water 400 cc q8 hr, added D5W by PCCM.  Monitor lab.    #Encephalopathy concerning for status epilepticus: Seen by neurology.  EEG with no seizure.  MRI showed advanced cerebral atrophy and ischemic change.    #Ventilator dependent respiratory failure: On antibiotics for pneumonia.  He was self extubated last night, on 3 L oxygen.  Per PCCM.  #Hypertension: BP elevated, started amlodipine.  Monitor blood pressure.  Avoid hypotensive episode.  #Aspiration pneumonia: On Vanco and Zosyn.  Discussed with ICU nurse.  Subjective: Seen and examined in ICU.  Good urine output.  Overnight patient self extubated and feeding tube fell off.  Now on oxygen.  Is alert but not following  commands.  Objective Vital signs in last 24 hours: Vitals:   04/04/19 0838 04/04/19 0921 04/04/19 0954 04/04/19 1000  BP: (!) 146/87 (!) 176/95 (!) 150/102 (!) 168/91  Pulse: 89  97 96  Resp: (!) 22  (!) 26 (!) 23  Temp:      TempSrc:      SpO2: 97%  95% 96%  Weight:      Height:       Weight change:   Intake/Output Summary (Last 24 hours) at 04/04/2019 1024 Last data filed at 04/04/2019 0700 Gross per 24 hour  Intake 1688.28 ml  Output 1970 ml  Net -281.72 ml       Labs: Basic Metabolic Panel: Recent Labs  Lab 04/02/19 0634 04/02/19 1611  04/03/19 0521 04/04/19 0435 04/04/19 0452  NA 147*  --    < > 151* 158* 155*  K 3.5  --    < > 3.6 3.9 3.8  CL 113*  --   --  117*  --  122*  CO2 22  --   --  21*  --  24  GLUCOSE 95  --   --  135*  --  130*  BUN 58*  --   --  65*  --  64*  CREATININE 6.39*  --   --  5.92*  --  5.17*  CALCIUM 8.3*  --   --  8.3*  --  8.3*  PHOS 4.8* 4.6  --  4.6  --  4.7*   < > =  values in this interval not displayed.   Liver Function Tests: Recent Labs  Lab 03/31/19 2317 04/02/19 0634 04/04/19 0452  AST 63*  --   --   ALT 81*  --   --   ALKPHOS 52  --   --   BILITOT 0.8  --   --   PROT 6.7  --   --   ALBUMIN 1.9* 1.9* 1.9*   No results for input(s): LIPASE, AMYLASE in the last 168 hours. Recent Labs  Lab 04/02/19 2030  AMMONIA 17   CBC: Recent Labs  Lab 03/31/19 2317 04/01/19 0228  04/03/19 0511 04/03/19 0521 04/04/19 0435  WBC 10.9* 10.9*  --   --  7.2  --   HGB 9.4* 9.6*   < > 9.9* 9.2* 9.2*  HCT 27.5* 28.1*   < > 29.0* 28.3* 27.0*  MCV 79.9* 79.8*  --   --  83.5  --   PLT 312 315  --   --  316  --    < > = values in this interval not displayed.   Cardiac Enzymes: No results for input(s): CKTOTAL, CKMB, CKMBINDEX, TROPONINI in the last 168 hours. CBG: Recent Labs  Lab 04/03/19 1642 04/03/19 1917 04/03/19 2300 04/04/19 0319 04/04/19 0806  GLUCAP 126* 132* 156* 167* 95    Iron Studies: No results for  input(s): IRON, TIBC, TRANSFERRIN, FERRITIN in the last 72 hours. 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 EXAM: MRA HEAD WITHOUT CONTRAST TECHNIQUE: Angiographic images of the Circle of Willis were obtained using MRA technique without intravenous contrast. COMPARISON:  MR head without contrast 04/02/2019. FINDINGS: The internal carotid arteries are within normal limits from the high cervical segments through the ICA termini bilaterally. The A1 and M1 segments are normal. MCA bifurcations are intact. The ACA and MCA branch vessels are within normal limits. The left vertebral artery is dominant. Study is mildly degraded by patient motion. PICA origins are not clearly seen. Left PICA is patent. Basilar artery is within normal limits. Both posterior cerebral arteries originate from basilar tip. PCA branch vessels are within normal limits. IMPRESSION: Normal MRA circle-of-Willis without significant proximal stenosis, aneurysm, or branch vessel occlusion. No focal lesion to explain third nerve palsy. Electronically Signed   By: San Morelle M.D.   On: 04/03/2019 18:31   US Renal  Result Date: 04/03/2019 CLINICAL DATA:  Acute renal insufficiency. EXAM: RENAL / URINARY TRACT ULTRASOUND COMPLETE COMPARISON:  None. FINDINGS: Right Kidney: Renal measurements: 12.3 x 6.5 x 5.2 cm = volume: 215 mL. Increased cortical echogenicity. Benign-appearing cyst is noted in the upper pole of the right kidney measuring 2.6 x 2.5 x 2.6 cm. A second smaller cyst is seen adjacent to the superior right renal pelvis measuring 1.5 x 1.2 x 1.1 cm. Left Kidney: Renal measurements: 12.9 x 6.5 x 5.3 cm = volume: 233 mL. Increased parenchymal echogenicity. Bladder: Appears normal for degree of bladder distention. IMPRESSION: Increased parenchymal echogenicity of the kidneys usually associated with intrinsic renal disease.  Two benign-appearing right renal cysts. Electronically Signed   By: Fidela Salisbury M.D.   On: 04/03/2019 13:24   Dg Chest Port 1 View  Result Date: 04/03/2019 CLINICAL DATA:  Patient admitted for management of acute kidney injury and possible status epilepticus. EXAM: PORTABLE CHEST 1 VIEW COMPARISON:  Single-view of the chest 04/01/2019 12/28/2010. FINDINGS: Support tubes and lines are  unchanged and project in good position. Hazy bilateral pulmonary opacities seen on yesterday's examination persist without marked change. Heart size is normal. No pneumothorax or pleural effusion. IMPRESSION: No change in left worse than right patchy airspace disease worrisome for pneumonia. Support tubes and lines projecting good position. Electronically Signed   By: Inge Rise M.D.   On: 04/03/2019 09:08   Dg Abd Portable 1v  Result Date: 04/03/2019 CLINICAL DATA:  Status post OG tube placement. EXAM: PORTABLE ABDOMEN - 1 VIEW COMPARISON:  Single-view of the abdomen 04/02/2019. FINDINGS: OG tube is in place with the tip and side-port in the stomach. Bowel gas pattern is unremarkable. IMPRESSION: OG tube in good position. Electronically Signed   By: Inge Rise M.D.   On: 04/03/2019 12:52    Medications: Infusions: . sodium chloride Stopped (04/02/19 0029)  . sodium chloride 10 mL/hr at 04/04/19 0700  . dextrose 75 mL/hr at 04/04/19 0832  . feeding supplement (VITAL 1.5 CAL) Stopped (04/04/19 0355)  . lacosamide (VIMPAT) IV Stopped (04/03/19 2237)  . levETIRAcetam Stopped (04/04/19 0418)  . vancomycin 1,500 mg (04/04/19 0925)    Scheduled Medications: . amLODipine  10 mg Per Tube Daily  . chlorhexidine gluconate (MEDLINE KIT)  15 mL Mouth Rinse BID  . Chlorhexidine Gluconate Cloth  6 each Topical Daily  . feeding supplement (PRO-STAT SUGAR FREE 64)  30 mL Per Tube TID  . free water  500 mL Per Tube Q6H  . heparin  5,000 Units Subcutaneous Q8H  . insulin aspart  0-9 Units Subcutaneous Q4H   . mouth rinse  15 mL Mouth Rinse 10 times per day  . mupirocin ointment  1 application Nasal BID  . pantoprazole sodium  40 mg Per Tube Daily  . vancomycin variable dose per unstable renal function (pharmacist dosing)   Does not apply See admin instructions    have reviewed scheduled and prn medications.  Physical Exam: General: Extubated, alert but not following commands, not in distress Heart:RRR, s1s2 nl, no rubs Lungs: Bibasal decreased breath sound no wheezing Abdomen:soft, Non-tender, non-distended Extremities:No LE edema Dialysis Access: Right chest dialysis catheter, placed at [place] 04/04/2019,10:24 AM  LOS: 4 days  Pager: [phone]

## 2019-04-04 NOTE — Procedures (Signed)
Extubation Procedure Note  Patient Details:   Name: Chad Mcclure DOB: [birth year] MRN: [id number]   Airway Documentation:  Airway 8 mm (Active)  Secured at (cm) 24 cm 04/04/19 0340  Measured From Lips 04/04/19 [place] 04/04/19 0340  Secured By [employer] 04/04/19 0340  Tube Holder Repositioned Yes 04/04/19 0340  Cuff Pressure (cm H2O) 26 cm H2O 04/04/19 0030  Site Condition Dry 04/04/19 0340   Vent end date: 04/04/19 Vent end time: 0400   Evaluation  O2 sats: stable throughout Complications: No apparent complications Patient did tolerate procedure well. Bilateral Breath Sounds: Diminished  Patient self extubated himself. Placed patient on high flow cannula at 10lpm. Dr.Jong at bedside, will do arterial gas at 0430.  No  [name] 04/04/2019, 4:10 AM

## 2019-04-04 NOTE — Progress Notes (Addendum)
NAME:  Chad Mcclure, MRN:  [id number], DOB:  15-Aug-1955, LOS: 4 ADMISSION DATE:  03/31/2019, CONSULTATION DATE:  03/31/19 REFERRING MD:  [facility]  CHIEF COMPLAINT:  Status epilepticus   Brief History   Chad Mcclure is a 63 y.o. male who was initially admitted to [name] 8/20 with fevers felt to be due to PNA and cystitis. Required intubation 8/21 for hypoxic respiratory failure.  Extubated 8/29 then required re-intubation 8/31 for status epilepticus. Transferred to [facility] 9/2.  History of present illness   Pt is encephelopathic; therefore, this HPI is obtained from chart review. Chad Mcclure is a 63 y.o. male who has a PMH including but not limited to HTN, HLD, HCV, Left CVA, DM, schizophrenia, dementia and who resides at [facility] in [place] (see "past medical history" for rest).  He was admitted to [facility] on 03/18/19 with fever.  He was started on vanc / zosyn for suspected PNA and cystitis.  See below for a list of events from the hospitalization: 8/21, rapid response was activated for hypoxic respiratory failure and pt was transferred to the ICU where he required intubation.   Was on propofol initially which was later switched to fentanyl due to hypertriglyceridemia. 8/24, he required initiation of hemodialysis after failing a lasix challenge, TTS schedule planned (R subclavian perm cath placed). 8/26, he had a witnessed aspiration event, zosyn continued.   8/29 he passed SBT and was extubated. 8/31 had seizures and status epilepticus requiring re-intubation.  Treated with ativan, vimpat, keppra. 9/1 EEG negative for seizures. 9/2 propofol discontinued but pt remained encephalopathic / unresponsive.  Later transferred to [facility] for further evaluation and management.    Past Medical History  HTN, HLD, HCV, Left CVA, DM, schizophrenia, dementia.  Significant Hospital Events   8/20 > admit. 8/21 > intubation. 8/24 > HD started. 8/29 > extubated. 8/31 > seizures /  status. 9/2 > transferred to [facility]. 9/4> Tolerated PS. Unable to extubate d/t mental status 9/5 > Self - extubated. Stable. Remains on [place]  Consults:  Neurology. Nephrology.  Procedures:  ETT 8/21 > 8/29.  8/31 >  R [place] HD 8/24 >  L IJ CVL 8/22 >   Significant Diagnostic Tests:  EEG 9/1 > neg. CT head 9/2 >  EEG 9/3 > neg for sz MRI 9/3 > chronic microvascular changes, remote lacunar infarcts  Micro Data:  SARS CoV2 8/20 > neg. Trach Asp 9/3 > Abundant S.aureus  Antimicrobials:  Zosyn 9/2 >   Sputum (from [place]) >   Interim history/subjective:  Self-extubated overnight. On 5L supplemental oxygen and clinically stable. Awake. Not interactive. Afebrile last 24 hours  Objective:  Blood pressure (!) 179/107, pulse 100, temperature 99.8 F (37.7 C), temperature source Axillary, resp. rate (!) 21, height 6' (1.829 m), weight 85.3 kg, SpO2 98 %.    Vent Mode: PRVC FiO2 (%):  [40 %] 40 % Set Rate:  [15 bmp-18 bmp] 18 bmp Vt Set:  [620 mL] 620 mL PEEP:  [5 cmH20] 5 cmH20 Pressure Support:  [10 cmH20] 10 cmH20 Plateau Pressure:  [18 cmH20-22 cmH20] 22 cmH20   Intake/Output Summary (Last 24 hours) at 04/04/2019 0751 Last data filed at 04/04/2019 0700 Gross per 24 hour  Intake 1838.28 ml  Output 1970 ml  Net -131.72 ml   Filed Weights   04/01/19 0500 04/02/19 0500 04/04/19 0500  Weight: 92.5 kg 88.3 kg 85.3 kg   Physical Exam: General: Chronically ill-appearing, no acute distress HENT: [place], AT, OP  clear, MMM Eyes: EOMI, no scleral icterus Respiratory: Clear to auscultation bilaterally.  No crackles, wheezing or rales Cardiovascular: RRR, -M/R/G, no JVD GI: BS+, soft, nontender Extremities:-Edema,-tenderness Neuro: Eyes open spontaneously, does not follow commands, moves RUE and RLE spontaneously, no withdrawal to pain, left hemiparesis GU: Condom cath in place   Assessment & Plan:   Acute hypoxic respiratory failure. Self-extubated on 9/5. Stable on [place] - Wean  supplemental oxygen for goal 88-92% - Pulmonary toilet - Continue to monitor for clinical deterioration  MRSA PNA - Continue Vanc Day 3 of 7  Encephalopathy: seizures, sedating meds, ICU delirium Seizures with concern for status. - Appreciate Neurology recs - Continue vimpat, keppra, ativan PRN.  AKI secondary contrast nephropathy - started on HD on 8/24 (TTS schedule). Potential renal recovery. Dialysis holiday this weekend - Appreciate Nephrology input. Continue holding dialysis - Monitor UOP  Hypernatremia - Unable to receive free water flushes due to enteral access - Start D5 gtt x 12 hours - Obtain enteral access  Hypertension - Amlodipine - PRN BB and hydralazine  Protein calorie malnutrition. - Tube feeds per nutrition.  Hx DM. - SSI.  Best Practice:  Diet: Obtain enteral access to resume TF. Will need [place]/NGT until PEG can be arranged Pain/Anxiety/Delirium protocol (if indicated): - VAP protocol (if indicated): - DVT prophylaxis: SCD's / Heparin. GI prophylaxis: PPI. Glucose control: SSI. Mobility: Bedrest. Code Status: Full. Family Communication: Updated daughter Maxine GlennMonica via phone on 9/6 Disposition:   Labs   CBC: Recent Labs  Lab 03/31/19 2317 04/01/19 0228 04/01/19 0449 04/01/19 2129 04/03/19 0511 04/03/19 0521 04/04/19 0435  WBC 10.9* 10.9*  --   --   --  7.2  --   HGB 9.4* 9.6* 11.2* 8.5* 9.9* 9.2* 9.2*  HCT 27.5* 28.1* 33.0* 25.0* 29.0* 28.3* 27.0*  MCV 79.9* 79.8*  --   --   --  83.5  --   PLT 312 315  --   --   --  316  --    Basic Metabolic Panel: Recent Labs  Lab 03/31/19 2317  04/01/19 0228  04/01/19 1135  04/01/19 2316 04/02/19 0634 04/02/19 1611 04/03/19 0511 04/03/19 0521 04/04/19 0435 04/04/19 0452  NA 139  --  138   < >  --    < >  --  147*  --  154* 151* 158* 155*  K 3.7  --  3.9   < >  --    < >  --  3.5  --  3.7 3.6 3.9 3.8  CL 103  --  102  --   --   --   --  113*  --   --  117*  --  122*  CO2 22  --  21*  --    --   --   --  22  --   --  21*  --  24  GLUCOSE 91  --  91  --   --   --   --  95  --   --  135*  --  130*  BUN 47*  --  49*  --   --   --   --  58*  --   --  65*  --  64*  CREATININE 5.96*  --  6.02*  --   --   --   --  6.39*  --   --  5.92*  --  5.17*  CALCIUM 8.4*  --  8.2*  --   --   --   --  8.3*  --   --  8.3*  --  8.3*  MG  --    < > 2.2  --  2.1  --  2.3 2.3 2.3  --  2.1  --   --   PHOS  --    < > 4.7*  --  4.4  --  3.9 4.8* 4.6  --  4.6  --  4.7*   < > = values in this interval not displayed.   GFR: Estimated Creatinine Clearance: 16.1 mL/min (A) (by C-G formula based on SCr of 5.17 mg/dL (H)). Recent Labs  Lab 03/31/19 2317 04/01/19 0228 04/03/19 0521  WBC 10.9* 10.9* 7.2   Liver Function Tests: Recent Labs  Lab 03/31/19 2317 04/02/19 0634 04/04/19 0452  AST 63*  --   --   ALT 81*  --   --   ALKPHOS 52  --   --   BILITOT 0.8  --   --   PROT 6.7  --   --   ALBUMIN 1.9* 1.9* 1.9*   No results for input(s): LIPASE, AMYLASE in the last 168 hours. Recent Labs  Lab 04/02/19 2030  AMMONIA 17   ABG    Component Value Date/Time   PHART 7.424 04/04/2019 0435   PCO2ART 35.7 04/04/2019 0435   PO2ART 74.0 (L) 04/04/2019 0435   HCO3 23.3 04/04/2019 0435   TCO2 24 04/04/2019 0435   ACIDBASEDEF 1.0 04/04/2019 0435   O2SAT 95.0 04/04/2019 0435    Coagulation Profile: No results for input(s): INR, PROTIME in the last 168 hours. Cardiac Enzymes: No results for input(s): CKTOTAL, CKMB, CKMBINDEX, TROPONINI in the last 168 hours. HbA1C: Hgb A1c MFr Bld  Date/Time Value Ref Range Status  03/31/2019 11:30 PM 7.0 (H) 4.8 - 5.6 % Final    Comment:    (NOTE) Pre diabetes:          5.7%-6.4% Diabetes:              >6.4% Glycemic control for   <7.0% adults with diabetes   12/16/2010 04:00 AM  <5.7 % Final   5.6 (NOTE)                                                                       According to the ADA Clinical Practice Recommendations for 2011, when HbA1c is used  as a screening test:   >=6.5%   Diagnostic of Diabetes Mellitus           (if abnormal result  is confirmed)  5.7-6.4%   Increased risk of developing Diabetes Mellitus  References:Diagnosis and Classification of Diabetes Mellitus,Diabetes Care,2011,34(Suppl 1):S62-S69 and Standards of Medical Care in         Diabetes - 2011,Diabetes Care,2011,34  (Suppl 1):S11-S61.   CBG: Recent Labs  Lab 04/03/19 1154 04/03/19 1642 04/03/19 1917 04/03/19 2300 04/04/19 0319  GLUCAP 114* 126* 132* 156* 167*    The patient requires high complexity decision making for assessment and support, frequent evaluation and titration of therapies, application of advanced monitoring technologies and extensive interpretation of multiple databases.   Critical Care Time devoted to patient care services described in this note is 31 Minutes. This time reflects time of care of this signee Dr. Erskine Squibb  Chad AllEllison. This critical care time does not reflect procedure time, or teaching time or supervisory time of PA/NP/Med student/Med Resident etc but could involve care discussion time.  Mechele CollinJane Maurina Fawaz, M.D. [facility]monary/Critical [facility] Medicine 04/04/2019 7:51 AM  Pager: [phone] After hours pager: [phone]

## 2019-04-04 NOTE — Progress Notes (Addendum)
Pharmacy Antibiotic Note  Chad Mcclure is a 63 y.o. male admitted on 03/31/2019 with pneumonia.  [name] cultures are growing moderate staph aureus - the sensitivities should return tomorrow. Pt is AKI and received HD - TuThSat at outside hospital.  Patient showing some signs of renal recovery, need for dialysis being assessed daily.  Random vanc level yesterday 9/5 - 20 mg/dl - per nephrology no dialysis today. Random vanc level this am 9/6 - 16 mg/dl.  Plan: Will redose Vanc 1500 mg IV x 1 today (Day 3 of 7) Will continue to monitor renal function and dialysis need and draw vanc levels as appropriate.   Height: 6' (182.9 cm) Weight: 188 lb 0.8 oz (85.3 kg) IBW/kg (Calculated) : 77.6  Temp (24hrs), Avg:99.3 F (37.4 C), Min:98.6 F (37 C), Max:99.8 F (37.7 C)  Recent Labs  Lab 03/31/19 2317 04/01/19 0228 04/02/19 0634 04/03/19 0521 04/03/19 0928 04/04/19 0452  WBC 10.9* 10.9*  --  7.2  --   --   CREATININE 5.96* 6.02* 6.39* 5.92*  --  5.17*  VANCORANDOM  --   --   --   --  20 16    Estimated Creatinine Clearance: 16.1 mL/min (A) (by C-G formula based on SCr of 5.17 mg/dL (H)).    Per [name] Sputem - MRSA  9/3 Sputem here also MRSA  [name], PharmD, [facility] Clinical Pharmacist Please see AMION for all Pharmacists' Contact Phone Numbers 04/04/2019, 8:07 AM

## 2019-04-04 NOTE — Significant Event (Signed)
OVERNIGHT COVERAGE CRITICAL CARE PROGRESS NOTE  CTSP re: suspected self-extubation.  The patient was reportedly having episodes of coughing and proceeded to start to phonate.  Bedside examination suggests that the ETT cuff was above the cords.  Nurse reports that the patient just received fentanyl, which was administered in hopes of suppressing his cough.  Cough and gag are brisk.  Opens and closes eyes on command.  Persistent dysconjugate gaze.  Squeezes R hand on command, weak.  L hemiparesis, old.  ETT cuff is obviously above the cords.  Lungs are clear to auscultation anteriorly.   Extubated.  Keep HOB > 30 degrees at all times.  Monitor his respiratory status closely.  Supplemental oxygen to maintain SpO2 93+%.  Check ABG in 1 hour.  Renee Pain, MD Board Certified by the ABIM, [place]

## 2019-04-05 DIAGNOSIS — J15212 Pneumonia due to Methicillin resistant Staphylococcus aureus: Secondary | ICD-10-CM

## 2019-04-05 DIAGNOSIS — R569 Unspecified convulsions: Secondary | ICD-10-CM

## 2019-04-05 LAB — BASIC METABOLIC PANEL
Anion gap: 11 (ref 5–15)
BUN: 64 mg/dL — ABNORMAL HIGH (ref 8–23)
CO2: 22 mmol/L (ref 22–32)
Calcium: 8.8 mg/dL — ABNORMAL LOW (ref 8.9–10.3)
Chloride: 126 mmol/L — ABNORMAL HIGH (ref 98–111)
Creatinine, Ser: 4.31 mg/dL — ABNORMAL HIGH (ref 0.61–1.24)
GFR calc Af Amer: 16 mL/min — ABNORMAL LOW (ref >60)
GFR calc non Af Amer: 14 mL/min — ABNORMAL LOW (ref >60)
Glucose, Bld: 183 mg/dL — ABNORMAL HIGH (ref 70–99)
Potassium: 3.6 mmol/L (ref 3.5–5.1)
Sodium: 159 mmol/L — ABNORMAL HIGH (ref 135–145)

## 2019-04-05 LAB — GLUCOSE, CAPILLARY
Glucose-Capillary: 176 mg/dL — ABNORMAL HIGH (ref 70–99)
Glucose-Capillary: 129 mg/dL — ABNORMAL HIGH (ref 70–99)
Glucose-Capillary: 202 mg/dL — ABNORMAL HIGH (ref 70–99)
Glucose-Capillary: 166 mg/dL — ABNORMAL HIGH (ref 70–99)
Glucose-Capillary: 160 mg/dL — ABNORMAL HIGH (ref 70–99)
Glucose-Capillary: 154 mg/dL — ABNORMAL HIGH (ref 70–99)

## 2019-04-05 NOTE — Evaluation (Signed)
Physical Therapy Evaluation Patient Details Name: [facility] MRN: [id number] DOB: 12-02-1955 Today's Date: 04/05/2019   History of Present Illness  [facility]ad [facility] is a 63 y.o. male who has a PMH including but not limited to HTN, HLD, HCV, Left CVA, DM, schizophrenia, dementia and who resides at [facility] in [place].  He was admitted to [facility] on 03/18/19 with fever due PNA, required intubated on 8/21 for hypoxic respiratory failure, extubated 8/29 then required re-intubation 8/31 for staus epilepticus then transferred to [facility] 9/2.  Clinical Impression  Pt admitted with above. Pt non-verbal this session and required maxA for EOB transfer and sitting. Pt with 50% simple command follow. Unable to determine prior level of function but per chart patient resides in SNF. Recommend return to SNF with therapy services. Acute PT to cont to follow.    Follow Up Recommendations SNF    Equipment Recommendations  None recommended by PT    Recommendations for Other Services       Precautions / Restrictions Precautions Precautions: Fall Restrictions Weight Bearing Restrictions: No      Mobility  Bed Mobility Overal bed mobility: Needs Assistance Bed Mobility: Supine to Sit;Sit to Supine     Supine to sit: Max assist Sit to supine: Max assist   General bed mobility comments: pt did initiate R UE and LE to assist with transfer to EOB however ultimately required maxA to transfer to EOB and maintain EOB sitting  Transfers                 General transfer comment: deferred today due to inability to safely complete with 1 person  Ambulation/Gait             General Gait Details: deferred today  Stairs            Wheelchair Mobility    Modified Rankin (Stroke Patients Only)       Balance Overall balance assessment: Needs assistance Sitting-balance support: Feet supported;Bilateral upper extremity supported Sitting balance-Leahy Scale: Poor Sitting  balance - Comments: required min/modA to maintain EOB balance, pt with L lateral lean without ability to self correct                                     Pertinent Vitals/Pain Pain Assessment: Faces Faces Pain Scale: No hurt Pain Location: pt withdrew R LE when PT attempted to take SCD off    Home Living Family/patient expects to be discharged to:: Skilled nursing facility                 Additional Comments: per chart pt resides in SNF in [facility], pt non-verbal    Prior Function Level of Independence: Needs assistance         Comments: unsure as pt non-verbal and unable to report PLOF, daughter unable to be reached     Hand Dominance        Extremity/Trunk Assessment   Upper Extremity Assessment Upper Extremity Assessment: RUE deficits/detail;LUE deficits/detail RUE Deficits / Details: minimal movement, squeezed hand but wouldn't raise it or bend elbow to command LUE Deficits / Details: no active movement, fingers in flexion contracture, no functional use to hold self up at EOB with L UE    Lower Extremity Assessment Lower Extremity Assessment: RLE deficits/detail;LLE deficits/detail RLE Deficits / Details: to command at EOB pt able to initiate LAQ but less than 1/2 the range, withdrew  to pain bringing knee up to chest LLE Deficits / Details: no active movement of L LE, able to tolerate ROM    Cervical / Trunk Assessment Cervical / Trunk Assessment: Kyphotic  Communication   Communication: Expressive difficulties(pt non-verbal)  Cognition Arousal/Alertness: Awake/alert Behavior During Therapy: Flat affect Overall Cognitive Status: No family/caregiver present to determine baseline cognitive functioning                                 General Comments: pt following simple commands 50% of time, pt did initiated task of sitting EOB when asked, unsure of patients baseline cognition      General Comments General comments (skin  integrity, edema, etc.): VSS, able to track with eyes toward name    Exercises     Assessment/Plan    PT Assessment Patient needs continued PT services  PT Problem List Decreased strength;Decreased range of motion;Decreased activity tolerance;Decreased balance;Decreased mobility;Decreased coordination;Decreased cognition;Decreased knowledge of use of DME;Decreased safety awareness;Decreased knowledge of precautions       PT Treatment Interventions Gait training;DME instruction;Functional mobility training;Therapeutic activities;Therapeutic exercise;Balance training;Neuromuscular re-education;Cognitive remediation;Patient/family education    PT Goals (Current goals can be found in the Care Plan section)  Acute Rehab PT Goals Patient Stated Goal: . PT Goal Formulation: Patient unable to participate in goal setting Time For Goal Achievement: 04/19/19 Potential to Achieve Goals: Fair    Frequency Min 2X/week   Barriers to discharge        Co-evaluation               AM-PAC PT "6 Clicks" Mobility  Outcome Measure Help needed turning from your back to your side while in a flat bed without using bedrails?: A Lot Help needed moving from lying on your back to sitting on the side of a flat bed without using bedrails?: A Lot Help needed moving to and from a bed to a chair (including a wheelchair)?: A Lot Help needed standing up from a chair using your arms (e.g., wheelchair or bedside chair)?: Total Help needed to walk in [facility] room?: Total Help needed climbing 3-5 steps with a railing? : Total 6 Click Score: 9    End of Session Equipment Utilized During Treatment: Oxygen Activity Tolerance: Patient tolerated treatment well Patient left: in bed;with call bell/phone within reach;with bed alarm set Nurse Communication: Mobility status PT Visit Diagnosis: Unsteadiness on feet (R26.81);Muscle weakness (generalized) (M62.81);Difficulty in walking, not elsewhere classified  (R26.2)    Time: [id number] PT Time Calculation (min) (ACUTE ONLY): 20 min   Charges:   PT Evaluation $PT Eval Moderate Complexity: 1 Mod          Lewis ShockAshly Mystie Ormand, PT, DPT Acute Rehabilitation Services Pager #: [phone] Office #: [phone]   Iona Hansenshly M Blessing Zaucha 04/05/2019, 2:35 PM

## 2019-04-05 NOTE — Progress Notes (Signed)
Neurology Progress Note   S:// Seen and examined this morning.  No acute changes.   O:// Current vital signs: BP (!) 149/94 (BP Location: Left Arm)   Pulse 98   Temp 98.7 F (37.1 C) (Oral)   Resp 20   Ht 6' (1.829 m)   Wt 85.3 kg   SpO2 90%   BMI 25.50 kg/m  Vital signs in last 24 hours: Temp:  [98.6 F (37 C)-99.1 F (37.3 C)] 98.7 F (37.1 C) (09/07 1221) Pulse Rate:  [86-105] 98 (09/07 1221) Resp:  [20-28] 20 (09/07 1221) BP: (134-168)/(79-102) 149/94 (09/07 1221) SpO2:  [90 %-100 %] 90 % (09/07 0747) General: Awake, in no distress HEENT: Normocephalic atraumatic Respiratory: Breathing normally saturating well on room air. Cardiovascular: Regular rate rhythm Neurological exam Mental status: He is awake, no distress, able to follow commands on the right.  He was able to say his name yesterday to Dr. Leonel Ramsay but did not verbalize or mouth words for me today. Cranial nerves: Pupil exam showed slightly larger right pupil which is sluggish, left pupil is round and briskly reactive, mild disconjugate gaze, possible left facial weakness. Motor exam: Contractures with chronic left hemiparesis.  Moves right upper and lower extremity to command. Sensory exam: Intact to noxious stimulation in all fours DTRs brisk on the left hand compared to right.  Medications  Current Facility-Administered Medications:  .  0.9 %  sodium chloride infusion, , Intravenous, PRN, Renee Pain, MD, Stopped at 04/02/19 0029 .  0.9 %  sodium chloride infusion, , Intravenous, PRN, Deterding, Guadelupe Sabin, MD, Last Rate: 10 mL/hr at 04/04/19 2000 .  acetaminophen (TYLENOL) solution 500 mg, 500 mg, Per Tube, Q6H PRN, Margaretha Seeds, MD, 500 mg at 04/04/19 1650 .  amLODipine (NORVASC) tablet 10 mg, 10 mg, Per Tube, Daily, Elsie Lincoln, MD, 10 mg at 04/05/19 0912 .  chlorhexidine gluconate (MEDLINE KIT) (PERIDEX) 0.12 % solution 15 mL, 15 mL, Mouth Rinse, BID, Desai, Rahul P, PA-C, 15 mL at  04/05/19 0900 .  Chlorhexidine Gluconate Cloth 2 % PADS 6 each, 6 each, Topical, Daily, Desai, Rahul P, PA-C, 6 each at 04/05/19 0444 .  feeding supplement (PRO-STAT SUGAR FREE 64) liquid 30 mL, 30 mL, Per Tube, TID, Chesley Mires, MD, 30 mL at 04/05/19 0912 .  feeding supplement (VITAL 1.5 CAL) liquid 1,000 mL, 1,000 mL, Per Tube, Continuous, Sood, Vineet, MD, Last Rate: 50 mL/hr at 04/04/19 1612, 1,000 mL at 04/04/19 1612 .  fentaNYL (SUBLIMAZE) injection 50 mcg, 50 mcg, Intravenous, Q15 min PRN, Desai, Rahul P, PA-C, 50 mcg at 03/31/19 2230 .  fentaNYL (SUBLIMAZE) injection 50-200 mcg, 50-200 mcg, Intravenous, Q30 min PRN, Desai, Rahul P, PA-C, 100 mcg at 04/04/19 0334 .  free water 400 mL, 400 mL, Per Tube, Q8H, Rosita Fire, MD, 400 mL at 04/05/19 0558 .  heparin injection 5,000 Units, 5,000 Units, Subcutaneous, Q8H, Desai, Rahul P, PA-C, 5,000 Units at 04/05/19 0558 .  hydrALAZINE (APRESOLINE) injection 10 mg, 10 mg, Intravenous, Q4H PRN, Margaretha Seeds, MD, 10 mg at 04/04/19 0921 .  insulin aspart (novoLOG) injection 0-9 Units, 0-9 Units, Subcutaneous, Q4H, Desai, Rahul P, PA-C, 1 Units at 04/05/19 0900 .  lacosamide (VIMPAT) 100 mg in sodium chloride 0.9 % 25 mL IVPB, 100 mg, Intravenous, Q12H, Desai, Rahul P, PA-C, Last Rate: 70 mL/hr at 04/05/19 0950, 100 mg at 04/05/19 0950 .  levETIRAcetam (KEPPRA) IVPB 500 mg/100 mL premix, 500 mg, Intravenous, Q12H, Desai, Rahul P, PA-C,  Last Rate: 400 mL/hr at 04/05/19 0920, 500 mg at 04/05/19 0920 .  LORazepam (ATIVAN) injection 1-4 mg, 1-4 mg, Intravenous, Q2H PRN, Desai, Rahul P, PA-C, 2 mg at 04/03/19 0500 .  MEDLINE mouth rinse, 15 mL, Mouth Rinse, 10 times per day, Desai, Rahul P, PA-C, 15 mL at 04/05/19 0903 .  pantoprazole sodium (PROTONIX) 40 mg/20 mL oral suspension 40 mg, 40 mg, Per Tube, Daily, Agarwala, Ravi, MD, 40 mg at 04/05/19 0912 .  sodium chloride flush (NS) 0.9 % injection 10-40 mL, 10-40 mL, Intracatheter, PRN, Sood,  Vineet, MD .  vancomycin variable dose per unstable renal function (pharmacist dosing), , Does not apply, See admin instructions, Rush Farmer, MD Labs CBC    Component Value Date/Time   WBC 7.2 04/03/2019 0521   RBC 3.39 (L) 04/03/2019 0521   HGB 9.2 (L) 04/04/2019 0435   HCT 27.0 (L) 04/04/2019 0435   PLT 316 04/03/2019 0521   MCV 83.5 04/03/2019 0521   MCH 27.1 04/03/2019 0521   MCHC 32.5 04/03/2019 0521   RDW 15.6 (H) 04/03/2019 0521   LYMPHSABS 3.2 12/18/2010 0400   MONOABS 0.8 12/18/2010 0400   EOSABS 0.0 12/18/2010 0400   BASOSABS 0.0 12/18/2010 0400    CMP     Component Value Date/Time   NA 159 (H) 04/05/2019 0947   K 3.6 04/05/2019 0947   CL 126 (H) 04/05/2019 0947   CO2 22 04/05/2019 0947   GLUCOSE 183 (H) 04/05/2019 0947   BUN 64 (H) 04/05/2019 0947   CREATININE 4.31 (H) 04/05/2019 0947   CALCIUM 8.8 (L) 04/05/2019 0947   PROT 6.7 03/31/2019 2317   ALBUMIN 1.9 (L) 04/04/2019 0452   AST 63 (H) 03/31/2019 2317   ALT 81 (H) 03/31/2019 2317   ALKPHOS 52 03/31/2019 2317   BILITOT 0.8 03/31/2019 2317   GFRNONAA 14 (L) 04/05/2019 0947   GFRAA 16 (L) 04/05/2019 0947    Ammonia and TSH were normal limits last checked.   Imaging I have reviewed images in epic and the results pertinent to this consultation are: MRI brain- chronic small vessel disease, chronic atrophy disproportionate to age.  No acute findings. MRA head-no P-comm aneurysm  Assessment: 63 year old man persistent encephalopathy status post seizure and possible respiratory failure due to pneumonia, transferred from an outside hospital. EEG on 04/01/2019-diffuse encephalopathy, no seizures. Possibilities for persistent encephalopathy included during this metabolic syndrome of sedating medications due to renal failure, ICU delirium or sequela of multiple frequent seizures or possible status epilepticus prior to presentation. He appears to be showing improvement based on chart review and my exam  today. Due to the disconjugate gaze and asymmetric pupillary size, MRA head was done-no aneurysm or other vascular abnormalities identified. No clinical seizures seen Dr. Leonel Ramsay spoke with the patient's daughter, who did not know that the patient has a diagnosis of dementia schizophrenia but he does have some cognitive slowing due to his previous stroke according to the daughter. Looking at his imaging, it does appear that he has chronic atrophy and chronic small vessel disease which is not surprising.  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  Recommendations: Continue Keppra 500 twice daily Continue Vimpat 100 twice daily I would expect his mental status might show some improvement with time going forward.  I will follow him with you. We will relay the plan to Dr. Chanda Busing, who is the primary attending.  -- Amie Portland, MD [facility] Pager: [phone] If 7pm to 7am, please call on call as listed on AMION.

## 2019-04-05 NOTE — Progress Notes (Addendum)
Admit: 03/31/2019 LOS: 5  [facility] with AKI, req HD, now on hold, AMS  Subjective:  . AM labs pending  . 3 L urine output, evenly matched . BP stable  09/06 0701 - 09/07 0700 In: 3061.8 [I.V.:501.9; NG/GT:1790; IV Piggyback:769.9] Out: 3020 [WVTVN:5041]  Filed Weights   04/01/19 0500 04/02/19 0500 04/04/19 0500  Weight: 92.5 kg 88.3 kg 85.3 kg    Scheduled Meds: . amLODipine  10 mg Per Tube Daily  . chlorhexidine gluconate (MEDLINE KIT)  15 mL Mouth Rinse BID  . Chlorhexidine Gluconate Cloth  6 each Topical Daily  . feeding supplement (PRO-STAT SUGAR FREE 64)  30 mL Per Tube TID  . free water  400 mL Per Tube Q8H  . heparin  5,000 Units Subcutaneous Q8H  . insulin aspart  0-9 Units Subcutaneous Q4H  . mouth rinse  15 mL Mouth Rinse 10 times per day  . pantoprazole sodium  40 mg Per Tube Daily  . vancomycin variable dose per unstable renal function (pharmacist dosing)   Does not apply See admin instructions   Continuous Infusions: . sodium chloride Stopped (04/02/19 0029)  . sodium chloride 10 mL/hr at 04/04/19 2000  . feeding supplement (VITAL 1.5 CAL) 1,000 mL (04/04/19 1612)  . lacosamide (VIMPAT) IV 100 mg (04/05/19 0950)  . levETIRAcetam 500 mg (04/05/19 0920)   PRN Meds:.sodium chloride, sodium chloride, acetaminophen (TYLENOL) oral liquid 160 mg/5 mL, fentaNYL (SUBLIMAZE) injection, fentaNYL (SUBLIMAZE) injection, hydrALAZINE, LORazepam, sodium chloride flush  Current Labs: reviewed  Physical Exam:  Blood pressure (!) 156/91, pulse (!) 105, temperature 98.8 F (37.1 C), temperature source Oral, resp. rate (!) 26, height 6' (1.829 m), weight 85.3 kg, SpO2 90 %. Chronically ill-appearing, encephalopathic, not interactive Tachycardic, regular, normal S1 and S2 Coarse breath sounds bilaterally, normal work of breathing Soft, nontender  A 1. AKI, previously required dialysis, etiology contrast nephropathy, improving excellent U OP and downtrending creatinine consistent  with recovery 2. Hypernatremia, likely solute diuresis, 3. Encephalopathy, per neurology and CCM 4. Acute hypoxic respiratory failure, self extubation 9/5, stable on nasal cannula 5. Hypertension, on amlodipine, stable 6. SNF resident 7. Schizophrenia 8. Chart history of dementia 9. History of left CVA  P . Await a.m. labs, titrate free water as needed . Follow for another 24 hours, anticipate significant recovery of GFR; unclear baseline creatinine, assumed normal . Medication Issues; o Preferred narcotic agents for pain control are hydromorphone, fentanyl, and methadone. Morphine should not be used.  o Baclofen should be avoided o Avoid oral sodium phosphate and magnesium citrate based laxatives / bowel preps    Pearson Grippe MD 04/05/2019, 10:54 AM  Recent Labs  Lab 04/02/19 3643 04/02/19 1611  04/03/19 0521 04/04/19 0435 04/04/19 0452  NA 147*  --    < > 151* 158* 155*  K 3.5  --    < > 3.6 3.9 3.8  CL 113*  --   --  117*  --  122*  CO2 22  --   --  21*  --  24  GLUCOSE 95  --   --  135*  --  130*  BUN 58*  --   --  65*  --  64*  CREATININE 6.39*  --   --  5.92*  --  5.17*  CALCIUM 8.3*  --   --  8.3*  --  8.3*  PHOS 4.8* 4.6  --  4.6  --  4.7*   < > = values in this interval not  displayed.   Recent Labs  Lab 03/31/19 2317 04/01/19 0228  04/03/19 0511 04/03/19 0521 04/04/19 0435  WBC 10.9* 10.9*  --   --  7.2  --   HGB 9.4* 9.6*   < > 9.9* 9.2* 9.2*  HCT 27.5* 28.1*   < > 29.0* 28.3* 27.0*  MCV 79.9* 79.8*  --   --  83.5  --   PLT 312 315  --   --  316  --    < > = values in this interval not displayed.

## 2019-04-05 NOTE — Progress Notes (Addendum)
NAME:  Chad Mcclure, MRN:  [id number], DOB:  02/28/1956, LOS: 5 ADMISSION DATE:  03/31/2019, CONSULTATION DATE:  03/31/19 REFERRING MD:  [facility]  CHIEF COMPLAINT:  Status epilepticus   Brief History   Chad Mcclure is a 63 y.o. male who was initially admitted to [name] 8/20 with fevers felt to be due to PNA and cystitis. Required intubation 8/21 for hypoxic respiratory failure.  Extubated 8/29 then required re-intubation 8/31 for status epilepticus. Transferred to [facility] 9/2.   History of present illness   Pt is encephelopathic; therefore, this HPI is obtained from chart review. Chad Mcclure is a 63 y.o. male who has a PMH including but not limited to HTN, HLD, HCV, Left CVA, DM, schizophrenia, dementia and who resides at [facility] in [place] (see "past medical history" for rest).  He was admitted to [facility] on 03/18/19 with fever.  He was started on vanc / zosyn for suspected PNA and cystitis.  See below for a list of events from the hospitalization: 8/21, rapid response was activated for hypoxic respiratory failure and pt was transferred to the ICU where he required intubation.   Was on propofol initially which was later switched to fentanyl due to hypertriglyceridemia. 8/24, he required initiation of hemodialysis after failing a lasix challenge, TTS schedule planned (R subclavian perm cath placed). 8/26, he had a witnessed aspiration event, zosyn continued.   8/29 he passed SBT and was extubated. 8/31 had seizures and status epilepticus requiring re-intubation.  Treated with ativan, vimpat, keppra. 9/1 EEG negative for seizures. 9/2 propofol discontinued but pt remained encephalopathic / unresponsive.  Later transferred to [facility] for further evaluation and management.     Past Medical History  HTN, HLD, HCV, Left CVA, DM, schizophrenia, dementia.  Significant Hospital Events   8/20 > admit. 8/21 > intubation. 8/24 > HD started. 8/29 > extubated. 8/31 > seizures /  status. 9/2 > transferred to [facility]. 9/4 >Tolerated PS. No extubation d/t mental status 9/5 >Self-extubated and remained stable on [place] 9/6 > Monitored respirator status 9/7 >Tolerating 3L O2  Consults:  Neurology. Nephrology.  Procedures:  ETT 8/21 > 8/29.  8/31 >  [facility] 8/24 >  L IJ CVL 8/22 >   Significant Diagnostic Tests:  EEG 9/1 > neg. CT head 9/2 >  EEG 9/3 > neg for sz MRI 9/3 > chronic microvascular changes, remote lacunar infarcts  Micro Data:  SARS CoV2 8/20 > neg. Trach Asp 9/3 > MRSA  Antimicrobials:  Zosyn 9/2  Meropenum 9/3 Vanc 9/3>  Interim history/subjective:  Stable on 3L O2. DC'd CVC yesterday. NGT placed and started on TF and FWF  Objective:  Blood pressure (!) 156/91, pulse (!) 105, temperature 98.8 F (37.1 C), temperature source Oral, resp. rate (!) 26, height 6' (1.829 m), weight 85.3 kg, SpO2 90 %.        Intake/Output Summary (Last 24 hours) at 04/05/2019 0931 Last data filed at 04/05/2019 0400 Gross per 24 hour  Intake 3061.78 ml  Output 3020 ml  Net 41.78 ml   Filed Weights   04/01/19 0500 04/02/19 0500 04/04/19 0500  Weight: 92.5 kg 88.3 kg 85.3 kg   Physical Exam: General: Chronically ill-appearing, no acute distress, nonverbal HENT: [place], AT, OP clear, MMM, NGT in place Eyes: Tracks, EOMI, no scleral icterus Respiratory: Clear to auscultation bilaterally.  No crackles, wheezing or rales Cardiovascular: RRR, -M/R/G, no JVD GI: BS+, soft, nontender Extremities:-Edema,-tenderness Neuro: Opens eyes to voice, follows commands  including opening and closing eyes and moving RLE 1/5 . Moves RUE spontaneously. Left hemiparesis  Assessment & Plan:   Acute hypoxic respiratory failure. Self-extubated on 9/5. Stable on  - Wean supplemental oxygen for goal 88-92% - Pulmonary toilet  MRSA PNA - Continue Vanc Day 4 of 7  Encephalopathy: seizures, sedating meds, ICU delirium Seizures with concern for status. - Appreciate Neurology recs -  Continue vimpat, keppra, ativan PRN - PT/OT  AKI secondary contrast nephropathy - started on HD on 8/24 (TTS schedule). Potential renal recovery. Dialysis holiday this weekend - Continue to hold dialysis per Nephrology - Monitor UOP  Hypernatremia - FWF - Trend BMP  Hypertension - Amlodipine - PRN BB and hydralazine  Protein calorie malnutrition. - Tube feeds per nutrition.  Hx DM. - SSI.  Best Practice:  Diet: TF, FWF Pain/Anxiety/Delirium protocol (if indicated): - VAP protocol (if indicated): - DVT prophylaxis: SCD's / Heparin. GI prophylaxis: PPI. Glucose control: SSI. Mobility: Bedrest. Code Status: Full. Family Communication: Updated daughter Maxine GlennMonica via phone on 9/6. Attempted to call on 9/7 with no answer; LMTCB Disposition: [facility]. [facility]  Labs   CBC: Recent Labs  Lab 03/31/19 2317 04/01/19 0228 04/01/19 0449 04/01/19 2129 04/03/19 0511 04/03/19 0521 04/04/19 0435  WBC 10.9* 10.9*  --   --   --  7.2  --   HGB 9.4* 9.6* 11.2* 8.5* 9.9* 9.2* 9.2*  HCT 27.5* 28.1* 33.0* 25.0* 29.0* 28.3* 27.0*  MCV 79.9* 79.8*  --   --   --  83.5  --   PLT 312 315  --   --   --  316  --    Basic Metabolic Panel: Recent Labs  Lab 03/31/19 2317  04/01/19 0228  04/01/19 1135  04/01/19 2316 04/02/19 0634 04/02/19 1611 04/03/19 0511 04/03/19 0521 04/04/19 0435 04/04/19 0452  NA 139  --  138   < >  --    < >  --  147*  --  154* 151* 158* 155*  K 3.7  --  3.9   < >  --    < >  --  3.5  --  3.7 3.6 3.9 3.8  CL 103  --  102  --   --   --   --  113*  --   --  117*  --  122*  CO2 22  --  21*  --   --   --   --  22  --   --  21*  --  24  GLUCOSE 91  --  91  --   --   --   --  95  --   --  135*  --  130*  BUN 47*  --  49*  --   --   --   --  58*  --   --  65*  --  64*  CREATININE 5.96*  --  6.02*  --   --   --   --  6.39*  --   --  5.92*  --  5.17*  CALCIUM 8.4*  --  8.2*  --   --   --   --  8.3*  --   --  8.3*  --  8.3*  MG  --     < > 2.2  --  2.1  --  2.3 2.3 2.3  --  2.1  --   --   PHOS  --    < >  4.7*  --  4.4  --  3.9 4.8* 4.6  --  4.6  --  4.7*   < > = values in this interval not displayed.   GFR: Estimated Creatinine Clearance: 16.1 mL/min (A) (by C-G formula based on SCr of 5.17 mg/dL (H)). Recent Labs  Lab 03/31/19 2317 04/01/19 0228 04/03/19 0521  WBC 10.9* 10.9* 7.2   Liver Function Tests: Recent Labs  Lab 03/31/19 2317 04/02/19 0634 04/04/19 0452  AST 63*  --   --   ALT 81*  --   --   ALKPHOS 52  --   --   BILITOT 0.8  --   --   PROT 6.7  --   --   ALBUMIN 1.9* 1.9* 1.9*   No results for input(s): LIPASE, AMYLASE in the last 168 hours. Recent Labs  Lab 04/02/19 2030  AMMONIA 17   ABG    Component Value Date/Time   PHART 7.424 04/04/2019 0435   PCO2ART 35.7 04/04/2019 0435   PO2ART 74.0 (L) 04/04/2019 0435   HCO3 23.3 04/04/2019 0435   TCO2 24 04/04/2019 0435   ACIDBASEDEF 1.0 04/04/2019 0435   O2SAT 95.0 04/04/2019 0435    Coagulation Profile: No results for input(s): INR, PROTIME in the last 168 hours. Cardiac Enzymes: No results for input(s): CKTOTAL, CKMB, CKMBINDEX, TROPONINI in the last 168 hours. HbA1C: Hgb A1c MFr Bld  Date/Time Value Ref Range Status  03/31/2019 11:30 PM 7.0 (H) 4.8 - 5.6 % Final    Comment:    (NOTE) Pre diabetes:          5.7%-6.4% Diabetes:              >6.4% Glycemic control for   <7.0% adults with diabetes   12/16/2010 04:00 AM  <5.7 % Final   5.6 (NOTE)                                                                       According to the ADA Clinical Practice Recommendations for 2011, when HbA1c is used as a screening test:   >=6.5%   Diagnostic of Diabetes Mellitus           (if abnormal result  is confirmed)  5.7-6.4%   Increased risk of developing Diabetes Mellitus  References:Diagnosis and Classification of Diabetes Mellitus,Diabetes NATF,5732,20(URKYH 1):S62-S69 and Standards of Medical Care in         Diabetes - 2011,Diabetes  Care,2011,34  (Suppl 1):S11-S61.   CBG: Recent Labs  Lab 04/04/19 1141 04/04/19 1550 04/04/19 1927 04/05/19 0426 04/05/19 0748  GLUCAP 126* 133* 166* 176* 129*   Care Time devoted to patient care services described in this note is 36 Minutes. This time reflects time of care of this signee Dr. Rodman Pickle. This critical care time does not reflect procedure time, or teaching time or supervisory time of PA/NP/Med student/Med Resident etc but could involve care discussion time.  Rodman Pickle, M.D. [facility] 04/05/2019 9:31 AM  Pager: [phone] After hours pager: [phone]

## 2019-04-05 NOTE — Progress Notes (Addendum)
Occupational Therapy Evaluation Patient Details Name: Chad Mcclure MRN: [id number] DOB: 1955/10/22 Today's Date: 04/05/2019    History of Present Illness Chad Mcclure is a 63 y.o. male who has a PMH including but not limited to HTN, HLD, HCV, Left CVA, DM, schizophrenia, dementia and who [facility] [place].  [facility] with fever due PNA, required intubated on 8/21 for hypoxic respiratory failure, extubated 8/29 then required re-intubation 8/31 for staus epilepticus then [facility].   Clinical Impression   Per chart review, [facility] [place] and required assistance for ADLs. Pt currently requiring Max A for UB ADLs, Max-Total A for LB ADLs, and Max A for bed mobility. Pt presenting with decreased balance, activity tolerance, strength, and cognition. Also presenting with vision deficits and poor functional use of LUE/LLE. SpO2 >92% on RA throughout session; returned to 3L at end of session and notified RN. Pt would benefit from further acute OT to facilitate safe dc. Recommend dc to SNF for further OT to optimize safety, independence with ADLs, and return to PLOF.   Pt would benefit from left resting hand splint due to increased tone in hand and digits. Plan to communicate with MD and order. Will follow for splint schedule and management.     Follow Up Recommendations  SNF    Equipment Recommendations  Other (comment)(Defer to next venue)    Recommendations for Other Services PT consult     Precautions / Restrictions Precautions Precautions: Fall Restrictions Weight Bearing Restrictions: No      Mobility Bed Mobility Overal bed mobility: Needs Assistance Bed Mobility: Rolling;Sidelying to Sit;Sit to Sidelying Rolling: Min assist Sidelying to sit: Max assist;+2 for safety/equipment   Sit to sidelying: Max assist General bed mobility comments: Pt requiring Max cues to hold onto bedrail and  pull/roll to left side. Pt requiring Max A to elevate trunk into sitting. Requiring Max A to bring BLEs over EOB in returning to supine  Transfers                 General transfer comment: deferred today due to inability to safely complete with 1 person    Balance Overall balance assessment: Needs assistance Sitting-balance support: Feet supported;Bilateral upper extremity supported Sitting balance-Leahy Scale: Poor Sitting balance - Comments: required min/modA to maintain EOB balance, pt with L lateral lean without ability to self correct                                   ADL either performed or assessed with clinical judgement   ADL Overall ADL's : Needs assistance/impaired Eating/Feeding: NPO;Bed level   Grooming: Oral care;Maximal assistance;Sitting;Moderate assistance Grooming Details (indicate cue type and reason): Pt requiring Mod A for sitting balance. Max hand over hand (both with patient hold OT's wrist to perform oral care and hold toothbrush himself). Supporting pt at wrist and elbow. Pt requiring assistance for back and forth motion.  Upper Body Bathing: Maximal assistance;Sitting   Lower Body Bathing: Total assistance;Bed level   Upper Body Dressing : Maximal assistance;Sitting   Lower Body Dressing: Total assistance;Bed level               Functional mobility during ADLs: (Defered for safety) General ADL Comments: Pt presenting with poor balance, cognition, and strength. Pariticpating in oral care while seated at EOB     Vision  Patient Visual Report: Other (comment)(Unsure; pt unable to provide info)       Perception     Praxis      Pertinent Vitals/Pain Pain Assessment: Faces Faces Pain Scale: No hurt Pain Location: pt withdrew R LE when PT attempted to take SCD off Pain Intervention(s): Monitored during session     Hand Dominance     Extremity/Trunk Assessment Upper Extremity Assessment Upper Extremity Assessment: RUE  deficits/detail;LUE deficits/detail RUE Deficits / Details: Pt with poor coorindation. Able to grasp OT's hand during oral care. LUE Deficits / Details: no active movement. Noting increased tone in hands and digits with poor extension of digits. Tendency for flexion pattern LUE Coordination: decreased fine motor;decreased gross motor   Lower Extremity Assessment Lower Extremity Assessment: Defer to PT evaluation RLE Deficits / Details: to command at EOB pt able to initiate LAQ but less than 1/2 the range, withdrew to pain bringing knee up to chest LLE Deficits / Details: no active movement of L LE, able to tolerate ROM   Cervical / Trunk Assessment Cervical / Trunk Assessment: Kyphotic;Other exceptions Cervical / Trunk Exceptions: Decreased trunk control   Communication Communication Communication: Expressive difficulties(Pt mouthing and softly stating his name with simple cues)   Cognition Arousal/Alertness: Awake/alert Behavior During Therapy: Flat affect Overall Cognitive Status: No family/caregiver present to determine baseline cognitive functioning                                 General Comments: Pt softly stating his name. Pt following cues ~50% of time; simple, direct cues.  Pt with poor attention, awareness, and sequencing.    General Comments  VSS throughout and SpO2 >92% on RA; notified RN. Would benefit from rest hand splint    Exercises     Shoulder Instructions      Home Living Family/patient expects to be discharged to:: Skilled nursing facility                                 Additional Comments: per chart pt resides in SNF in [place]      Prior Functioning/Environment Level of Independence: Needs assistance        Comments: Unsure due to baseline cognitive deficits.         OT Problem List: Decreased strength;Decreased range of motion;Decreased activity tolerance;Impaired balance (sitting and/or standing);Decreased  cognition;Decreased coordination;Decreased safety awareness;Decreased knowledge of use of DME or AE;Decreased knowledge of precautions;Impaired UE functional use      OT Treatment/Interventions: Self-care/ADL training;Therapeutic exercise;Energy conservation;DME and/or AE instruction;Therapeutic activities;Patient/family education;Balance training    OT Goals(Current goals can be found in the care plan section) Acute Rehab OT Goals Patient Stated Goal: Unstated OT Goal Formulation: Patient unable to participate in goal setting Time For Goal Achievement: 04/19/19 Potential to Achieve Goals: Good  OT Frequency: Min 2X/week   Barriers to D/C:            Co-evaluation              AM-PAC OT "6 Clicks" Daily Activity     Outcome Measure Help from another person eating meals?: Total Help from another person taking care of personal grooming?: A Lot Help from another person toileting, which includes using toliet, bedpan, or urinal?: A Lot Help from another person bathing (including washing, rinsing, drying)?: A Lot Help from another person to put on and  taking off regular upper body clothing?: A Lot Help from another person to put on and taking off regular lower body clothing?: Total 6 Click Score: 10   End of Session Equipment Utilized During Treatment: Oxygen(1L) Nurse Communication: Mobility status  Activity Tolerance: Patient tolerated treatment well Patient left: in bed;with call bell/phone within reach;with bed alarm set;with SCD's reapplied  OT Visit Diagnosis: Unsteadiness on feet (R26.81);Other abnormalities of gait and mobility (R26.89);Muscle weakness (generalized) (M62.81);Other symptoms and signs involving cognitive function;Hemiplegia and hemiparesis Hemiplegia - Right/Left: Left Hemiplegia - dominant/non-dominant: (Unsure) Hemiplegia - caused by: Cerebral infarction                Time: 3567-0141 OT Time Calculation (min): 29 min Charges:  OT General Charges $OT  Visit: 1 Visit OT Evaluation $OT Eval Moderate Complexity: 1 Mod OT Treatments $Self Care/Home Management : 8-22 mins  Reyli Schroth MSOT, OTR/L Acute Rehab Pager: [phone] Office: [phone]   Theodoro Grist Quinta Eimer 04/05/2019, 3:31 PM

## 2019-04-06 LAB — CBC
HCT: 35.9 % — ABNORMAL LOW (ref 39.0–52.0)
Hemoglobin: 11.2 g/dL — ABNORMAL LOW (ref 13.0–17.0)
MCH: 26.4 pg (ref 26.0–34.0)
MCHC: 31.2 g/dL (ref 30.0–36.0)
MCV: 84.7 fL (ref 80.0–100.0)
Platelets: 301 10*3/uL (ref 150–400)
RBC: 4.24 MIL/uL (ref 4.22–5.81)
RDW: 16 % — ABNORMAL HIGH (ref 11.5–15.5)
WBC: 7.6 10*3/uL (ref 4.0–10.5)
nRBC: 0 % (ref 0.0–0.2)

## 2019-04-06 LAB — BASIC METABOLIC PANEL
Anion gap: 8 (ref 5–15)
BUN: 69 mg/dL — ABNORMAL HIGH (ref 8–23)
CO2: 23 mmol/L (ref 22–32)
Calcium: 9 mg/dL (ref 8.9–10.3)
Chloride: 128 mmol/L — ABNORMAL HIGH (ref 98–111)
Creatinine, Ser: 4.26 mg/dL — ABNORMAL HIGH (ref 0.61–1.24)
GFR calc Af Amer: 16 mL/min — ABNORMAL LOW (ref >60)
GFR calc non Af Amer: 14 mL/min — ABNORMAL LOW (ref >60)
Glucose, Bld: 197 mg/dL — ABNORMAL HIGH (ref 70–99)
Potassium: 3.7 mmol/L (ref 3.5–5.1)
Sodium: 159 mmol/L — ABNORMAL HIGH (ref 135–145)

## 2019-04-06 LAB — RENAL FUNCTION PANEL
Albumin: 2 g/dL — ABNORMAL LOW (ref 3.5–5.0)
Anion gap: 11 (ref 5–15)
BUN: 69 mg/dL — ABNORMAL HIGH (ref 8–23)
CO2: 21 mmol/L — ABNORMAL LOW (ref 22–32)
Calcium: 8.5 mg/dL — ABNORMAL LOW (ref 8.9–10.3)
Chloride: 124 mmol/L — ABNORMAL HIGH (ref 98–111)
Creatinine, Ser: 4.05 mg/dL — ABNORMAL HIGH (ref 0.61–1.24)
GFR calc Af Amer: 17 mL/min — ABNORMAL LOW (ref >60)
GFR calc non Af Amer: 15 mL/min — ABNORMAL LOW (ref >60)
Glucose, Bld: 149 mg/dL — ABNORMAL HIGH (ref 70–99)
Phosphorus: 3.4 mg/dL (ref 2.5–4.6)
Potassium: 3.5 mmol/L (ref 3.5–5.1)
Sodium: 156 mmol/L — ABNORMAL HIGH (ref 135–145)

## 2019-04-06 LAB — GLUCOSE, CAPILLARY
Glucose-Capillary: 172 mg/dL — ABNORMAL HIGH (ref 70–99)
Glucose-Capillary: 133 mg/dL — ABNORMAL HIGH (ref 70–99)
Glucose-Capillary: 168 mg/dL — ABNORMAL HIGH (ref 70–99)
Glucose-Capillary: 172 mg/dL — ABNORMAL HIGH (ref 70–99)
Glucose-Capillary: 142 mg/dL — ABNORMAL HIGH (ref 70–99)

## 2019-04-06 LAB — VANCOMYCIN, RANDOM: Vancomycin Rm: 21

## 2019-04-06 MED ORDER — FREE WATER
400.0000 mL | Status: DC
  Administered 2019-04-06 – 2019-04-08 (×13): 400 mL

## 2019-04-06 MED ORDER — SODIUM CHLORIDE 0.45 % IV SOLN
INTRAVENOUS | Status: DC
  Administered 2019-04-06 – 2019-04-07 (×4): via INTRAVENOUS

## 2019-04-06 NOTE — Progress Notes (Signed)
Nutrition Follow-up  DOCUMENTATION CODES:   Not applicable  INTERVENTION:  Continue Vital 1.5 formula via NGT at goal rate of 50 ml/hr with 30 ml Prostat TID.    Free water flushes of 400 ml every 4 hours per MD.   Tube feeding regimen provides 2100 kcal (100% of needs), 126 grams of protein, and 3312 ml free water.   NUTRITION DIAGNOSIS:   Inadequate oral intake related to inability to eat as evidenced by NPO status; ongoing  GOAL:   Patient will meet greater than or equal to 90% of their needs; met with TD  MONITOR:   Vent status, Labs, Weight trends, TF tolerance, I & O's  REASON FOR ASSESSMENT:   Ventilator, Consult Enteral/tube feeding initiation and management  ASSESSMENT:   63 year old male who presented on 9/02 from [place] (admitted 8/20) with fevers suspected to be related to sepsis secondary to pneumonia and cystitis. Pt was intubated 8/21 and failed extubation after gross aspiration and status epilepticus which prompted transfer to [facility]. PMH of HTN, HLD, left CVA, DM, hepatitis C, schizophrenia, dementia. Pt with MRSA PNA. Extubated 9/6.   Per MD, pt remains altered likely secondary to prolonged seizure episodes. Pt with history of starting HD 8/24 on TTS schedule. Pt with permanent cath on right chest. HD currently on hold. Pt continues NPO status. Tube feeds continue to infuse. RD to continue with current orders and monitor for tolerance.   Labs and medications reviewed. Sodium elevated at 159. IV fluids and free water flushes ordered.   Diet Order:   Diet Order            Diet NPO time specified  Diet effective now              EDUCATION NEEDS:   No education needs have been identified at this time  Skin:  Skin Assessment: Reviewed RN Assessment  Last BM:  9/7  Height:   Ht Readings from Last 1 Encounters:  03/31/19 6' (1.829 m)    Weight:   Wt Readings from Last 1 Encounters:  04/06/19 84.4 kg    Ideal Body Weight:  80.9  kg  BMI:  Body mass index is 25.24 kg/m.  Estimated Nutritional Needs:   Kcal:  2100-2300  Protein:  110-125 grams  Fluid:  >/= 2  L/day    Corrin Parker, MS, RD, LDN Pager # [phone] After hours/ weekend pager # [phone]

## 2019-04-06 NOTE — Progress Notes (Signed)
Pharmacy Antibiotic Note  Chad Mcclure is a 63 y.o. male admitted on 03/31/2019 with pneumonia.  [name] cultures are growing moderate staph aureus - the sensitivities should return tomorrow. Pt is AKI and received HD - TuThSat at outside hospital.  Patient showing some signs of renal recovery, need for dialysis being assessed daily.  Random vanc level yesterday 9/5 - 20 mg/dl - per nephrology no dialysis today. Random vanc level this am 9/6 - 16 mg/dl - 1500mg  IV Vanc given.  Random vanc level this AM 9/8 - 21 mg/dl, no HD today.   Plan: No further Vancomycin today  Will continue to monitor renal function and dialysis need and draw vanc levels as appropriate.   Height: 6' (182.9 cm) Weight: 186 lb 1.1 oz (84.4 kg) IBW/kg (Calculated) : 77.6  Temp (24hrs), Avg:98.2 F (36.8 C), Min:97.4 F (36.3 C), Max:98.7 F (37.1 C)  Recent Labs  Lab 03/31/19 2317 04/01/19 0228 04/02/19 0634 04/03/19 0521  04/04/19 0452 04/05/19 0947 04/06/19 0536  WBC 10.9* 10.9*  --  7.2  --   --   --  7.6  CREATININE 5.96* 6.02* 6.39* 5.92*  --  5.17* 4.31* 4.26*  VANCORANDOM  --   --   --   --    < > 16  --  21   < > = values in this interval not displayed.    Estimated Creatinine Clearance: 19.5 mL/min (A) (by C-G formula based on SCr of 4.26 mg/dL (H)).    Per [name] Sputem - MRSA  9/3 Sputem here also MRSA  Sloan Leiter, PharmD, BCPS, [place] Clinical Pharmacist Clinical phone 04/06/2019 until 3:[phone] Please refer to [facility] for [place] numbers 04/06/2019, 12:50 PM

## 2019-04-06 NOTE — Progress Notes (Signed)
Neurology Progress Note   S:// Seen and examined No acute events overnight  O:// Current vital signs: BP (!) 143/95 (BP Location: Left Arm)   Pulse (!) 116   Temp (!) 97.4 F (36.3 C)   Resp 18   Ht 6' (1.829 m)   Wt 84.4 kg   SpO2 91%   BMI 25.24 kg/m  Vital signs in last 24 hours: Temp:  [97.4 F (36.3 C)-98.7 F (37.1 C)] 97.4 F (36.3 C) (09/08 0808) Pulse Rate:  [93-116] 116 (09/08 0808) Resp:  [18-27] 18 (09/08 0808) BP: (141-151)/(90-98) 143/95 (09/08 0808) SpO2:  [90 %-93 %] 91 % (09/08 0808) Weight:  [84.4 kg] 84.4 kg (09/08 0500) Neurological exam Awake, no distress, follows commands on the right.  Was able to mouth some words-example when asked to tell me how many fingers on the right, he was able to mouth right number. Cranial nerves: Mild pupillary asymmetry with reactive pupils bilaterally with right pupil slightly larger than the left.  Disconjugate gaze with right exotropia.  Possible left facial weakness. Motor exam: Contractures left hemiparesis that is chronic per report.  Moves right upper and lower extremity to command-upper extremities antigravity, lower extremity is barely 3/5 but is able to wiggle toes to command. Sensory exam: Intact to noxious stimulation in all fours DTRs brisk on the left.  Medications  Current Facility-Administered Medications:  .  0.9 %  sodium chloride infusion, , Intravenous, PRN, Renee Pain, MD, Stopped at 04/02/19 0029 .  0.9 %  sodium chloride infusion, , Intravenous, PRN, Deterding, Guadelupe Sabin, MD, Last Rate: 10 mL/hr at 04/04/19 2000 .  acetaminophen (TYLENOL) solution 500 mg, 500 mg, Per Tube, Q6H PRN, Margaretha Seeds, MD, 500 mg at 04/04/19 1650 .  amLODipine (NORVASC) tablet 10 mg, 10 mg, Per Tube, Daily, Elsie Lincoln, MD, 10 mg at 04/05/19 0912 .  chlorhexidine gluconate (MEDLINE KIT) (PERIDEX) 0.12 % solution 15 mL, 15 mL, Mouth Rinse, BID, Desai, Rahul P, PA-C, 15 mL at 04/05/19 1958 .  Chlorhexidine  Gluconate Cloth 2 % PADS 6 each, 6 each, Topical, Daily, Shearon Stalls, Rahul P, PA-C, 6 each at 04/05/19 2203 .  feeding supplement (PRO-STAT SUGAR FREE 64) liquid 30 mL, 30 mL, Per Tube, TID, Chesley Mires, MD, 30 mL at 04/05/19 2204 .  feeding supplement (VITAL 1.5 CAL) liquid 1,000 mL, 1,000 mL, Per Tube, Continuous, Sood, Vineet, MD, Last Rate: 50 mL/hr at 04/05/19 1607, 1,000 mL at 04/05/19 1607 .  fentaNYL (SUBLIMAZE) injection 50 mcg, 50 mcg, Intravenous, Q15 min PRN, Desai, Rahul P, PA-C, 50 mcg at 03/31/19 2230 .  fentaNYL (SUBLIMAZE) injection 50-200 mcg, 50-200 mcg, Intravenous, Q30 min PRN, Desai, Rahul P, PA-C, 100 mcg at 04/04/19 0334 .  free water 400 mL, 400 mL, Per Tube, Q8H, Rosita Fire, MD, 400 mL at 04/06/19 0550 .  heparin injection 5,000 Units, 5,000 Units, Subcutaneous, Q8H, Desai, Rahul P, PA-C, 5,000 Units at 04/06/19 0550 .  hydrALAZINE (APRESOLINE) injection 10 mg, 10 mg, Intravenous, Q4H PRN, Margaretha Seeds, MD, 10 mg at 04/04/19 0921 .  insulin aspart (novoLOG) injection 0-9 Units, 0-9 Units, Subcutaneous, Q4H, Desai, Rahul P, PA-C, 2 Units at 04/06/19 0545 .  lacosamide (VIMPAT) 100 mg in sodium chloride 0.9 % 25 mL IVPB, 100 mg, Intravenous, Q12H, Desai, Rahul P, PA-C, Last Rate: 70 mL/hr at 04/05/19 2202, 100 mg at 04/05/19 2202 .  levETIRAcetam (KEPPRA) IVPB 500 mg/100 mL premix, 500 mg, Intravenous, Q12H, Desai, Rahul P, PA-C, Last  Rate: 400 mL/hr at 04/05/19 2334, 500 mg at 04/05/19 2334 .  LORazepam (ATIVAN) injection 1-4 mg, 1-4 mg, Intravenous, Q2H PRN, Desai, Rahul P, PA-C, 2 mg at 04/03/19 0500 .  MEDLINE mouth rinse, 15 mL, Mouth Rinse, 10 times per day, Desai, Rahul P, PA-C, 15 mL at 04/06/19 0549 .  pantoprazole sodium (PROTONIX) 40 mg/20 mL oral suspension 40 mg, 40 mg, Per Tube, Daily, Agarwala, Ravi, MD, 40 mg at 04/05/19 0912 .  sodium chloride flush (NS) 0.9 % injection 10-40 mL, 10-40 mL, Intracatheter, PRN, Sood, Vineet, MD .  vancomycin variable  dose per unstable renal function (pharmacist dosing), , Does not apply, See admin instructions, Rush Farmer, MD Labs CBC    Component Value Date/Time   WBC 7.6 04/06/2019 0536   RBC 4.24 04/06/2019 0536   HGB 11.2 (L) 04/06/2019 0536   HCT 35.9 (L) 04/06/2019 0536   PLT 301 04/06/2019 0536   MCV 84.7 04/06/2019 0536   MCH 26.4 04/06/2019 0536   MCHC 31.2 04/06/2019 0536   RDW 16.0 (H) 04/06/2019 0536   LYMPHSABS 3.2 12/18/2010 0400   MONOABS 0.8 12/18/2010 0400   EOSABS 0.0 12/18/2010 0400   BASOSABS 0.0 12/18/2010 0400    CMP     Component Value Date/Time   NA 159 (H) 04/06/2019 0536   K 3.7 04/06/2019 0536   CL 128 (H) 04/06/2019 0536   CO2 23 04/06/2019 0536   GLUCOSE 197 (H) 04/06/2019 0536   BUN 69 (H) 04/06/2019 0536   CREATININE 4.26 (H) 04/06/2019 0536   CALCIUM 9.0 04/06/2019 0536   PROT 6.7 03/31/2019 2317   ALBUMIN 1.9 (L) 04/04/2019 0452   AST 63 (H) 03/31/2019 2317   ALT 81 (H) 03/31/2019 2317   ALKPHOS 52 03/31/2019 2317   BILITOT 0.8 03/31/2019 2317   GFRNONAA 14 (L) 04/06/2019 0536   GFRAA 16 (L) 04/06/2019 0536    Imaging I have reviewed images in epic and the results pertinent to this consultation are: No new brain imaging to review. MRI brain and MRA brain from before reviewed.  Assessment: 63 year old man with persistent encephalopathy status post seizure and possible respiratory failure due to aspiration pneumonia transferred from outside hospital for higher level of care. EEG on 04/01/2019 with the severe encephalopathy and no seizures. Likely prolonged encephalopathy from prolonged seizure/status epilepticus. Family unaware of much of medical history including history of schizophrenia and possibly underlying dementia at baseline. Other possibilities include multifactorial toxic metabolic encephalopathy. Examination improving but slowly.   Impression: #Encephalopathy-improving #Etiology of encephalopathy likely secondary to prolonged  seizure versus medication versus other multifactorial toxic metabolic etiologies. #Chronic left hemiparesis  Recommendations: Continue Keppra 500 twice daily Continue Vimpat 100 twice daily In terms of his mentation, expect to see slow improvement with time. Supportive care per primary team. PT OT  We will follow with you to continue clinical exam and any changes to antibiotics if needed. Please call with questions.  -- Amie Portland, MD [facility] Pager: [phone] If 7pm to 7am, please call on call as listed on AMION.

## 2019-04-06 NOTE — Progress Notes (Signed)
Admit: 03/31/2019 LOS: 6  52M with AKI, req HD, now on hold, AMS  Subjective:  . Serum creatinine improved to 4.3, sodium 159 on acute 4-hour 0.4 L free water flushes  . Not on IV fluids . 1.7 L urine output yesterday . Free water flushes 400 mL every 8h . Has right IJ tunneled HD catheter   09/07 0701 - 09/08 0700 In: 735 [NG/GT:700; IV Piggyback:35] Out: 2202 [Urine:1650]  Filed Weights   04/02/19 0500 04/04/19 0500 04/06/19 0500  Weight: 88.3 kg 85.3 kg 84.4 kg    Scheduled Meds: . amLODipine  10 mg Per Tube Daily  . chlorhexidine gluconate (MEDLINE KIT)  15 mL Mouth Rinse BID  . Chlorhexidine Gluconate Cloth  6 each Topical Daily  . feeding supplement (PRO-STAT SUGAR FREE 64)  30 mL Per Tube TID  . free water  400 mL Per Tube Q8H  . heparin  5,000 Units Subcutaneous Q8H  . insulin aspart  0-9 Units Subcutaneous Q4H  . mouth rinse  15 mL Mouth Rinse 10 times per day  . pantoprazole sodium  40 mg Per Tube Daily  . vancomycin variable dose per unstable renal function (pharmacist dosing)   Does not apply See admin instructions   Continuous Infusions: . sodium chloride    . sodium chloride Stopped (04/02/19 0029)  . sodium chloride 10 mL/hr at 04/04/19 2000  . feeding supplement (VITAL 1.5 CAL) 1,000 mL (04/05/19 1607)  . lacosamide (VIMPAT) IV 100 mg (04/05/19 2202)  . levETIRAcetam 500 mg (04/05/19 2334)   PRN Meds:.sodium chloride, sodium chloride, acetaminophen (TYLENOL) oral liquid 160 mg/5 mL, fentaNYL (SUBLIMAZE) injection, fentaNYL (SUBLIMAZE) injection, hydrALAZINE, LORazepam, sodium chloride flush  Current Labs: reviewed  Physical Exam:  Blood pressure (!) 143/95, pulse (!) 116, temperature (!) 97.4 F (36.3 C), resp. rate 18, height 6' (1.829 m), weight 84.4 kg, SpO2 91 %. Chronically ill-appearing, encephalopathic, not interactive Tachycardic, regular, normal S1 and S2 Coarse breath sounds bilaterally, normal work of breathing Soft, nontender  A 1. AKI,  previously required dialysis, etiology contrast nephropathy, improving excellent UOP and downtrending creatinine consistent with recovery 2. Hypernatremia, likely solute diuresis, worsened 3. Encephalopathy, per neurology and CCM; likley has a chronic component 4. Acute hypoxic respiratory failure, self extubation 9/5, stable on nasal cannula 5. Hypertension, on amlodipine, stable 6. SNF resident 7. Schizophrenia 8. Chart history of dementia 9. History of left CVA  P  . Inc FWF to 447m q4h, start 1/2 NS @ 125mh . BMP this PM . Medication Issues; o Preferred narcotic agents for pain control are hydromorphone, fentanyl, and methadone. Morphine should not be used.  o Baclofen should be avoided o Avoid oral sodium phosphate and magnesium citrate based laxatives / bowel preps    RyPearson GrippeD 04/06/2019, 9:52 AM  Recent Labs  Lab 04/02/19 1611  04/03/19 0521  04/04/19 0452 04/05/19 0947 04/06/19 0536  NA  --    < > 151*   < > 155* 159* 159*  K  --    < > 3.6   < > 3.8 3.6 3.7  CL  --   --  117*  --  122* 126* 128*  CO2  --   --  21*  --  24 22 23   GLUCOSE  --   --  135*  --  130* 183* 197*  BUN  --   --  65*  --  64* 64* 69*  CREATININE  --   --  5.92*  --  5.17* 4.31* 4.26*  CALCIUM  --   --  8.3*  --  8.3* 8.8* 9.0  PHOS 4.6  --  4.6  --  4.7*  --   --    < > = values in this interval not displayed.   Recent Labs  Lab 04/01/19 0228  04/03/19 0521 04/04/19 0435 04/06/19 0536  WBC 10.9*  --  7.2  --  7.6  HGB 9.6*   < > 9.2* 9.2* 11.2*  HCT 28.1*   < > 28.3* 27.0* 35.9*  MCV 79.8*  --  83.5  --  84.7  PLT 315  --  316  --  301   < > = values in this interval not displayed.

## 2019-04-06 NOTE — Progress Notes (Signed)
PROGRESS NOTE    LI FRAGOSO  XFG:[id number] DOB: 23-Jun-1956 DOA: 03/31/2019 PCP: Patient, No Pcp Per   Brief Narrative:  Patient is a 63 year old male with H/O HTN, HLD, HCV, Left CVA, DM, schizophrenia, dementia and who resides at SNF in [place] , who initially was admitted to [facility] on 8/20 with fevers.  He was suspected to have pneumonia and cystitis.  He required intubation on 8/21 for acute hypoxic respiratory failure.  Extubated on 8/29 did not require intubation on 8/31 for status epilepticus. He required initiation of hemodialysis after failing a lasix challenge, TTS schedule planned (R subclavian perm cath placed). He was transferred to [facility] on 9/2 and was under PCCM service.  He has been extubated now and transferred to hospitalist team on 04/06/2019.  He is being managed for MRSA associated pneumonia, seizures.  Hospital course remarkable for persistent encephalopathy.  Nephrology following for dialysis.  Neurology also following.  Assessment & Plan:   Principal Problem:   Acute respiratory failure with hypoxia (HCC) Active Problems:   Status epilepticus ([place])   AKI (acute kidney injury) ([place])   Encephalopathy acute   Aspiration pneumonia (HCC)   Acute hypoxic respiratory failure: Currently  being treated for MRSA pneumonia.  Had to be intubated twice.  Self extubated on 9/5.  Currently respiratory status stable on nasal cannula.  On 5 L of oxygen per minute. Continue pulmonary toileting.  MRSA pneumonia: Day 5/7 of vancomycin.  Continue to complete the course.  Encephalopathy: Secondary to seizures, ICU delirium, sedating medicines.  Neurology following.  Continues to remain altered.  EEG on 04/01/2019 showed severe encephalopathy but no seizures.  Prolonged encephalopathy most likely secondary to prolonged seizure episodes/status epilepticus.  Seizure disorder: On Vimpat, Keppra.  Neurology following.  AKI suspected secondary to contrast nephropathy:  Nephrology following.  Started on dialysis on 8/24 at [facility].  TTS schedule.  He is having good urine output.  Expecting renal recovery.  Nephrology following.  He has a permanent cath on the right chest.HD on hold.  Hypernatremia: Continue free water and half-normal saline  Hypertension: Continue blood pressure medicines.  Continue to monitor blood pressure  Dysphagia/protein calorie malnutrition: Dietitian following.  Currently on tube feeding.  Will request for speech therapy evaluation to evaluate for swallowing ability.  Diabetes mellitus: Continue sliding scale insulin.  History of schizophrenia/dementia: Continue supportive care.  History of left CVA: Has residual weakness on the left side.  Skilled nursing facility resident.He had slurred speech on baseline.  Debility/deconditioning: PT/OT consulted and recommended skilled nursing facility.  Social worker made aware.  When he is medically ready for discharge, he will be discharged back to skilled nursing facility at [facility].  Nutrition Problem: Inadequate oral intake Etiology: inability to eat      DVT prophylaxis: Heparin [place] Code Status: Full Family Communication: Called and talked to daughter and father on phone.Daughter makes the decisions Disposition Plan: Back to skilled nursing facility at [facility] when medically stable   Consultants: PCCM, neurology, nephrology  Procedures: Intubation, extubation, dialysis catheter placement  Antimicrobials:  Anti-infectives (From admission, onward)   Start     Dose/Rate Route Frequency Ordered Stop   04/04/19 0830  vancomycin (VANCOCIN) 1,500 mg in sodium chloride 0.9 % 500 mL IVPB     1,500 mg 250 mL/hr over 120 Minutes Intravenous  Once 04/04/19 0810 04/04/19 1127   04/03/19 1200  vancomycin (VANCOCIN) IVPB 1000 mg/200 mL premix  Status:  Discontinued     1,000 mg 200  mL/hr over 60 Minutes Intravenous Every T-Th-Sa (Hemodialysis) 04/01/19 1253 04/03/19 0744   04/03/19  0746  vancomycin variable dose per unstable renal function (pharmacist dosing)      Does not apply See admin instructions 04/03/19 0746     04/01/19 1400  vancomycin (VANCOCIN) 2,000 mg in sodium chloride 0.9 % 500 mL IVPB     2,000 mg 250 mL/hr over 120 Minutes Intravenous  Once 04/01/19 1253 04/01/19 1900   04/01/19 0800  meropenem (MERREM) 500 mg in sodium chloride 0.9 % 100 mL IVPB  Status:  Discontinued     500 mg 200 mL/hr over 30 Minutes Intravenous Every 24 hours 04/01/19 0059 04/03/19 0757   03/31/19 2315  piperacillin-tazobactam (ZOSYN) IVPB 3.375 g     3.375 g 100 mL/hr over 30 Minutes Intravenous  Once 03/31/19 2300 04/01/19 0031      Subjective:  Patient seen and examined the bedside this morning.  Hemodynamically stable.  Not in distress.  Continues to remain altered.  There might be some improvement in the mental status but he does not follow with commands and does not participate in communication.  Objective: Vitals:   04/06/19 0800 04/06/19 0808 04/06/19 1225 04/06/19 1227  BP: (!) 151/98 (!) 143/95 140/82 (!) 143/93  Pulse: (!) 109 (!) 116 85 (!) 115  Resp: (!) _0 Temp:  (!) 97.4 F (36.3 C) 98.2 F (36.8 C) 98 F (36.7 C)  TempSrc:   Oral   SpO2: 90% 91% 98% 99%  Weight:      Height:        Intake/Output Summary (Last 24 hours) at 04/06/2019 1438 Last data filed at 04/06/2019 1200 Gross per 24 hour  Intake 735 ml  Output 2125 ml  Net -1390 ml   Filed Weights   04/02/19 0500 04/04/19 0500 04/06/19 0500  Weight: 88.3 kg 85.3 kg 84.4 kg    Examination:  General exam: Not in distress, extremely deconditioned/debilitated HEENT:Oral mucosa moist, Ear/Nose normal on gross exam, feeding tube Respiratory system: Bilateral equal air entry, normal vesicular breath sounds, no wheezes or crackles  Cardiovascular system: S1 & S2 heard, RRR. No JVD, murmurs, rubs, gallops or clicks. No pedal edema. Gastrointestinal system: Abdomen is nondistended, soft  and nontender. No organomegaly or masses felt. Normal bowel sounds heard. Central nervous system: Not alert or oriented.  Left residual hemiparesis with contractures .  Opens eyes to voice. extremities: No edema, no clubbing ,no cyanosis, distal peripheral pulses palpable. Skin: No rashes, lesions or ulcers,no icterus ,no pallor     Data Reviewed: I have personally reviewed following labs and imaging studies  CBC: Recent Labs  Lab 03/31/19 2317 04/01/19 0228  04/01/19 2129 04/03/19 0511 04/03/19 0521 04/04/19 0435 04/06/19 0536  WBC 10.9* 10.9*  --   --   --  7.2  --  7.6  HGB 9.4* 9.6*   < > 8.5* 9.9* 9.2* 9.2* 11.2*  HCT 27.5* 28.1*   < > 25.0* 29.0* 28.3* 27.0* 35.9*  MCV 79.9* 79.8*  --   --   --  83.5  --  84.7  PLT 312 315  --   --   --  316  --  301   < > = values in this interval not displayed.   Basic Metabolic Panel: Recent Labs  Lab 04/01/19 1135  04/01/19 2316 04/02/19 4782 04/02/19 1611  04/03/19 0521 04/04/19 0435 04/04/19 0452 04/05/19 0947 04/06/19 0536  NA  --    < >  --  147*  --    < > 151* 158* 155* 159* 159*  K  --    < >  --  3.5  --    < > 3.6 3.9 3.8 3.6 3.7  CL  --   --   --  113*  --   --  117*  --  122* 126* 128*  CO2  --   --   --  22  --   --  21*  --  _0 GLUCOSE  --   --   --  95  --   --  135*  --  130* 183* 197*  BUN  --   --   --  58*  --   --  65*  --  64* 64* 69*  CREATININE  --   --   --  6.39*  --   --  5.92*  --  5.17* 4.31* 4.26*  CALCIUM  --   --   --  8.3*  --   --  8.3*  --  8.3* 8.8* 9.0  MG 2.1  --  2.3 2.3 2.3  --  2.1  --   --   --   --   PHOS 4.4  --  3.9 4.8* 4.6  --  4.6  --  4.7*  --   --    < > = values in this interval not displayed.   GFR: Estimated Creatinine Clearance: 19.5 mL/min (A) (by C-G formula based on SCr of 4.26 mg/dL (H)). Liver Function Tests: Recent Labs  Lab 03/31/19 2317 04/02/19 0634 04/04/19 0452  AST 63*  --   --   ALT 81*  --   --   ALKPHOS 52  --   --   BILITOT 0.8  --   --    PROT 6.7  --   --   ALBUMIN 1.9* 1.9* 1.9*   No results for input(s): LIPASE, AMYLASE in the last 168 hours. Recent Labs  Lab 04/02/19 2030  AMMONIA 17   Coagulation Profile: No results for input(s): INR, PROTIME in the last 168 hours. Cardiac Enzymes: No results for input(s): CKTOTAL, CKMB, CKMBINDEX, TROPONINI in the last 168 hours. BNP (last 3 results) No results for input(s): PROBNP in the last 8760 hours. HbA1C: No results for input(s): HGBA1C in the last 72 hours. CBG: Recent Labs  Lab 04/05/19 1949 04/05/19 2315 04/06/19 0527 04/06/19 0810 04/06/19 1224  GLUCAP 160* 154* 172* 133* 168*   Lipid Profile: No results for input(s): CHOL, HDL, LDLCALC, TRIG, CHOLHDL, LDLDIRECT in the last 72 hours. Thyroid Function Tests: No results for input(s): TSH, T4TOTAL, FREET4, T3FREE, THYROIDAB in the last 72 hours. Anemia Panel: No results for input(s): VITAMINB12, FOLATE, FERRITIN, TIBC, IRON, RETICCTPCT in the last 72 hours. Sepsis Labs: No results for input(s): PROCALCITON, LATICACIDVEN in the last 168 hours.  Recent Results (from the past 240 hour(s))  MRSA PCR Screening     Status: Abnormal   Collection Time: 03/31/19  9:42 PM   Specimen: Nasal Mucosa; Nasopharyngeal  Result Value Ref Range Status   MRSA by PCR POSITIVE (A) NEGATIVE Final    Comment:        The GeneXpert MRSA Assay (FDA approved for NASAL specimens only), is one component of a comprehensive MRSA colonization surveillance program. It is not intended to diagnose MRSA infection nor to guide or monitor treatment for MRSA infections. RESULT CALLED TO, READ BACK BY AND VERIFIED WITH: M  PLUMMER RN [id number] 03/31/19 A BROWNING Performed at [facility], [place] [address] [place], [place] [address]   Culture, respiratory (non-expectorated)     Status: None   Collection Time: 04/01/19 11:30 AM   Specimen: Tracheal Aspirate; Respiratory  Result Value Ref Range Status   Specimen Description TRACHEAL  ASPIRATE  Final   Special Requests NONE  Final   Gram Stain   Final    FEW WBC PRESENT, PREDOMINANTLY PMN MODERATE GRAM POSITIVE COCCI IN PAIRS IN CLUSTERS Performed at [facility], [address] [place], [place] [address]    Culture   Final    ABUNDANT METHICILLIN RESISTANT STAPHYLOCOCCUS AUREUS   Report Status 04/03/2019 FINAL  Final   Organism ID, Bacteria METHICILLIN RESISTANT STAPHYLOCOCCUS AUREUS  Final      Susceptibility   Methicillin resistant staphylococcus aureus - MIC*    CIPROFLOXACIN >=8 RESISTANT Resistant     ERYTHROMYCIN >=8 RESISTANT Resistant     GENTAMICIN <=0.5 SENSITIVE Sensitive     OXACILLIN >=4 RESISTANT Resistant     TETRACYCLINE 2 SENSITIVE Sensitive     VANCOMYCIN 1 SENSITIVE Sensitive     TRIMETH/SULFA <=10 SENSITIVE Sensitive     CLINDAMYCIN >=8 RESISTANT Resistant     RIFAMPIN <=0.5 SENSITIVE Sensitive     Inducible Clindamycin NEGATIVE Sensitive     * ABUNDANT METHICILLIN RESISTANT STAPHYLOCOCCUS AUREUS         Radiology Studies: No results found.      Scheduled Meds: . amLODipine  10 mg Per Tube Daily  . chlorhexidine gluconate (MEDLINE KIT)  15 mL Mouth Rinse BID  . Chlorhexidine Gluconate Cloth  6 each Topical Daily  . feeding supplement (PRO-STAT SUGAR FREE 64)  30 mL Per Tube TID  . free water  400 mL Per Tube Q4H  . heparin  5,000 Units Subcutaneous Q8H  . insulin aspart  0-9 Units Subcutaneous Q4H  . mouth rinse  15 mL Mouth Rinse 10 times per day  . pantoprazole sodium  40 mg Per Tube Daily  . vancomycin variable dose per unstable renal function (pharmacist dosing)   Does not apply See admin instructions   Continuous Infusions: . sodium chloride 125 mL/hr at 04/06/19 1008  . sodium chloride Stopped (04/02/19 0029)  . sodium chloride 10 mL/hr at 04/04/19 2000  . feeding supplement (VITAL 1.5 CAL) 1,000 mL (04/06/19 1025)  . lacosamide (VIMPAT) IV 100 mg (04/06/19 1006)  . levETIRAcetam 500 mg (04/06/19 1200)      LOS: 6 days    Time spent: 35 mins.More than 50% of that time was spent in counseling and/or coordination of care.      Shelly Coss, MD [facility] Pager [phone]  If 7PM-7AM, please contact night-coverage [email or web address] Password [facility] 04/06/2019, 2:38 PM

## 2019-04-07 ENCOUNTER — Inpatient Hospital Stay (HOSPITAL_COMMUNITY): Payer: Medicaid Other

## 2019-04-07 ENCOUNTER — Encounter (HOSPITAL_COMMUNITY): Payer: Self-pay | Admitting: Student

## 2019-04-07 HISTORY — PX: IR REMOVAL TUN CV CATH W/O FL: IMG2289

## 2019-04-07 LAB — GLUCOSE, CAPILLARY
Glucose-Capillary: 140 mg/dL — ABNORMAL HIGH (ref 70–99)
Glucose-Capillary: 147 mg/dL — ABNORMAL HIGH (ref 70–99)
Glucose-Capillary: 149 mg/dL — ABNORMAL HIGH (ref 70–99)
Glucose-Capillary: 156 mg/dL — ABNORMAL HIGH (ref 70–99)
Glucose-Capillary: 159 mg/dL — ABNORMAL HIGH (ref 70–99)
Glucose-Capillary: 165 mg/dL — ABNORMAL HIGH (ref 70–99)

## 2019-04-07 LAB — BASIC METABOLIC PANEL
Anion gap: 9 (ref 5–15)
Anion gap: 9 (ref 5–15)
BUN: 63 mg/dL — ABNORMAL HIGH (ref 8–23)
BUN: 64 mg/dL — ABNORMAL HIGH (ref 8–23)
CO2: 23 mmol/L (ref 22–32)
CO2: 23 mmol/L (ref 22–32)
Calcium: 8.8 mg/dL — ABNORMAL LOW (ref 8.9–10.3)
Calcium: 8.4 mg/dL — ABNORMAL LOW (ref 8.9–10.3)
Chloride: 126 mmol/L — ABNORMAL HIGH (ref 98–111)
Chloride: 125 mmol/L — ABNORMAL HIGH (ref 98–111)
Creatinine, Ser: 3.76 mg/dL — ABNORMAL HIGH (ref 0.61–1.24)
Creatinine, Ser: 3.31 mg/dL — ABNORMAL HIGH (ref 0.61–1.24)
GFR calc Af Amer: 19 mL/min — ABNORMAL LOW (ref >60)
GFR calc Af Amer: 22 mL/min — ABNORMAL LOW (ref >60)
GFR calc non Af Amer: 16 mL/min — ABNORMAL LOW (ref >60)
GFR calc non Af Amer: 19 mL/min — ABNORMAL LOW (ref >60)
Glucose, Bld: 177 mg/dL — ABNORMAL HIGH (ref 70–99)
Glucose, Bld: 177 mg/dL — ABNORMAL HIGH (ref 70–99)
Potassium: 3.8 mmol/L (ref 3.5–5.1)
Potassium: 3.6 mmol/L (ref 3.5–5.1)
Sodium: 158 mmol/L — ABNORMAL HIGH (ref 135–145)
Sodium: 157 mmol/L — ABNORMAL HIGH (ref 135–145)

## 2019-04-07 LAB — VANCOMYCIN, RANDOM: Vancomycin Rm: 13

## 2019-04-07 MED ORDER — LIDOCAINE HCL 1 % IJ SOLN
INTRAMUSCULAR | Status: AC
  Filled 2019-04-07: qty 20

## 2019-04-07 MED ORDER — VANCOMYCIN HCL 500 MG IV SOLR
500.0000 mg | Freq: Once | INTRAVENOUS | Status: AC
  Administered 2019-04-07: 09:00:00 500 mg via INTRAVENOUS
  Filled 2019-04-07: qty 500

## 2019-04-07 MED ORDER — DEXTROSE 5 % IV SOLN
INTRAVENOUS | Status: DC
  Administered 2019-04-07 – 2019-04-08 (×3): via INTRAVENOUS

## 2019-04-07 MED ORDER — CHLORHEXIDINE GLUCONATE 4 % EX LIQD
CUTANEOUS | Status: AC
  Filled 2019-04-07: qty 15

## 2019-04-07 NOTE — Evaluation (Signed)
Clinical/Bedside Swallow Evaluation Patient Details  Name: Chad Mcclure MRN: [id number] Date of Birth: 1956-07-02  Today's Date: 04/07/2019 Time: SLP Start Time (ACUTE ONLY): [id number] SLP Stop Time (ACUTE ONLY): 0832 SLP Time Calculation (min) (ACUTE ONLY): 13 min  Past Medical History: No past medical history on file. Past Surgical History: The histories are not reviewed yet. Please review them in the "History" navigator section and refresh this SmartLink. HPI:  Chad Mcclure is a 63 y.o. male who has a PMH including but not limited to HTN, HLD, HCV, Left CVA (multiple infarcts involving cerebral white matter, thalami, and pons) DM, schizophrenia, dementia and who resides at [facility] in [place]. Admitted to [facility] on 03/18/19 with fever due PNA, required intubated on 8/21 for hypoxic respiratory failure, extubated 8/29 then required re-intubation 8/31 for staus epilepticus then [facility] and self extubated 9/5.    Assessment / Plan / Recommendation Clinical Impression  Pt presented lethargic with dried secretions connecting his lips. He followed simple commands inconsistently. Oral care necessary to remove lip peeling and significant phlegm residue in the oral cavity. Pt had a baseline congested cough and was unable to expell secretions, requiring ice chips to loosen and remove with suction. Vocal quality was a unvoiced prior to PO, and low intensity and wet after, with minimal articulation. Ice chips and half teaspoons of water were observed with an immediate wet cough and decrease in O2 sats (88-90) following each presentation. Pts decreased respiratory support causes him to take frequent inhalations which are followed by signs of aspiration. When asked to cough or clear secretions, unable to due to decreased respiratory support. Recommend continue NPO with alternative means with QID oral care due to severe aspiration risk. SLP follow up for upgraded texture trials to ensure  safest and least restrictive diet.  SLP Visit Diagnosis: Dysphagia, unspecified (R13.10)    Aspiration Risk  Severe aspiration risk    Diet Recommendation NPO   Medication Administration: Via alternative means    Other  Recommendations Oral Care Recommendations: Oral care QID   Follow up Recommendations Skilled Nursing facility(Simultaneous filing. User may not have seen previous data.)      Frequency and Duration min 2x/week  2 weeks       Prognosis Prognosis for Safe Diet Advancement: Good Barriers to Reach Goals: Cognitive deficits      Swallow Study   General Date of Onset: 03/31/19 HPI: Chad Mcclure is a 63 y.o. 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 Admitted to [facility] on 03/18/19 with fever due PNA, required intubated on 8/21 for hypoxic respiratory failure, extubated 8/29 then required re-intubation 8/31 for staus epilepticus then [facility] and self extubated 9/5.  Type of Study: Bedside Swallow Evaluation Previous Swallow Assessment: (none) Diet Prior to this Study: NPO Temperature Spikes Noted: No Respiratory Status: Nasal cannula History of Recent Intubation: Yes Length of Intubations (days): 14 days(intubation x 2) Date extubated: 04/03/19 Behavior/Cognition: Requires cueing;Lethargic/Drowsy Oral Cavity Assessment: Excessive secretions Oral Care Completed by SLP: Yes Oral Cavity - Dentition: Adequate natural dentition Vision: (TBA) Self-Feeding Abilities: Needs set up;Needs assist Patient Positioning: Upright in bed Baseline Vocal Quality: Breathy;Low vocal intensity Volitional Cough: Cognitively unable to elicit    Oral/Motor/Sensory Function Overall Oral Motor/Sensory Function: Mild impairment(generalized weakness) Facial Symmetry: Within Functional Limits Facial  Strength: Reduced right;Reduced left Lingual Strength: Reduced   Ice Chips Ice chips: Impaired Presentation: Spoon Pharyngeal Phase Impairments: Cough - Immediate;Change in Vital Signs(increased work of breathing)   Thin Liquid Thin Liquid: Impaired Presentation: Spoon Pharyngeal  Phase Impairments: Cough - Immediate;Wet Vocal Quality;Change in Vital Signs    Nectar Thick Nectar Thick Liquid: Not tested   Honey Thick Honey Thick Liquid: Not tested   Puree Puree: Not tested   Solid     Solid: Not tested      Jeslynn Hollander 04/07/2019,9:23 AM

## 2019-04-07 NOTE — Progress Notes (Signed)
Admit: 03/31/2019 LOS: 7  43M with AKI, req HD, now on hold, AMS  Subjective:  . Serum creatinine improved to 3.8, BUN 63, serum sodium is 158, minimal improvement compared to yesterday . Urine output was 1.9 L yesterday, only had 1.7L in?  Not sure all the enteral water was recorded? . Still with [facility]   09/08 0701 - 09/09 0700 In: 1856 [I.V.:1125; NG/GT:450; IV Piggyback:150] Out: 1925 [Urine:1925]  Filed Weights   04/04/19 0500 04/06/19 0500 04/07/19 0500  Weight: 85.3 kg 84.4 kg 84.4 kg    Scheduled Meds: . amLODipine  10 mg Per Tube Daily  . chlorhexidine gluconate (MEDLINE KIT)  15 mL Mouth Rinse BID  . Chlorhexidine Gluconate Cloth  6 each Topical Daily  . feeding supplement (PRO-STAT SUGAR FREE 64)  30 mL Per Tube TID  . free water  400 mL Per Tube Q4H  . heparin  5,000 Units Subcutaneous Q8H  . insulin aspart  0-9 Units Subcutaneous Q4H  . mouth rinse  15 mL Mouth Rinse 10 times per day  . pantoprazole sodium  40 mg Per Tube Daily  . vancomycin variable dose per unstable renal function (pharmacist dosing)   Does not apply See admin instructions   Continuous Infusions: . sodium chloride 125 mL/hr at 04/07/19 0343  . sodium chloride Stopped (04/02/19 0029)  . sodium chloride 10 mL/hr at 04/04/19 2000  . feeding supplement (VITAL 1.5 CAL) 1,000 mL (04/06/19 1025)  . lacosamide (VIMPAT) IV 100 mg (04/06/19 2143)  . levETIRAcetam 500 mg (04/06/19 2331)  . vancomycin 500 mg (04/07/19 0926)   PRN Meds:.sodium chloride, sodium chloride, acetaminophen (TYLENOL) oral liquid 160 mg/5 mL, fentaNYL (SUBLIMAZE) injection, fentaNYL (SUBLIMAZE) injection, hydrALAZINE, LORazepam, sodium chloride flush  Current Labs: reviewed  Physical Exam:  Blood pressure (!) 165/84, pulse (!) 106, temperature 98 F (36.7 C), temperature source Oral, resp. rate (!) 21, height 6' (1.829 m), weight 84.4 kg, SpO2 90 %. Chronically ill-appearing, encephalopathic, not interactive Tachycardic, regular,  normal S1 and S2 Coarse breath sounds bilaterally, normal work of breathing Soft, nontender  A 1. AKI, previously required dialysis, etiology contrast nephropathy, improving excellent UOP and downtrending creatinine consistent with recovery 2. Hypernatremia, likely solute diuresis, still with water deficit 3. Encephalopathy, per neurology and CCM; likley has a chronic component 4. Acute hypoxic respiratory failure, self extubation 9/5, stable on nasal cannula 5. Hypertension, on amlodipine, stable 6. SNF resident 7. Schizophrenia 8. Chart history of dementia 9. History of left CVA 10. Still has a tunneled dialysis catheter  P  . Cont FWF at 477m q4h, start 1/2 NS @ 1210mh . BMP early afternoon, titrate free water from there . Remove tunneled dialysis catheter . Medication Issues; o Preferred narcotic agents for pain control are hydromorphone, fentanyl, and methadone. Morphine should not be used.  o Baclofen should be avoided o Avoid oral sodium phosphate and magnesium citrate based laxatives / bowel preps    RyPearson GrippeD 04/07/2019, 10:21 AM  Recent Labs  Lab 04/03/19 05314909/06/20 0452  04/06/19 0536 04/06/19 2140 04/07/19 0520  NA 151*   < > 155*   < > 159* 156* 158*  K 3.6   < > 3.8   < > 3.7 3.5 3.8  CL 117*  --  122*   < > 128* 124* 126*  CO2 21*  --  24   < > 23 21* 23  GLUCOSE 135*  --  130*   < > 197* 149* 177*  BUN 65*  --  64*   < > 69* 69* 63*  CREATININE 5.92*  --  5.17*   < > 4.26* 4.05* 3.76*  CALCIUM 8.3*  --  8.3*   < > 9.0 8.5* 8.8*  PHOS 4.6  --  4.7*  --   --  3.4  --    < > = values in this interval not displayed.   Recent Labs  Lab 04/01/19 0228  04/03/19 0521 04/04/19 0435 04/06/19 0536  WBC 10.9*  --  7.2  --  7.6  HGB 9.6*   < > 9.2* 9.2* 11.2*  HCT 28.1*   < > 28.3* 27.0* 35.9*  MCV 79.8*  --  83.5  --  84.7  PLT 315  --  316  --  301   < > = values in this interval not displayed.

## 2019-04-07 NOTE — Progress Notes (Signed)
PROGRESS NOTE    Chad Mcclure  LDJ:[id number] DOB: Dec 09, 1955 DOA: 03/31/2019 PCP: Patient, No Pcp Per   Brief Narrative:  Patient is a 63 year old male with H/O HTN, HLD, HCV, Left CVA, DM, schizophrenia, dementia and who resides at SNF in [place] , who initially was admitted to [facility] on 8/20 with fevers.  He was suspected to have pneumonia and cystitis.  He required intubation on 8/21 for acute hypoxic respiratory failure.  Extubated on 8/29 did not require intubation on 8/31 for status epilepticus. He required initiation of hemodialysis after failing a lasix challenge, TTS schedule planned (R subclavian perm cath placed). He was transferred to [facility] on 9/2 and was under PCCM service.  He has been extubated now and transferred to hospitalist team on 04/06/2019.  He is being managed for MRSA associated pneumonia, seizures.  Hospital course remarkable for persistent encephalopathy.  Nephrology following for dialysis.  Neurology also following.  Assessment & Plan:   Principal Problem:   Acute respiratory failure with hypoxia (HCC) Active Problems:   Status epilepticus ([place])   AKI (acute kidney injury) ([place])   Encephalopathy acute   Aspiration pneumonia (HCC)   Acute hypoxic respiratory failure: Currently  being treated for MRSA pneumonia.  Had to be intubated twice.  Self extubated on 9/5.  Currently respiratory status stable on nasal cannula.  On 5 L of oxygen per minute. Continue pulmonary toileting.  MRSA pneumonia: Day 6/7 of vancomycin.  Continue to complete the course.  Encephalopathy: Secondary to seizures, ICU delirium, sedating medicines.  Neurology following.  Continues to remain altered.  EEG on 04/01/2019 showed severe encephalopathy but no seizures.  Prolonged encephalopathy most likely secondary to prolonged seizure episodes/status epilepticus.  Seizure disorder: On Vimpat, Keppra.  Neurology following.  AKI suspected secondary to contrast nephropathy:  Nephrology following.  Started on dialysis on 8/24 at [facility].  TTS schedule.  He is having good urine output.  Expecting renal recovery.  Nephrology following.  He has a permanent cath on the right chest.HD on hold.  Hypernatremia: Persistent .Continue free water and half-normal saline  Hypertension: Continue blood pressure medicines.  Continue to monitor blood pressure  Dysphagia/protein calorie malnutrition: Dietitian following.  Currently on tube feeding. Speech therapy continues to recommend NPO.  Diabetes mellitus: Continue sliding scale insulin.  History of schizophrenia/dementia: Continue supportive care.  History of left CVA: Has residual weakness on the left side.  Skilled nursing facility resident.He had slurred speech on baseline.  Debility/deconditioning: PT/OT consulted and recommended skilled nursing facility.  Social worker made aware.  When he is medically ready for discharge, he will be discharged back to skilled nursing facility at [facility].  Nutrition Problem: Inadequate oral intake Etiology: inability to eat      DVT prophylaxis: Heparin [place] Code Status: Full Family Communication: Called and talked to daughter and father on phone.Daughter makes the decisions Disposition Plan: Back to skilled nursing facility at [facility] when medically stable   Consultants: PCCM, neurology, nephrology  Procedures: Intubation, extubation, dialysis catheter placement  Antimicrobials:  Anti-infectives (From admission, onward)   Start     Dose/Rate Route Frequency Ordered Stop   04/07/19 1000  vancomycin (VANCOCIN) 500 mg in sodium chloride 0.9 % 100 mL IVPB     500 mg 100 mL/hr over 60 Minutes Intravenous  Once 04/07/19 0835 04/07/19 1026   04/04/19 0830  vancomycin (VANCOCIN) 1,500 mg in sodium chloride 0.9 % 500 mL IVPB     1,500 mg 250 mL/hr over 120 Minutes  Intravenous  Once 04/04/19 0810 04/04/19 1127   04/03/19 1200  vancomycin (VANCOCIN) IVPB 1000 mg/200 mL premix   Status:  Discontinued     1,000 mg 200 mL/hr over 60 Minutes Intravenous Every T-Th-Sa (Hemodialysis) 04/01/19 1253 04/03/19 0744   04/03/19 0746  vancomycin variable dose per unstable renal function (pharmacist dosing)      Does not apply See admin instructions 04/03/19 0746     04/01/19 1400  vancomycin (VANCOCIN) 2,000 mg in sodium chloride 0.9 % 500 mL IVPB     2,000 mg 250 mL/hr over 120 Minutes Intravenous  Once 04/01/19 1253 04/01/19 1900   04/01/19 0800  meropenem (MERREM) 500 mg in sodium chloride 0.9 % 100 mL IVPB  Status:  Discontinued     500 mg 200 mL/hr over 30 Minutes Intravenous Every 24 hours 04/01/19 0059 04/03/19 0757   03/31/19 2315  piperacillin-tazobactam (ZOSYN) IVPB 3.375 g     3.375 g 100 mL/hr over 30 Minutes Intravenous  Once 03/31/19 2300 04/01/19 0031      Subjective:   Patient seen and examined at the bedside this morning.  Hemodynamically stable.  Continues to remain encephalopathic.  Does not follow commands or participate with communication.No changes from yesterday.  Objective: Vitals:   04/06/19 2342 04/07/19 0351 04/07/19 0500 04/07/19 0800  BP:    (!) 165/84  Pulse:    (!) 106  Resp:    (!) 21  Temp: 98.7 F (37.1 C) 98.7 F (37.1 C)  98 F (36.7 C)  TempSrc: Oral Oral  Oral  SpO2:    90%  Weight:   84.4 kg   Height:        Intake/Output Summary (Last 24 hours) at 04/07/2019 1147 Last data filed at 04/07/2019 0403 Gross per 24 hour  Intake 1725 ml  Output 1625 ml  Net 100 ml   Filed Weights   04/04/19 0500 04/06/19 0500 04/07/19 0500  Weight: 85.3 kg 84.4 kg 84.4 kg    Examination:  General exam: Not in distress, extremely deconditioned/debilitated HEENT:Oral mucosa moist, Ear/Nose normal on gross exam, feeding tube Respiratory system: Bilateral equal air entry, normal vesicular breath sounds, no wheezes or crackles  Cardiovascular system: S1 & S2 heard, RRR. No JVD, murmurs, rubs, gallops or clicks. No pedal edema.  Gastrointestinal system: Abdomen is nondistended, soft and nontender. No organomegaly or masses felt. Normal bowel sounds heard. Central nervous system: Not alert or oriented.  Left residual hemiparesis with contractures .  Eyes open extremities: No edema, no clubbing ,no cyanosis, distal peripheral pulses palpable. Skin: No rashes, lesions or ulcers,no icterus ,no pallor     Data Reviewed: I have personally reviewed following labs and imaging studies  CBC: Recent Labs  Lab 03/31/19 2317 04/01/19 0228  04/01/19 2129 04/03/19 0511 04/03/19 0521 04/04/19 0435 04/06/19 0536  WBC 10.9* 10.9*  --   --   --  7.2  --  7.6  HGB 9.4* 9.6*   < > 8.5* 9.9* 9.2* 9.2* 11.2*  HCT 27.5* 28.1*   < > 25.0* 29.0* 28.3* 27.0* 35.9*  MCV 79.9* 79.8*  --   --   --  83.5  --  84.7  PLT 312 315  --   --   --  316  --  301   < > = values in this interval not displayed.   Basic Metabolic Panel: Recent Labs  Lab 04/01/19 1135  04/01/19 2316 04/02/19 0272 04/02/19 1611  04/03/19 0521  04/04/19 0452 04/05/19  1610 04/06/19 0536 04/06/19 2140 04/07/19 0520  NA  --    < >  --  147*  --    < > 151*   < > 155* 159* 159* 156* 158*  K  --    < >  --  3.5  --    < > 3.6   < > 3.8 3.6 3.7 3.5 3.8  CL  --   --   --  113*  --   --  117*  --  122* 126* 128* 124* 126*  CO2  --   --   --  22  --   --  21*  --  24 22 23  21* 23  GLUCOSE  --   --   --  95  --   --  135*  --  130* 183* 197* 149* 177*  BUN  --   --   --  58*  --   --  65*  --  64* 64* 69* 69* 63*  CREATININE  --   --   --  6.39*  --   --  5.92*  --  5.17* 4.31* 4.26* 4.05* 3.76*  CALCIUM  --   --   --  8.3*  --   --  8.3*  --  8.3* 8.8* 9.0 8.5* 8.8*  MG 2.1  --  2.3 2.3 2.3  --  2.1  --   --   --   --   --   --   PHOS 4.4  --  3.9 4.8* 4.6  --  4.6  --  4.7*  --   --  3.4  --    < > = values in this interval not displayed.   GFR: Estimated Creatinine Clearance: 22.1 mL/min (A) (by C-G formula based on SCr of 3.76 mg/dL (H)). Liver Function  Tests: Recent Labs  Lab 03/31/19 2317 04/02/19 0634 04/04/19 0452 04/06/19 2140  AST 63*  --   --   --   ALT 81*  --   --   --   ALKPHOS 52  --   --   --   BILITOT 0.8  --   --   --   PROT 6.7  --   --   --   ALBUMIN 1.9* 1.9* 1.9* 2.0*   No results for input(s): LIPASE, AMYLASE in the last 168 hours. Recent Labs  Lab 04/02/19 2030  AMMONIA 17   Coagulation Profile: No results for input(s): INR, PROTIME in the last 168 hours. Cardiac Enzymes: No results for input(s): CKTOTAL, CKMB, CKMBINDEX, TROPONINI in the last 168 hours. BNP (last 3 results) No results for input(s): PROBNP in the last 8760 hours. HbA1C: No results for input(s): HGBA1C in the last 72 hours. CBG: Recent Labs  Lab 04/06/19 1705 04/06/19 1958 04/06/19 2340 04/07/19 0353 04/07/19 0802  GLUCAP 172* 142* 149* 140* 156*   Lipid Profile: No results for input(s): CHOL, HDL, LDLCALC, TRIG, CHOLHDL, LDLDIRECT in the last 72 hours. Thyroid Function Tests: No results for input(s): TSH, T4TOTAL, FREET4, T3FREE, THYROIDAB in the last 72 hours. Anemia Panel: No results for input(s): VITAMINB12, FOLATE, FERRITIN, TIBC, IRON, RETICCTPCT in the last 72 hours. Sepsis Labs: No results for input(s): PROCALCITON, LATICACIDVEN in the last 168 hours.  Recent Results (from the past 240 hour(s))  MRSA PCR Screening     Status: Abnormal   Collection Time: 03/31/19  9:42 PM   Specimen: Nasal Mucosa; Nasopharyngeal  Result Value Ref  Range Status   MRSA by PCR POSITIVE (A) NEGATIVE Final    Comment:        The GeneXpert MRSA Assay (FDA approved for NASAL specimens only), is one component of a comprehensive MRSA colonization surveillance program. It is not intended to diagnose MRSA infection nor to guide or monitor treatment for MRSA infections. RESULT CALLED TO, READ BACK BY AND VERIFIED WITH: Dixon Boos RN [id number] 03/31/19 A BROWNING Performed at [facility], [place] [address] [place], [place] [address]    Culture, respiratory (non-expectorated)     Status: None   Collection Time: 04/01/19 11:30 AM   Specimen: Tracheal Aspirate; Respiratory  Result Value Ref Range Status   Specimen Description TRACHEAL ASPIRATE  Final   Special Requests NONE  Final   Gram Stain   Final    FEW WBC PRESENT, PREDOMINANTLY PMN MODERATE GRAM POSITIVE COCCI IN PAIRS IN CLUSTERS Performed at [facility], [address], [place], [place] [address]    Culture   Final    ABUNDANT METHICILLIN RESISTANT STAPHYLOCOCCUS AUREUS   Report Status 04/03/2019 FINAL  Final   Organism ID, Bacteria METHICILLIN RESISTANT STAPHYLOCOCCUS AUREUS  Final      Susceptibility   Methicillin resistant staphylococcus aureus - MIC*    CIPROFLOXACIN >=8 RESISTANT Resistant     ERYTHROMYCIN >=8 RESISTANT Resistant     GENTAMICIN <=0.5 SENSITIVE Sensitive     OXACILLIN >=4 RESISTANT Resistant     TETRACYCLINE 2 SENSITIVE Sensitive     VANCOMYCIN 1 SENSITIVE Sensitive     TRIMETH/SULFA <=10 SENSITIVE Sensitive     CLINDAMYCIN >=8 RESISTANT Resistant     RIFAMPIN <=0.5 SENSITIVE Sensitive     Inducible Clindamycin NEGATIVE Sensitive     * ABUNDANT METHICILLIN RESISTANT STAPHYLOCOCCUS AUREUS         Radiology Studies: No results found.      Scheduled Meds: . amLODipine  10 mg Per Tube Daily  . chlorhexidine gluconate (MEDLINE KIT)  15 mL Mouth Rinse BID  . Chlorhexidine Gluconate Cloth  6 each Topical Daily  . feeding supplement (PRO-STAT SUGAR FREE 64)  30 mL Per Tube TID  . free water  400 mL Per Tube Q4H  . heparin  5,000 Units Subcutaneous Q8H  . insulin aspart  0-9 Units Subcutaneous Q4H  . mouth rinse  15 mL Mouth Rinse 10 times per day  . pantoprazole sodium  40 mg Per Tube Daily  . vancomycin variable dose per unstable renal function (pharmacist dosing)   Does not apply See admin instructions   Continuous Infusions: . sodium chloride 125 mL/hr at 04/07/19 0343  . sodium chloride Stopped (04/02/19 0029)   . sodium chloride 10 mL/hr at 04/04/19 2000  . feeding supplement (VITAL 1.5 CAL) 1,000 mL (04/06/19 1025)  . lacosamide (VIMPAT) IV 100 mg (04/07/19 1100)  . levETIRAcetam 500 mg (04/06/19 2331)     LOS: 7 days    Time spent: 35 mins      Shelly Coss, MD [facility] Pager [phone]  If 7PM-7AM, please contact night-coverage [email or web address] Password [email or web address] 04/07/2019, 11:47 AM

## 2019-04-07 NOTE — Progress Notes (Addendum)
Patient noted to have NG tube pulled 3 inches out of nose. Patient waving right hand in air with mitten. Tube feeding immediately shut off. Charge nurse in with this nurse to assess. Patient has productive cough. NG tube readvance to tape settiing. MD [facility] notified of tube misplacement. Also request for soft wrist restraint for right hand. Patient cont to attempt to move tube with mitten on. Reinstructed to not pull out tube or put hands near tube. No evidence of learning is noted. Patient does not respond or interact with this nurse.   2313-Abd x ray ordered stat  2349- Request for right wrist restraint due to patient attempting to pull at NG tube tonight.  04/08/2019- 0700- Will pass on to day shift RN to notify family of restraint placed on right wrist.

## 2019-04-07 NOTE — Progress Notes (Signed)
The PTA med list was transcribed from the medication reconciliation at admission to the hospital in [place]. We cannot verify it's accuracy. The facility he was admitted from cannot/will not forward [place] a medication list.  Romeo Rabon, PharmD. Mobile: [phone]. 04/07/2019,7:57 AM.

## 2019-04-07 NOTE — Progress Notes (Signed)
Pharmacy Antibiotic Note  Chad Mcclure is a 63 y.o. male admitted on 03/31/2019 with pneumonia.  Respiratory cultures returned with MRSA, currently on day 7/7 of vancomycin. Pt is AKI and new start HD (received TuThSat at outside hospital). Patient showing some signs of renal recovery, need for dialysis being assessed daily - currently being held.  Random vanc level 9/5 AM - 20 mg/dl - no HD. Random vanc level 9/6 AM - 16 mg/dl - 1500mg  IV Vanc given. Random vanc level 9/8 AM - 21 mg/dl, no HD.   Random vanc level 9/9 AM - 13 mg/dl, no HD.  Plan: Vancomycin 500mg  IV x 1 to complete treatment course.   Height: 6' (182.9 cm) Weight: 186 lb 1.1 oz (84.4 kg) IBW/kg (Calculated) : 77.6  Temp (24hrs), Avg:98.4 F (36.9 C), Min:98 F (36.7 C), Max:98.7 F (37.1 C)  Recent Labs  Lab 03/31/19 2317 04/01/19 0228  04/03/19 0521  04/04/19 0452 04/05/19 0947 04/06/19 0536 04/06/19 2140 04/07/19 0520  WBC 10.9* 10.9*  --  7.2  --   --   --  7.6  --   --   CREATININE 5.96* 6.02*   < > 5.92*  --  5.17* 4.31* 4.26* 4.05* 3.76*  VANCORANDOM  --   --   --   --    < > 16  --  21  --  13   < > = values in this interval not displayed.    Estimated Creatinine Clearance: 22.1 mL/min (A) (by C-G formula based on SCr of 3.76 mg/dL (H)).    Per [place] [facility] - MRSA 9/3 Sputem here also MRSA  [place], Student-PharmD 04/07/2019 8:29 AM

## 2019-04-07 NOTE — Procedures (Signed)
PROCEDURE SUMMARY:  Successful removal of tunneled right subclavian HD catheter. No immediate complications.  EBL < 5 mL. Pressure dressing applied to site. Patient tolerated well.   Please see imaging section of Epic for full dictation.   Claris Pong Louk PA-C 04/07/2019 2:27 PM

## 2019-04-08 ENCOUNTER — Inpatient Hospital Stay (HOSPITAL_COMMUNITY): Payer: Medicaid Other

## 2019-04-08 LAB — CBC WITH DIFFERENTIAL/PLATELET
Abs Immature Granulocytes: 0.03 10*3/uL (ref 0.00–0.07)
Basophils Absolute: 0 10*3/uL (ref 0.0–0.1)
Basophils Relative: 0 %
Eosinophils Absolute: 0.2 10*3/uL (ref 0.0–0.5)
Eosinophils Relative: 3 %
HCT: 35.9 % — ABNORMAL LOW (ref 39.0–52.0)
Hemoglobin: 11.6 g/dL — ABNORMAL LOW (ref 13.0–17.0)
Immature Granulocytes: 0 %
Lymphocytes Relative: 23 %
Lymphs Abs: 1.8 10*3/uL (ref 0.7–4.0)
MCH: 27.4 pg (ref 26.0–34.0)
MCHC: 32.3 g/dL (ref 30.0–36.0)
MCV: 84.7 fL (ref 80.0–100.0)
Monocytes Absolute: 0.6 10*3/uL (ref 0.1–1.0)
Monocytes Relative: 7 %
Neutro Abs: 5.2 10*3/uL (ref 1.7–7.7)
Neutrophils Relative %: 67 %
Platelets: 205 10*3/uL (ref 150–400)
RBC: 4.24 MIL/uL (ref 4.22–5.81)
RDW: 16 % — ABNORMAL HIGH (ref 11.5–15.5)
WBC: 7.7 10*3/uL (ref 4.0–10.5)
nRBC: 0 % (ref 0.0–0.2)

## 2019-04-08 LAB — GLUCOSE, CAPILLARY
Glucose-Capillary: 103 mg/dL — ABNORMAL HIGH (ref 70–99)
Glucose-Capillary: 107 mg/dL — ABNORMAL HIGH (ref 70–99)
Glucose-Capillary: 142 mg/dL — ABNORMAL HIGH (ref 70–99)
Glucose-Capillary: 148 mg/dL — ABNORMAL HIGH (ref 70–99)
Glucose-Capillary: 165 mg/dL — ABNORMAL HIGH (ref 70–99)
Glucose-Capillary: 167 mg/dL — ABNORMAL HIGH (ref 70–99)
Glucose-Capillary: 180 mg/dL — ABNORMAL HIGH (ref 70–99)

## 2019-04-08 LAB — BASIC METABOLIC PANEL
Anion gap: 10 (ref 5–15)
Anion gap: 12 (ref 5–15)
BUN: 54 mg/dL — ABNORMAL HIGH (ref 8–23)
BUN: 52 mg/dL — ABNORMAL HIGH (ref 8–23)
CO2: 21 mmol/L — ABNORMAL LOW (ref 22–32)
CO2: 18 mmol/L — ABNORMAL LOW (ref 22–32)
Calcium: 8.3 mg/dL — ABNORMAL LOW (ref 8.9–10.3)
Calcium: 8.2 mg/dL — ABNORMAL LOW (ref 8.9–10.3)
Chloride: 121 mmol/L — ABNORMAL HIGH (ref 98–111)
Chloride: 120 mmol/L — ABNORMAL HIGH (ref 98–111)
Creatinine, Ser: 3.01 mg/dL — ABNORMAL HIGH (ref 0.61–1.24)
Creatinine, Ser: 2.97 mg/dL — ABNORMAL HIGH (ref 0.61–1.24)
GFR calc Af Amer: 24 mL/min — ABNORMAL LOW (ref >60)
GFR calc Af Amer: 25 mL/min — ABNORMAL LOW (ref >60)
GFR calc non Af Amer: 21 mL/min — ABNORMAL LOW (ref >60)
GFR calc non Af Amer: 21 mL/min — ABNORMAL LOW (ref >60)
Glucose, Bld: 184 mg/dL — ABNORMAL HIGH (ref 70–99)
Glucose, Bld: 151 mg/dL — ABNORMAL HIGH (ref 70–99)
Potassium: 3.4 mmol/L — ABNORMAL LOW (ref 3.5–5.1)
Potassium: 3.8 mmol/L (ref 3.5–5.1)
Sodium: 152 mmol/L — ABNORMAL HIGH (ref 135–145)
Sodium: 150 mmol/L — ABNORMAL HIGH (ref 135–145)

## 2019-04-08 MED ORDER — FREE WATER
400.0000 mL | Status: DC
  Administered 2019-04-08 – 2019-04-21 (×83): 400 mL

## 2019-04-08 NOTE — Progress Notes (Signed)
Admit: 03/31/2019 LOS: 8  75M with AKI, req HD, now on hold, AMS  Subjective:  . TDC removed yesterday with IR, appreciate their assistance . A.m. labs pending . 2.7 L urine output yesterday, only 2.2 L in (is this being accurately reported, on 0.4L FWF reported)   09/09 0701 - 09/10 0700 In: 2228.6 [I.V.:1078.6; NG/GT:1000; IV Piggyback:150] Out: 2700 [Urine:2700]  Filed Weights   04/06/19 0500 04/07/19 0500 04/08/19 0500  Weight: 84.4 kg 84.4 kg 85.2 kg    Scheduled Meds: . amLODipine  10 mg Per Tube Daily  . chlorhexidine gluconate (MEDLINE KIT)  15 mL Mouth Rinse BID  . Chlorhexidine Gluconate Cloth  6 each Topical Daily  . feeding supplement (PRO-STAT SUGAR FREE 64)  30 mL Per Tube TID  . free water  400 mL Per Tube Q4H  . heparin  5,000 Units Subcutaneous Q8H  . insulin aspart  0-9 Units Subcutaneous Q4H  . mouth rinse  15 mL Mouth Rinse 10 times per day  . pantoprazole sodium  40 mg Per Tube Daily  . vancomycin variable dose per unstable renal function (pharmacist dosing)   Does not apply See admin instructions   Continuous Infusions: . sodium chloride Stopped (04/02/19 0029)  . sodium chloride 10 mL/hr at 04/04/19 2000  . dextrose 125 mL/hr at 04/08/19 0302  . feeding supplement (VITAL 1.5 CAL) 1,000 mL (04/06/19 1025)  . lacosamide (VIMPAT) IV 100 mg (04/08/19 1026)  . levETIRAcetam 500 mg (04/07/19 2155)   PRN Meds:.sodium chloride, sodium chloride, acetaminophen (TYLENOL) oral liquid 160 mg/5 mL, fentaNYL (SUBLIMAZE) injection, fentaNYL (SUBLIMAZE) injection, hydrALAZINE, LORazepam, sodium chloride flush  Current Labs: reviewed  Physical Exam:  Blood pressure (!) 147/82, pulse 95, temperature 99.5 F (37.5 C), temperature source Axillary, resp. rate (!) 21, height 6' (1.829 m), weight 85.2 kg, SpO2 95 %. Chronically ill-appearing, encephalopathic, not interactive Tachycardic, regular, normal S1 and S2 Coarse breath sounds bilaterally, normal work of  breathing Soft, nontender  A 1. AKI, previously required dialysis, etiology contrast nephropathy, improving excellent UOP and downtrending creatinine consistent with recovery 2. Hypernatremia, likely solute diuresis, still with water deficit 3. Encephalopathy, per neurology and CCM; likley has a chronic component 4. Acute hypoxic respiratory failure, self extubation 9/5, stable on nasal cannula 5. Hypertension, on amlodipine, stable 6. SNF resident 7. Schizophrenia 8. Chart history of dementia 9. History of left CVA  P  . Cont FWF at 439m but inc to q3h, change D5W to 1510mh . TID BMP . Medication Issues; o Preferred narcotic agents for pain control are hydromorphone, fentanyl, and methadone. Morphine should not be used.  o Baclofen should be avoided o Avoid oral sodium phosphate and magnesium citrate based laxatives / bowel preps    RyPearson GrippeD 04/08/2019, 10:44 AM  Recent Labs  Lab 04/03/19 0521  04/04/19 0452  04/06/19 2140 04/07/19 0520 04/07/19 1341  NA 151*   < > 155*   < > 156* 158* 157*  K 3.6   < > 3.8   < > 3.5 3.8 3.6  CL 117*  --  122*   < > 124* 126* 125*  CO2 21*  --  24   < > 21* 23 23  GLUCOSE 135*  --  130*   < > 149* 177* 177*  BUN 65*  --  64*   < > 69* 63* 64*  CREATININE 5.92*  --  5.17*   < > 4.05* 3.76* 3.31*  CALCIUM 8.3*  --  8.3*   < > 8.5* 8.8* 8.4*  PHOS 4.6  --  4.7*  --  3.4  --   --    < > = values in this interval not displayed.   Recent Labs  Lab 04/03/19 0521 04/04/19 0435 04/06/19 0536  WBC 7.2  --  7.6  HGB 9.2* 9.2* 11.2*  HCT 28.3* 27.0* 35.9*  MCV 83.5  --  84.7  PLT 316  --  301

## 2019-04-08 NOTE — Progress Notes (Signed)
PROGRESS NOTE    Chad Mcclure  KWI:[id number] DOB: 1956-03-24 DOA: 03/31/2019 PCP: Patient, No Pcp Per   Brief Narrative:  Patient is a 63 year old male with H/O HTN, HLD, HCV, Left CVA, DM, schizophrenia, dementia and who resides at SNF in [place] , who initially was admitted to [facility] on 8/20 with fevers.  He was suspected to have pneumonia and cystitis.  He required intubation on 8/21 for acute hypoxic respiratory failure.  Extubated on 8/29 did not require intubation on 8/31 for status epilepticus. He required initiation of hemodialysis after failing a lasix challenge, TTS schedule planned (R subclavian perm cath placed). He was transferred to [facility] on 9/2 and was under PCCM service.  He has been extubated now and transferred to hospitalist team on 04/06/2019.  He is being managed for MRSA associated pneumonia, seizures.  Hospital course remarkable for persistent encephalopathy.  Nephrology following for dialysis.  Neurology also following.  Assessment & Plan:   Principal Problem:   Acute respiratory failure with hypoxia (HCC) Active Problems:   Status epilepticus ([place])   AKI (acute kidney injury) ([place])   Encephalopathy acute   Aspiration pneumonia (HCC)   Acute hypoxic respiratory failure: Currently  being treated for MRSA pneumonia.  Had to be intubated twice.  Self extubated on 9/5.  Currently respiratory status stable on nasal cannula.  On 5 L of oxygen per minute. Continue pulmonary toileting.  MRSA pneumonia: Completed  the course of vancomycin.  Currently respiratory status is stable.  Encephalopathy: Secondary to seizures, ICU delirium, sedating medicines.  Neurology following.  EEG on 04/01/2019 showed severe encephalopathy but no seizures.  Prolonged encephalopathy most likely secondary to prolonged seizure episodes/status epilepticus. Mental status has significantly improved today.  He started communicating.  He follows commands.  He knows that he is in the  hospital.  Seizure disorder: On Vimpat, Keppra.  Neurology was following.  AKI suspected secondary to contrast nephropathy: Nephrology following.  Started on dialysis on 8/24 at [facility]. He is having good urine output.  Expecting renal recovery.  Nephrology following.  He had permanent cath on the right chest which has been removed.  Hypernatremia: Persistent .Continue free water and D5W  Hypertension: Continue blood pressure medicines.  Continue to monitor blood pressure  Dysphagia/protein calorie malnutrition: Dietitian following.  Currently on tube feeding. Speech therapy continues to recommend NPO.Anticipate improvement .  Diabetes mellitus: Continue sliding scale insulin.  History of schizophrenia/dementia: Continue supportive care.  History of left CVA: Has residual weakness on the left side.  Skilled nursing facility resident.He had slurred speech on baseline.  Debility/deconditioning: PT/OT consulted and recommended skilled nursing facility.  Social worker made aware.  When he is medically ready for discharge, he will be discharged back to skilled nursing facility at [facility].  Nutrition Problem: Inadequate oral intake Etiology: inability to eat      DVT prophylaxis: Heparin [place] Code Status: Full Family Communication: Called and talked to daughter on 03/08/19 Disposition Plan: Back to skilled nursing facility at [facility] when medically stable   Consultants: PCCM, neurology, nephrology  Procedures: Intubation, extubation, dialysis catheter placement  Antimicrobials:  Anti-infectives (From admission, onward)   Start     Dose/Rate Route Frequency Ordered Stop   04/07/19 1000  vancomycin (VANCOCIN) 500 mg in sodium chloride 0.9 % 100 mL IVPB     500 mg 100 mL/hr over 60 Minutes Intravenous  Once 04/07/19 0835 04/07/19 1026   04/04/19 0830  vancomycin (VANCOCIN) 1,500 mg in sodium chloride 0.9 %  500 mL IVPB     1,500 mg 250 mL/hr over 120 Minutes Intravenous  Once  04/04/19 0810 04/04/19 1127   04/03/19 1200  vancomycin (VANCOCIN) IVPB 1000 mg/200 mL premix  Status:  Discontinued     1,000 mg 200 mL/hr over 60 Minutes Intravenous Every T-Th-Sa (Hemodialysis) 04/01/19 1253 04/03/19 0744   04/03/19 0746  vancomycin variable dose per unstable renal function (pharmacist dosing)      Does not apply See admin instructions 04/03/19 0746     04/01/19 1400  vancomycin (VANCOCIN) 2,000 mg in sodium chloride 0.9 % 500 mL IVPB     2,000 mg 250 mL/hr over 120 Minutes Intravenous  Once 04/01/19 1253 04/01/19 1900   04/01/19 0800  meropenem (MERREM) 500 mg in sodium chloride 0.9 % 100 mL IVPB  Status:  Discontinued     500 mg 200 mL/hr over 30 Minutes Intravenous Every 24 hours 04/01/19 0059 04/03/19 0757   03/31/19 2315  piperacillin-tazobactam (ZOSYN) IVPB 3.375 g     3.375 g 100 mL/hr over 30 Minutes Intravenous  Once 03/31/19 2300 04/01/19 0031      Subjective:   Patient seen and examined the bedside this morning.  Currently hemodynamically stable.  Significant improvement in the mental status today.  He is more responsive, awake.  Obeys commands.  Tries to communicate and said he is at hospital.  Very slow and slurred speech.  Remains significantly weak and debilitated.  Objective: Vitals:   04/08/19 0402 04/08/19 0500 04/08/19 0600 04/08/19 0755  BP: (!) 158/88   (!) 147/82  Pulse:   68 95  Resp:   (!) 21 (!) 21  Temp:    99.5 F (37.5 C)  TempSrc:    Axillary  SpO2:   96% 95%  Weight:  85.2 kg    Height:        Intake/Output Summary (Last 24 hours) at 04/08/2019 1348 Last data filed at 04/08/2019 0505 Gross per 24 hour  Intake 2228.6 ml  Output 2700 ml  Net -471.4 ml   Filed Weights   04/06/19 0500 04/07/19 0500 04/08/19 0500  Weight: 84.4 kg 84.4 kg 85.2 kg    Examination:  General exam: Not in distress, extremely deconditioned/debilitated HEENT:Oral mucosa moist, Ear/Nose normal on gross exam, feeding tube Respiratory system:  Bilateral equal air entry, normal vesicular breath sounds, no wheezes or crackles  Cardiovascular system: S1 & S2 heard, RRR. No JVD, murmurs, rubs, gallops or clicks. No pedal edema. Gastrointestinal system: Abdomen is nondistended, soft and nontender. No organomegaly or masses felt. Normal bowel sounds heard. Central nervous system:  Left residual hemiparesis with contractures .  More Alert and awake today,oriented to place extremities: No edema, no clubbing ,no cyanosis, distal peripheral pulses palpable. Skin: No rashes, lesions or ulcers,no icterus ,no pallor     Data Reviewed: I have personally reviewed following labs and imaging studies  CBC: Recent Labs  Lab 04/03/19 0511 04/03/19 0521 04/04/19 0435 04/06/19 0536 04/08/19 1202  WBC  --  7.2  --  7.6 7.7  NEUTROABS  --   --   --   --  5.2  HGB 9.9* 9.2* 9.2* 11.2* 11.6*  HCT 29.0* 28.3* 27.0* 35.9* 35.9*  MCV  --  83.5  --  84.7 84.7  PLT  --  316  --  301 767   Basic Metabolic Panel: Recent Labs  Lab 04/01/19 2316  04/02/19 3419 04/02/19 1611  04/03/19 3790  04/04/19 2409  04/06/19 0536 04/06/19 2140  04/07/19 0520 04/07/19 1341 04/08/19 1202  NA  --   --  147*  --    < > 151*   < > 155*   < > 159* 156* 158* 157* 152*  K  --   --  3.5  --    < > 3.6   < > 3.8   < > 3.7 3.5 3.8 3.6 3.4*  CL  --    < > 113*  --   --  117*  --  122*   < > 128* 124* 126* 125* 121*  CO2  --    < > 22  --   --  21*  --  24   < > 23 21* 23 23 21*  GLUCOSE  --    < > 95  --   --  135*  --  130*   < > 197* 149* 177* 177* 184*  BUN  --    < > 58*  --   --  65*  --  64*   < > 69* 69* 63* 64* 54*  CREATININE  --    < > 6.39*  --   --  5.92*  --  5.17*   < > 4.26* 4.05* 3.76* 3.31* 3.01*  CALCIUM  --    < > 8.3*  --   --  8.3*  --  8.3*   < > 9.0 8.5* 8.8* 8.4* 8.3*  MG 2.3  --  2.3 2.3  --  2.1  --   --   --   --   --   --   --   --   PHOS 3.9  --  4.8* 4.6  --  4.6  --  4.7*  --   --  3.4  --   --   --    < > = values in this interval  not displayed.   GFR: Estimated Creatinine Clearance: 27.6 mL/min (A) (by C-G formula based on SCr of 3.01 mg/dL (H)). Liver Function Tests: Recent Labs  Lab 04/02/19 0634 04/04/19 0452 04/06/19 2140  ALBUMIN 1.9* 1.9* 2.0*   No results for input(s): LIPASE, AMYLASE in the last 168 hours. Recent Labs  Lab 04/02/19 2030  AMMONIA 17   Coagulation Profile: No results for input(s): INR, PROTIME in the last 168 hours. Cardiac Enzymes: No results for input(s): CKTOTAL, CKMB, CKMBINDEX, TROPONINI in the last 168 hours. BNP (last 3 results) No results for input(s): PROBNP in the last 8760 hours. HbA1C: No results for input(s): HGBA1C in the last 72 hours. CBG: Recent Labs  Lab 04/07/19 2002 04/07/19 2346 04/08/19 0337 04/08/19 0756 04/08/19 1206  GLUCAP 165* 103* 165* 180* 167*   Lipid Profile: No results for input(s): CHOL, HDL, LDLCALC, TRIG, CHOLHDL, LDLDIRECT in the last 72 hours. Thyroid Function Tests: No results for input(s): TSH, T4TOTAL, FREET4, T3FREE, THYROIDAB in the last 72 hours. Anemia Panel: No results for input(s): VITAMINB12, FOLATE, FERRITIN, TIBC, IRON, RETICCTPCT in the last 72 hours. Sepsis Labs: No results for input(s): PROCALCITON, LATICACIDVEN in the last 168 hours.  Recent Results (from the past 240 hour(s))  MRSA PCR Screening     Status: Abnormal   Collection Time: 03/31/19  9:42 PM   Specimen: Nasal Mucosa; Nasopharyngeal  Result Value Ref Range Status   MRSA by PCR POSITIVE (A) NEGATIVE Final    Comment:        The GeneXpert MRSA Assay (FDA approved for NASAL specimens only), is one  component of a comprehensive MRSA colonization surveillance program. It is not intended to diagnose MRSA infection nor to guide or monitor treatment for MRSA infections. RESULT CALLED TO, READ BACK BY AND VERIFIED WITH: Dixon Boos RN [id number] 03/31/19 A BROWNING Performed at [facility], [place] [address], [place], [place] [address]   Culture, respiratory  (non-expectorated)     Status: None   Collection Time: 04/01/19 11:30 AM   Specimen: Tracheal Aspirate; Respiratory  Result Value Ref Range Status   Specimen Description TRACHEAL ASPIRATE  Final   Special Requests NONE  Final   Gram Stain   Final    FEW WBC PRESENT, PREDOMINANTLY PMN MODERATE GRAM POSITIVE COCCI IN PAIRS IN CLUSTERS Performed at [facility], [address], [place], [place] [address]    Culture   Final    ABUNDANT METHICILLIN RESISTANT STAPHYLOCOCCUS AUREUS   Report Status 04/03/2019 FINAL  Final   Organism ID, Bacteria METHICILLIN RESISTANT STAPHYLOCOCCUS AUREUS  Final      Susceptibility   Methicillin resistant staphylococcus aureus - MIC*    CIPROFLOXACIN >=8 RESISTANT Resistant     ERYTHROMYCIN >=8 RESISTANT Resistant     GENTAMICIN <=0.5 SENSITIVE Sensitive     OXACILLIN >=4 RESISTANT Resistant     TETRACYCLINE 2 SENSITIVE Sensitive     VANCOMYCIN 1 SENSITIVE Sensitive     TRIMETH/SULFA <=10 SENSITIVE Sensitive     CLINDAMYCIN >=8 RESISTANT Resistant     RIFAMPIN <=0.5 SENSITIVE Sensitive     Inducible Clindamycin NEGATIVE Sensitive     * ABUNDANT METHICILLIN RESISTANT STAPHYLOCOCCUS AUREUS         Radiology Studies: Dg Abd 1 View  Result Date: 04/07/2019 CLINICAL DATA:  NG tube placement EXAM: ABDOMEN - 1 VIEW COMPARISON:  04/04/2019 FINDINGS: NG tube tip is in the mid to distal stomach. This has been slightly advanced since prior study. Nonobstructive bowel gas pattern. IMPRESSION: NG tube tip in the mid to distal stomach. Electronically Signed   By: Rolm Baptise M.D.   On: 04/07/2019 23:40   Ir Removal Tun Cv Cath W/o Fl  Result Date: 04/07/2019 INDICATION: Patient recently admitted to [facility] on 03/18/2019 for management of fever. His hospital course was complicated by hypoxic respiratory failure requiring intubation, status epilepticus, and AKI s/p tunneled right subclavian HD catheter placement at [facility] 03/22/2019. He was  transferred to [facility] 03/31/2023 escalation of care. Since, renal function has improved and patient no longer requires hemodialysis. Request is made for removal of tunneled hemodialysis catheter. EXAM: REMOVAL OF TUNNELED HEMODIALYSIS CATHETER MEDICATIONS: None COMPLICATIONS: None immediate. PROCEDURE: Informed written consent was obtained from the patient's daughter, Elford Evilsizer, via telephone following an explanation of the procedure, risks, benefits and alternatives to treatment. A time out was performed prior to the initiation of the procedure. Maximal barrier sterile technique was utilized including mask, sterile gowns, sterile gloves, large sterile drape, hand hygiene, and Hibiclens. Utilizing gentle traction, the catheter was removed intact. Hemostasis was obtained with manual compression. A dressing was placed. The patient tolerated the procedure well without immediate post procedural complication. IMPRESSION: Successful removal of tunneled hemodialysis catheter. Read by: Earley Abide, PA-C Electronically Signed   By: Lucrezia Europe M.D.   On: 04/07/2019 14:49        Scheduled Meds: . amLODipine  10 mg Per Tube Daily  . chlorhexidine gluconate (MEDLINE KIT)  15 mL Mouth Rinse BID  . Chlorhexidine Gluconate Cloth  6 each Topical Daily  . feeding supplement (  PRO-STAT SUGAR FREE 64)  30 mL Per Tube TID  . free water  400 mL Per Tube Q3H  . heparin  5,000 Units Subcutaneous Q8H  . insulin aspart  0-9 Units Subcutaneous Q4H  . mouth rinse  15 mL Mouth Rinse 10 times per day  . pantoprazole sodium  40 mg Per Tube Daily  . vancomycin variable dose per unstable renal function (pharmacist dosing)   Does not apply See admin instructions   Continuous Infusions: . sodium chloride Stopped (04/02/19 0029)  . sodium chloride 10 mL/hr at 04/04/19 2000  . dextrose 150 mL/hr at 04/08/19 1253  . feeding supplement (VITAL 1.5 CAL) 1,000 mL (04/06/19 1025)  . lacosamide (VIMPAT) IV 100 mg (04/08/19 1026)   . levETIRAcetam 500 mg (04/08/19 1249)     LOS: 8 days    Time spent: 35 mins      Shelly Coss, MD [facility] Pager [phone]  If 7PM-7AM, please contact night-coverage [email or web address] Password [email or web address] 04/08/2019, 1:48 PM

## 2019-04-08 NOTE — Progress Notes (Signed)
Neurology Progress Note   S:// Seen and examined.  No acute changes.   O:// Current vital signs: BP (!) 147/82 (BP Location: Left Arm)   Pulse 95   Temp 99.5 F (37.5 C) (Axillary)   Resp (!) 21   Ht 6' (1.829 m)   Wt 85.2 kg   SpO2 95%   BMI 25.47 kg/m  Vital signs in last 24 hours: Temp:  [97.6 F (36.4 C)-99.5 F (37.5 C)] 99.5 F (37.5 C) (09/10 0755) Pulse Rate:  [68-114] 95 (09/10 0755) Resp:  [16-23] 21 (09/10 0755) BP: (120-170)/(82-100) 147/82 (09/10 0755) SpO2:  [94 %-96 %] 95 % (09/10 0755) Weight:  [85.2 kg] 85.2 kg (09/10 0500) Neurological exam Lying in bed comfortably, tends to track the examiner. Was able to mouth name-Chad Mcclure. Attempted to also tell me his age but I am not sure if he gave me the correct age. Was not oriented to place or time. Extremely hypophonic voice and extremely bradyphrenic Cranial nerves: Pupillary asymmetry with reactive pupils, disconjugate gaze with right exotropia, possible left facial weakness. Motor exam: Contractures, left hemiparesis that is chronic per report.  Moves right upper and lower extremity to command with upper extremity antigravity and lower extremity barely 3/5 but is able to wiggle toes to command. Sensory exam: Intact to noxious stimulation in all fours DTRs: Brisk on the left.  Medications  Current Facility-Administered Medications:  .  0.9 %  sodium chloride infusion, , Intravenous, PRN, Renee Pain, MD, Stopped at 04/02/19 0029 .  0.9 %  sodium chloride infusion, , Intravenous, PRN, Deterding, Guadelupe Sabin, MD, Last Rate: 10 mL/hr at 04/04/19 2000 .  acetaminophen (TYLENOL) solution 500 mg, 500 mg, Per Tube, Q6H PRN, Margaretha Seeds, MD, 500 mg at 04/04/19 1650 .  amLODipine (NORVASC) tablet 10 mg, 10 mg, Per Tube, Daily, Elsie Lincoln, MD, 10 mg at 04/07/19 0921 .  chlorhexidine gluconate (MEDLINE KIT) (PERIDEX) 0.12 % solution 15 mL, 15 mL, Mouth Rinse, BID, Desai, Rahul P, PA-C, 15 mL at 04/07/19  2318 .  Chlorhexidine Gluconate Cloth 2 % PADS 6 each, 6 each, Topical, Daily, Desai, Rahul P, PA-C, 6 each at 04/07/19 2319 .  dextrose 5 % solution, , Intravenous, Continuous, Pearson Grippe B, MD, Last Rate: 125 mL/hr at 04/08/19 0302 .  feeding supplement (PRO-STAT SUGAR FREE 64) liquid 30 mL, 30 mL, Per Tube, TID, Chesley Mires, MD, 30 mL at 04/07/19 2146 .  feeding supplement (VITAL 1.5 CAL) liquid 1,000 mL, 1,000 mL, Per Tube, Continuous, Sood, Vineet, MD, Last Rate: 50 mL/hr at 04/06/19 1025, 1,000 mL at 04/06/19 1025 .  fentaNYL (SUBLIMAZE) injection 50 mcg, 50 mcg, Intravenous, Q15 min PRN, Desai, Rahul P, PA-C, 50 mcg at 04/07/19 0352 .  fentaNYL (SUBLIMAZE) injection 50-200 mcg, 50-200 mcg, Intravenous, Q30 min PRN, Desai, Rahul P, PA-C, 100 mcg at 04/04/19 0334 .  free water 400 mL, 400 mL, Per Tube, Q4H, Sanford, Ryan B, MD, 400 mL at 04/08/19 0650 .  heparin injection 5,000 Units, 5,000 Units, Subcutaneous, Q8H, Desai, Rahul P, PA-C, 5,000 Units at 04/08/19 7035 .  hydrALAZINE (APRESOLINE) injection 10 mg, 10 mg, Intravenous, Q4H PRN, Margaretha Seeds, MD, 10 mg at 04/04/19 0921 .  insulin aspart (novoLOG) injection 0-9 Units, 0-9 Units, Subcutaneous, Q4H, Desai, Rahul P, PA-C, 2 Units at 04/08/19 0900 .  lacosamide (VIMPAT) 100 mg in sodium chloride 0.9 % 25 mL IVPB, 100 mg, Intravenous, Q12H, Desai, Rahul P, PA-C, Last Rate: 70 mL/hr at 04/07/19  2314, 100 mg at 04/07/19 2314 .  levETIRAcetam (KEPPRA) IVPB 500 mg/100 mL premix, 500 mg, Intravenous, Q12H, Desai, Rahul P, PA-C, Last Rate: 400 mL/hr at 04/07/19 2155, 500 mg at 04/07/19 2155 .  LORazepam (ATIVAN) injection 1-4 mg, 1-4 mg, Intravenous, Q2H PRN, Desai, Rahul P, PA-C, 2 mg at 04/07/19 0350 .  MEDLINE mouth rinse, 15 mL, Mouth Rinse, 10 times per day, Desai, Rahul P, PA-C, 15 mL at 04/08/19 0651 .  pantoprazole sodium (PROTONIX) 40 mg/20 mL oral suspension 40 mg, 40 mg, Per Tube, Daily, Agarwala, Ravi, MD, 40 mg at 04/07/19  0921 .  sodium chloride flush (NS) 0.9 % injection 10-40 mL, 10-40 mL, Intracatheter, PRN, Sood, Vineet, MD .  vancomycin variable dose per unstable renal function (pharmacist dosing), , Does not apply, See admin instructions, Rush Farmer, MD Labs CBC    Component Value Date/Time   WBC 7.6 04/06/2019 0536   RBC 4.24 04/06/2019 0536   HGB 11.2 (L) 04/06/2019 0536   HCT 35.9 (L) 04/06/2019 0536   PLT 301 04/06/2019 0536   MCV 84.7 04/06/2019 0536   MCH 26.4 04/06/2019 0536   MCHC 31.2 04/06/2019 0536   RDW 16.0 (H) 04/06/2019 0536   LYMPHSABS 3.2 12/18/2010 0400   MONOABS 0.8 12/18/2010 0400   EOSABS 0.0 12/18/2010 0400   BASOSABS 0.0 12/18/2010 0400    CMP     Component Value Date/Time   NA 157 (H) 04/07/2019 1341   K 3.6 04/07/2019 1341   CL 125 (H) 04/07/2019 1341   CO2 23 04/07/2019 1341   GLUCOSE 177 (H) 04/07/2019 1341   BUN 64 (H) 04/07/2019 1341   CREATININE 3.31 (H) 04/07/2019 1341   CALCIUM 8.4 (L) 04/07/2019 1341   PROT 6.7 03/31/2019 2317   ALBUMIN 2.0 (L) 04/06/2019 2140   AST 63 (H) 03/31/2019 2317   ALT 81 (H) 03/31/2019 2317   ALKPHOS 52 03/31/2019 2317   BILITOT 0.8 03/31/2019 2317   GFRNONAA 19 (L) 04/07/2019 1341   GFRAA 22 (L) 04/07/2019 1341  NA 157 Imaging I have reviewed images in epic and the results pertinent to this consultation are: No new imaging to review  Assessment:  63 year old man with persistent encephalopathy status post seizure and possible respiratory failure due to aspiration pneumonia transferred from outside hospital for higher level of care.  Reported history of schizophrenia and possibly underlying dementia along with prior stroke with residual left hemiparesis. EEG on 04/01/2019 with severe encephalopathy no seizures. Likely long encephalopathy from multifactorial toxic metabolic encephalopathy and seizures in the setting of somebody with pre-existing neurological and psychiatric illnesses. Examination is improving but  rather slowly.  I would expect to continue to see slow improvement.  Impression: Encephalopathy-likely secondary to prolonged seizure versus medications versus multifactorial toxic metabolic etiology. Chronic left hemiparesis Hypernatremia   Recommendations: Keppra 500 twice daily Vimpat 100 twice daily Management of toxic metabolic derangements per primary team as you are. PT OT Please call neurology with questions as needed.   -- Amie Portland, MD [facility] Pager: [phone] If 7pm to 7am, please call on call as listed on AMION.

## 2019-04-08 NOTE — Progress Notes (Signed)
NG tube came out at 1215. Tried to reinsert and tube became bloody, so I turned it off and waiting for a doctors response.

## 2019-04-08 NOTE — Progress Notes (Signed)
  Speech Language Pathology Treatment: Dysphagia  Patient Details Name: Chad Mcclure MRN: [id number] DOB: 03-18-1956 Today's Date: 04/08/2019 Time: 5170-0174 SLP Time Calculation (min) (ACUTE ONLY): 21 min  Assessment / Plan / Recommendation Clinical Impression  Pt will wake however takes approximately 10 minutes of frequent multimodal cues, activity and stimulation (vigorous oral care) to reach his maximum wakefulness and maintain. He follows simple commands with delays and need for repetition. Soft mucous extending from hard palate to tongue removed with assist of ice chips to moisten. Significantly deconditioned, poor respiratory support for efficient volitional  cough and to achieve deep inhalations.Respiration and deglutory systems impacted from motor weakness descending through pharynx and pulmonary tracts. Airway protection appears inadequate with weak efforts when swallowing and delayed coughs/throat clears. Vocal quality hoarse, wet with decreased ability to phonate. After oral care and upright position, may give several ice chips per shift to maintain moist oral cavity. Will continue to follow for appropriateness for instrumental study.    HPI HPI: Chad Mcclure is a 63 y.o. male who has a PMH including but not limited to HTN, HLD, HCV, Left CVA (multiple infarcts involving cerebral white matter, thalami, and pons) DM, schizophrenia, dementia and who resides at [facility] in [place]. Admitted to [facility] on 03/18/19 with fever due PNA, required intubated on 8/21 for hypoxic respiratory failure, extubated 8/29 then required re-intubation 8/31 for staus epilepticus then transferred to [facility] 9/2 and self extubated 9/5.       SLP Plan  Continue with current plan of care       Recommendations  Diet recommendations: NPO Medication Administration: Via alternative means                Oral Care Recommendations: Oral care QID SLP Visit Diagnosis: Dysphagia, unspecified  (R13.10) Plan: Continue with current plan of care                       Houston Siren 04/08/2019, 9:30 AM  Orbie Pyo Colvin Caroli.Ed Risk analyst [phone] Office [phone]

## 2019-04-08 NOTE — Progress Notes (Addendum)
Called Dr. Tawanna Solo today because he has a lot of mucous and NG was taken by patient last night and this morning.  We tried to advance it twice and both abdominal xrays showed it at the end of the esophagus.  Dr. Michela Pitcher we can stop tube feedings and take out tube until IR can replace tomorrow.  Also he said we can put in a order for NT suctioning by respiratory.  He is still getting Dextrose at 150 ml per hour.

## 2019-04-08 NOTE — Progress Notes (Addendum)
Physical Therapy Treatment Patient Details Name: Chad Mcclure MRN: [id number] DOB: [birth year] Today's Date: 04/08/2019    History of Present Illness Chad Mcclure is a 63 y.o. male who has a PMH including but not limited to HTN, HLD, HCV, Left CVA, DM, schizophrenia, dementia and who resides at [facility] in [place].  He was admitted to [facility] on 03/18/19 with fever due PNA, required intubated on 8/21 for hypoxic respiratory failure, extubated 8/29 then required re-intubation 8/31 for staus epilepticus then transferred to [facility] 9/2.    PT Comments     Pt nonverbal, following 25% of simple commands with delay and when provided repetition. Bed placed in chair position to promote upright. Session focused on left sided passive range of motion and right sided active assisted strengthening. Noted increased LUE tone. D/c plan remains appropriate.    Follow Up Recommendations  SNF     Equipment Recommendations  None recommended by PT    Recommendations for Other Services       Precautions / Restrictions Precautions Precautions: Fall;Other (comment) Precaution Comments: NG tube Restrictions Weight Bearing Restrictions: No    Mobility  Bed Mobility               General bed mobility comments: Placed bed in chair position  Transfers                 General transfer comment: deferred  Ambulation/Gait                 Stairs             Wheelchair Mobility    Modified Rankin (Stroke Patients Only)       Balance                                            Cognition Arousal/Alertness: Awake/alert Behavior During Therapy: Flat affect Overall Cognitive Status: Impaired/Different from baseline                                 General Comments: following simple commands < 25% of the times, drowsy towards end of session      Exercises General Exercises - Lower Extremity Ankle Circles/Pumps: PROM;Left;10  reps Heel Slides: AAROM;Right;10 reps;Supine Hip Flexion/Marching: AAROM;Right;10 reps;Supine Other Exercises Other Exercises: Supine: LUE PROM D1/D2 exercises x 10 each Other Exercises: Supine: LLE PROM D1/D2 x 10 each    General Comments General comments (skin integrity, edema, etc.): VSS on 5L O2      Pertinent Vitals/Pain Pain Assessment: Faces Faces Pain Scale: No hurt Pain Location: pt shifting trunk to the side with attempts at LUE shoulder flexion Pain Intervention(s): Monitored during session    Home Living                      Prior Function            PT Goals (current goals can now be found in the care plan section) Acute Rehab PT Goals Patient Stated Goal: unable PT Goal Formulation: Patient unable to participate in goal setting Time For Goal Achievement: 04/19/19 Potential to Achieve Goals: Fair    Frequency    Min 2X/week      PT Plan Current plan remains appropriate    Co-evaluation  AM-PAC PT "6 Clicks" Mobility   Outcome Measure  Help needed turning from your back to your side while in a flat bed without using bedrails?: A Lot Help needed moving from lying on your back to sitting on the side of a flat bed without using bedrails?: A Lot Help needed moving to and from a bed to a chair (including a wheelchair)?: A Lot Help needed standing up from a chair using your arms (e.g., wheelchair or bedside chair)?: Total Help needed to walk in hospital room?: Total Help needed climbing 3-5 steps with a railing? : Total 6 Click Score: 9    End of Session Equipment Utilized During Treatment: Oxygen Activity Tolerance: Patient tolerated treatment well(VSS) Patient left: in bed;with call bell/phone within reach;with bed alarm set Nurse Communication: Mobility status PT Visit Diagnosis: Unsteadiness on feet (R26.81);Muscle weakness (generalized) (M62.81);Difficulty in walking, not elsewhere classified (R26.2)     Time:  [id number] PT Time Calculation (min) (ACUTE ONLY): 19 min  Charges:  $Therapeutic Exercise: 8-22 mins                     Laurina Bustlearoline Jamilex Bohnsack, PT, DPT Acute Rehabilitation Services Pager [phone] Office [phone]    Vanetta MuldersCarloine H Moe Brier 04/08/2019, 2:30 PM

## 2019-04-09 ENCOUNTER — Inpatient Hospital Stay (HOSPITAL_COMMUNITY): Payer: Medicaid Other

## 2019-04-09 LAB — BASIC METABOLIC PANEL
Anion gap: 9 (ref 5–15)
BUN: 46 mg/dL — ABNORMAL HIGH (ref 8–23)
CO2: 22 mmol/L (ref 22–32)
Calcium: 8.3 mg/dL — ABNORMAL LOW (ref 8.9–10.3)
Chloride: 117 mmol/L — ABNORMAL HIGH (ref 98–111)
Creatinine, Ser: 2.98 mg/dL — ABNORMAL HIGH (ref 0.61–1.24)
GFR calc Af Amer: 25 mL/min — ABNORMAL LOW (ref >60)
GFR calc non Af Amer: 21 mL/min — ABNORMAL LOW (ref >60)
Glucose, Bld: 142 mg/dL — ABNORMAL HIGH (ref 70–99)
Potassium: 3.4 mmol/L — ABNORMAL LOW (ref 3.5–5.1)
Sodium: 148 mmol/L — ABNORMAL HIGH (ref 135–145)

## 2019-04-09 LAB — GLUCOSE, CAPILLARY
Glucose-Capillary: 113 mg/dL — ABNORMAL HIGH (ref 70–99)
Glucose-Capillary: 118 mg/dL — ABNORMAL HIGH (ref 70–99)
Glucose-Capillary: 119 mg/dL — ABNORMAL HIGH (ref 70–99)
Glucose-Capillary: 120 mg/dL — ABNORMAL HIGH (ref 70–99)
Glucose-Capillary: 127 mg/dL — ABNORMAL HIGH (ref 70–99)
Glucose-Capillary: 159 mg/dL — ABNORMAL HIGH (ref 70–99)

## 2019-04-09 LAB — OSMOLALITY, URINE: Osmolality, Ur: 313 mOsm/kg (ref 300–900)

## 2019-04-09 MED ORDER — CHLORHEXIDINE GLUCONATE CLOTH 2 % EX PADS
6.0000 | MEDICATED_PAD | Freq: Every day | CUTANEOUS | Status: DC
  Administered 2019-04-10 – 2019-04-12 (×4): 6 via TOPICAL

## 2019-04-09 MED ORDER — LACOSAMIDE 10 MG/ML PO SOLN
100.0000 mg | Freq: Two times a day (BID) | ORAL | Status: DC
  Administered 2019-04-09: 100 mg via ORAL
  Filled 2019-04-09 (×2): qty 10

## 2019-04-09 MED ORDER — CHLORHEXIDINE GLUCONATE 0.12 % MT SOLN
15.0000 mL | Freq: Two times a day (BID) | OROMUCOSAL | Status: DC
  Administered 2019-04-09 – 2019-04-24 (×25): 15 mL via OROMUCOSAL
  Filled 2019-04-09 (×22): qty 15

## 2019-04-09 MED ORDER — LEVETIRACETAM 100 MG/ML PO SOLN
500.0000 mg | Freq: Two times a day (BID) | ORAL | Status: DC
  Administered 2019-04-09 – 2019-04-16 (×14): 500 mg via ORAL
  Filled 2019-04-09 (×14): qty 5

## 2019-04-09 MED ORDER — LEVETIRACETAM 500 MG PO TABS
500.0000 mg | ORAL_TABLET | Freq: Two times a day (BID) | ORAL | Status: DC
  Filled 2019-04-09: qty 1

## 2019-04-09 MED ORDER — POTASSIUM CHLORIDE 20 MEQ PO PACK
40.0000 meq | PACK | Freq: Once | ORAL | Status: AC
  Administered 2019-04-09: 40 meq via ORAL
  Filled 2019-04-09: qty 2

## 2019-04-09 MED ORDER — ORAL CARE MOUTH RINSE
15.0000 mL | Freq: Two times a day (BID) | OROMUCOSAL | Status: DC
  Administered 2019-04-11 – 2019-04-24 (×20): 15 mL via OROMUCOSAL

## 2019-04-09 NOTE — Progress Notes (Signed)
  CSW acknowledges consult for SNF placement . Patient can not go to SNF with Cortrak. CSW will continue to follow and assist with discharge planning .   Thurmond Butts, [place] Social Worker [phone]

## 2019-04-09 NOTE — Procedures (Signed)
Cortrak  Person Inserting Tube:  Esaw Dace, RD Tube Type:  Cortrak - 43 inches Tube Location:  Right nare Initial Placement:  Stomach Secured by: Bridle Cortrak Secured At:  60 cm Procedure Comments:  Cortrak Tube Team Note:  Consult received to place a Cortrak feeding tube.    X-ray is required, abdominal x-ray has been ordered by the Cortrak team. Previous NG placement in esophagus and unable to advance. Obtain xray to confirm gastric placementPlease confirm tube placement before using the Cortrak tube.   If the tube becomes dislodged please keep the tube and contact the Cortrak team at [email or web address] (password [email or web address]) for replacement.  If after hours and replacement cannot be delayed, place a NG tube and confirm placement with an abdominal x-ray.    [employer] MS, RDN, LDN, CNSC [phone] Pager  [phone] Weekend/On-Call Pager

## 2019-04-09 NOTE — TOC Initial Note (Signed)
Transition of Care [facility]) - Initial/Assessment Note    Patient Details  Name: Chad Mcclure MRN: [id number] Date of Birth: 07-31-1955  Transition of Care [facility]) CM/SW Contact:    Vinie Sill, [place] Phone Number: 04/09/2019, 5:44 PM  Clinical Narrative:                  CSW spoke with the patients father, Mariea Clonts. CSW introduced self and explained role. Patient's father states the patient is from [facility] and [facility] in [place], [place]. He agrees with the plan for the patient to return to [facility] once he is medically stable.   [place] confirmed the patient is from there- Admission coordinator is Sheran Luz [phone]  CSW called the patient's daughter, Monica-mo answer-CSW left voice message to return call.   Thurmond Butts, MSW, [facility] Clinical Social Worker [phone]     Expected Discharge Plan: Skilled Nursing Facility Barriers to Discharge: Continued Medical Work up   Patient Goals and CMS Choice Patient states their goals for this hospitalization and ongoing recovery are:: return badk to [facility] and Rehab      Expected Discharge Plan and Services Expected Discharge Plan: [place]       Living arrangements for the past 2 months: [place]                                      Prior Living Arrangements/Services Living arrangements for the past 2 months: [place] Lives with:: Facility Resident Patient language and need for interpreter reviewed:: Yes        Need for Family Participation in Patient Care: Yes (Comment) Care giver support system in place?: Yes (comment)   Criminal Activity/Legal Involvement Pertinent to Current Situation/Hospitalization: No - Comment as needed  Activities of Daily Living   ADL Screening (condition at time of admission) Independently performs ADLs?: No Weakness of Legs: Left Weakness of Arms/Hands: Left  Permission  Sought/Granted Permission sought to share information with : Family Supports, Chartered certified accountant granted to share information with : Yes, Verbal Permission Granted  Share Information with NAME: Arrington Yohe (daughter) and Nicole Cella (father)  Permission granted to share info w AGENCY: [employer] and [place] granted to share info w Relationship: daughter and father  Permission granted to share info w Sport and exercise psychologist Information: [name] (dauhghter ) [phone]  Emotional Assessment   Attitude/Demeanor/Rapport: Unable to Assess Affect (typically observed): Unable to Assess Orientation: : Oriented to Self Alcohol / Substance Use: Not Applicable Psych Involvement: No (comment)  Admission diagnosis:  Status Epilepticus  Patient Active Problem List   Diagnosis Date Noted  . Aspiration pneumonia ([place]) 04/01/2019  . CVA (cerebral vascular accident) ([place]) 03/31/2019  . Acute respiratory failure with hypoxia ([place])   . Status epilepticus ([place])   . AKI (acute kidney injury) ([place])   . Encephalopathy acute   . Seizures ([place])    PCP:  Patient, No Pcp Per Pharmacy:  No Pharmacies Listed    Social Determinants of Health (SDOH) Interventions    Readmission Risk Interventions No flowsheet data found.

## 2019-04-09 NOTE — Progress Notes (Signed)
Neurology Progress Note   S:// Seen and examined No acute changes   O:// Current vital signs: BP (!) 144/89 (BP Location: Left Arm)   Pulse 79   Temp 97.8 F (36.6 C) (Axillary)   Resp 19   Ht 6' (1.829 m)   Wt 85.2 kg   SpO2 95%   BMI 25.47 kg/m  Vital signs in last 24 hours: Temp:  [97.8 F (36.6 C)-98.8 F (37.1 C)] 97.8 F (36.6 C) (09/11 0800) Pulse Rate:  [66-99] 79 (09/11 0800) Resp:  [15-20] 19 (09/11 0800) BP: (140-154)/(79-89) 144/89 (09/11 0800) SpO2:  [93 %-98 %] 95 % (09/11 0800) FiO2 (%):  [40 %] 40 % (09/10 2044) Neurological exam Awake, alert, does not seem to be in any apparent distress On asking how he is doing he said okay. He was able to tell me his name although he is very hypophonic. He was able to mimic-raising his arm but did not really consistently follow commands Cranial nerves: Pupils are equal round react light, extraocular movements intact, visual fields appear full as he blinks to threat from both sides, face appears symmetric. Motor exam: Right upper and lower extremity are antigravity and is able to hold them without vertical drift.  He is able to hold the left lower extremity antigravity without drift for about 5 seconds as well.  Left upper extremity has barely some movement at the shoulder 1/5. Sensory exam: Grimaces to noxious stimulation all over Cannot assess coordination due to his current mentation  Medications  Current Facility-Administered Medications:  .  0.9 %  sodium chloride infusion, , Intravenous, PRN, Renee Pain, MD, Stopped at 04/02/19 0029 .  0.9 %  sodium chloride infusion, , Intravenous, PRN, Deterding, Guadelupe Sabin, MD, Last Rate: 10 mL/hr at 04/04/19 2000 .  acetaminophen (TYLENOL) solution 500 mg, 500 mg, Per Tube, Q6H PRN, Margaretha Seeds, MD, 500 mg at 04/04/19 1650 .  amLODipine (NORVASC) tablet 10 mg, 10 mg, Per Tube, Daily, Elsie Lincoln, MD, 10 mg at 04/08/19 1020 .  chlorhexidine gluconate (MEDLINE  KIT) (PERIDEX) 0.12 % solution 15 mL, 15 mL, Mouth Rinse, BID, Desai, Rahul P, PA-C, 15 mL at 04/09/19 0800 .  Chlorhexidine Gluconate Cloth 2 % PADS 6 each, 6 each, Topical, Daily, Desai, Rahul P, PA-C, 6 each at 04/08/19 2230 .  dextrose 5 % solution, , Intravenous, Continuous, Sanford, Ryan B, MD, Last Rate: 150 mL/hr at 04/08/19 1941 .  feeding supplement (PRO-STAT SUGAR FREE 64) liquid 30 mL, 30 mL, Per Tube, TID, Halford Chessman, Vineet, MD, 30 mL at 04/08/19 1020 .  feeding supplement (VITAL 1.5 CAL) liquid 1,000 mL, 1,000 mL, Per Tube, Continuous, Sood, Vineet, MD, Last Rate: 50 mL/hr at 04/06/19 1025, 1,000 mL at 04/06/19 1025 .  fentaNYL (SUBLIMAZE) injection 50 mcg, 50 mcg, Intravenous, Q15 min PRN, Desai, Rahul P, PA-C, 50 mcg at 04/07/19 0352 .  fentaNYL (SUBLIMAZE) injection 50-200 mcg, 50-200 mcg, Intravenous, Q30 min PRN, Desai, Rahul P, PA-C, 100 mcg at 04/04/19 0334 .  free water 400 mL, 400 mL, Per Tube, Q3H, Pearson Grippe B, MD, 400 mL at 04/08/19 1200 .  heparin injection 5,000 Units, 5,000 Units, Subcutaneous, Q8H, Desai, Rahul P, PA-C, 5,000 Units at 04/09/19 0434 .  hydrALAZINE (APRESOLINE) injection 10 mg, 10 mg, Intravenous, Q4H PRN, Margaretha Seeds, MD, 10 mg at 04/04/19 0921 .  insulin aspart (novoLOG) injection 0-9 Units, 0-9 Units, Subcutaneous, Q4H, Desai, Rahul P, PA-C, 1 Units at 04/08/19 1700 .  lacosamide (  VIMPAT) 100 mg in sodium chloride 0.9 % 25 mL IVPB, 100 mg, Intravenous, Q12H, Desai, Rahul P, PA-C, Last Rate: 70 mL/hr at 04/09/19 1019, 100 mg at 04/09/19 1019 .  levETIRAcetam (KEPPRA) IVPB 500 mg/100 mL premix, 500 mg, Intravenous, Q12H, Desai, Rahul P, PA-C, Last Rate: 400 mL/hr at 04/09/19 0000, 500 mg at 04/09/19 0000 .  LORazepam (ATIVAN) injection 1-4 mg, 1-4 mg, Intravenous, Q2H PRN, Desai, Rahul P, PA-C, 1 mg at 04/09/19 0020 .  MEDLINE mouth rinse, 15 mL, Mouth Rinse, 10 times per day, Desai, Rahul P, PA-C, 15 mL at 04/09/19 1016 .  pantoprazole sodium  (PROTONIX) 40 mg/20 mL oral suspension 40 mg, 40 mg, Per Tube, Daily, Agarwala, Ravi, MD, 40 mg at 04/08/19 1020 .  sodium chloride flush (NS) 0.9 % injection 10-40 mL, 10-40 mL, Intracatheter, PRN, Sood, Vineet, MD .  vancomycin variable dose per unstable renal function (pharmacist dosing), , Does not apply, See admin instructions, Rush Farmer, MD Labs CBC    Component Value Date/Time   WBC 7.7 04/08/2019 1202   RBC 4.24 04/08/2019 1202   HGB 11.6 (L) 04/08/2019 1202   HCT 35.9 (L) 04/08/2019 1202   PLT 205 04/08/2019 1202   MCV 84.7 04/08/2019 1202   MCH 27.4 04/08/2019 1202   MCHC 32.3 04/08/2019 1202   RDW 16.0 (H) 04/08/2019 1202   LYMPHSABS 1.8 04/08/2019 1202   MONOABS 0.6 04/08/2019 1202   EOSABS 0.2 04/08/2019 1202   BASOSABS 0.0 04/08/2019 1202    CMP     Component Value Date/Time   NA 148 (H) 04/09/2019 0833   K 3.4 (L) 04/09/2019 0833   CL 117 (H) 04/09/2019 0833   CO2 22 04/09/2019 0833   GLUCOSE 142 (H) 04/09/2019 0833   BUN 46 (H) 04/09/2019 0833   CREATININE 2.98 (H) 04/09/2019 0833   CALCIUM 8.3 (L) 04/09/2019 0833   PROT 6.7 03/31/2019 2317   ALBUMIN 2.0 (L) 04/06/2019 2140   AST 63 (H) 03/31/2019 2317   ALT 81 (H) 03/31/2019 2317   ALKPHOS 52 03/31/2019 2317   BILITOT 0.8 03/31/2019 2317   GFRNONAA 21 (L) 04/09/2019 0833   GFRAA 25 (L) 04/09/2019 4580    Imaging I have reviewed images in epic and the results pertinent to this consultation are: No new imaging to review  Assessment: 63 year old with persistent encephalopathy status post seizure and possible respiratory failure due to aspiration pneumonia transferred from an outside hospital [place] out of state [place] higher level of care.  Has reported history of underlying dementia and schizophrenia.  Details on what his baseline is unclear at this time.  I have been not able to reach the family yet but will attempt if I am unable to get this history from the primary team. EEG on 04/01/2019 with severe  encephalopathy no seizures. Likely has encephalopathy is multifactorial-toxic metabolic and seizures along with the underlying psychiatric illness and neurodegenerative disease-dementia. Continues to have some hyponatremia which is improving.  Recommendations: Keppra 500 twice daily Vimpat 100 twice daily Management of toxic metabolic derangements as you are PT OT Neurology will sign off at this time.  Please call with questions as needed.    -- Amie Portland, MD [facility] Pager: [phone] If 7pm to 7am, please call on call as listed on AMION.

## 2019-04-09 NOTE — Progress Notes (Signed)
PROGRESS NOTE    Chad Mcclure  YEM:[id number] DOB: [birth year] DOA: 03/31/2019 PCP: Patient, No Pcp Per   Brief Narrative:  Patient is a 63 year old male with H/O HTN, HLD, HCV, Left CVA, DM, schizophrenia, dementia and who resides at SNF in [place] , who initially was admitted to [facility] on 8/20 with fevers.  He was suspected to have pneumonia and cystitis.  He required intubation on 8/21 for acute hypoxic respiratory failure.  Extubated on 8/29 did not require intubation on 8/31 for status epilepticus. He required initiation of hemodialysis after failing a lasix challenge, TTS schedule planned (R subclavian perm cath placed). He was transferred to [facility] on 9/2 and was under PCCM service.  He has been extubated now and transferred to hospitalist team on 04/06/2019.  He was managed for MRSA associated pneumonia, seizures.  Hospital course remarkable for persistent encephalopathy but slowly improving now.  Nephrology following for dialysis.  Neurology was also following.  Assessment & Plan:   Principal Problem:   Acute respiratory failure with hypoxia (HCC) Active Problems:   Status epilepticus (HCC)   AKI (acute kidney injury) ([place])   Encephalopathy acute   Aspiration pneumonia (HCC)   Encephalopathy: Secondary to seizures, ICU delirium, sedating medicines.  Neurology was following.  EEG on 04/01/2019 showed severe encephalopathy but no seizures.  Prolonged encephalopathy most likely secondary to prolonged seizure episodes/status epilepticus. Mental status is improving.  He started communicating.  He follows commands.  He knows that he is in the hospital.But looks strongly deconditioned/debilitated and weak.  Acute hypoxic respiratory failure:Treated for MRSA pneumonia.  Had to be intubated twice.  Self extubated on 9/5.  Currently respiratory status stable on nasal cannula.  On 5 L of oxygen per minute. Continue pulmonary toileting.  MRSA pneumonia: Completed  the course of  vancomycin.  Currently respiratory status is stable.   Seizure disorder: On Vimpat, Keppra.  Neurology was following.  AKI suspected secondary to contrast nephropathy: Nephrology following.  Started on dialysis on 8/24 at [facility]. He is having good urine output.  Expecting full renal recovery.  Nephrology following.  He had permanent cath on the right chest which has been removed.  Hypernatremia:Improving .Continue free water and D5W  Hypertension: Continue blood pressure medicines.  Continue to monitor blood pressure  Dysphagia/protein calorie malnutrition: Dietitian following.  Currently on tube feeding. Speech therapy continues to recommend NPO.Anticipate improvement so that he can be fed by mouth.  Diabetes mellitus: Continue sliding scale insulin.  History of schizophrenia/dementia: Continue supportive care.  History of left CVA: Has residual weakness on the left side.  Skilled nursing facility resident.He had slurred speech on baseline.  Debility/deconditioning: PT/OT consulted and recommended skilled nursing facility.  Social worker made aware.  When he is medically ready for discharge, he will be discharged back to skilled nursing facility at [facility].  Nutrition Problem: Inadequate oral intake Etiology: inability to eat      DVT prophylaxis: Heparin [place] Code Status: Full Family Communication: Called and talked to daughter on 03/08/19 Disposition Plan: Back to skilled nursing facility at [facility] when medically stable.Still on tube feeding.   Consultants: PCCM, neurology, nephrology  Procedures: Intubation, extubation, dialysis catheter placement  Antimicrobials:  Anti-infectives (From admission, onward)   Start     Dose/Rate Route Frequency Ordered Stop   04/07/19 1000  vancomycin (VANCOCIN) 500 mg in sodium chloride 0.9 % 100 mL IVPB     500 mg 100 mL/hr over 60 Minutes Intravenous  Once 04/07/19 0835 04/07/19  1026   04/04/19 0830  vancomycin (VANCOCIN) 1,500 mg in  sodium chloride 0.9 % 500 mL IVPB     1,500 mg 250 mL/hr over 120 Minutes Intravenous  Once 04/04/19 0810 04/04/19 1127   04/03/19 1200  vancomycin (VANCOCIN) IVPB 1000 mg/200 mL premix  Status:  Discontinued     1,000 mg 200 mL/hr over 60 Minutes Intravenous Every T-Th-Sa (Hemodialysis) 04/01/19 1253 04/03/19 0744   04/03/19 0746  vancomycin variable dose per unstable renal function (pharmacist dosing)      Does not apply See admin instructions 04/03/19 0746     04/01/19 1400  vancomycin (VANCOCIN) 2,000 mg in sodium chloride 0.9 % 500 mL IVPB     2,000 mg 250 mL/hr over 120 Minutes Intravenous  Once 04/01/19 1253 04/01/19 1900   04/01/19 0800  meropenem (MERREM) 500 mg in sodium chloride 0.9 % 100 mL IVPB  Status:  Discontinued     500 mg 200 mL/hr over 30 Minutes Intravenous Every 24 hours 04/01/19 0059 04/03/19 0757   03/31/19 2315  piperacillin-tazobactam (ZOSYN) IVPB 3.375 g     3.375 g 100 mL/hr over 30 Minutes Intravenous  Once 03/31/19 2300 04/01/19 0031      Subjective:   Patient seen and examined the bedside this morning.  Hemodynamically stable.  He pulled out his feeding tube yesterday evening.  New feeding tube needs to be placed.  His mental status continues to improve.  He is alert, awake and tries to communicate and answers by 1 word.  Follows commands.  Still looks very weak and deconditioned   Objective: Vitals:   04/09/19 0332 04/09/19 0400 04/09/19 0800 04/09/19 1150  BP: (!) 142/83 (!) 154/80 (!) 144/89 (!) 147/94  Pulse: 78 87 79 95  Resp: 19 15 19 18   Temp: 98.4 F (36.9 C)  97.8 F (36.6 C) 98.7 F (37.1 C)  TempSrc: Axillary  Axillary Axillary  SpO2: 98% 96% 95% 92%  Weight:      Height:        Intake/Output Summary (Last 24 hours) at 04/09/2019 1309 Last data filed at 04/09/2019 0800 Gross per 24 hour  Intake 3492.24 ml  Output 3050 ml  Net 442.24 ml   Filed Weights   04/06/19 0500 04/07/19 0500 04/08/19 0500  Weight: 84.4 kg 84.4 kg 85.2 kg     Examination:  General exam: Not in distress, extremely deconditioned/debilitated HEENT:Oral mucosa moist, Ear/Nose normal on gross exam Respiratory system: Bilateral decreased air entry in the bases.  No wheezes or crackles auscultated Cardiovascular system: S1 & S2 heard, RRR. No JVD, murmurs, rubs, gallops or clicks. No pedal edema. Gastrointestinal system: Abdomen is nondistended, soft and nontender. No organomegaly or masses felt. Normal bowel sounds heard. Central nervous system:  Left residual hemiparesis with contractures .   Alert and awake,oriented to place extremities: No edema, no clubbing ,no cyanosis, distal peripheral pulses palpable. Skin: No rashes, lesions or ulcers,no icterus ,no pallor     Data Reviewed: I have personally reviewed following labs and imaging studies  CBC: Recent Labs  Lab 04/03/19 0511 04/03/19 0521 04/04/19 0435 04/06/19 0536 04/08/19 1202  WBC  --  7.2  --  7.6 7.7  NEUTROABS  --   --   --   --  5.2  HGB 9.9* 9.2* 9.2* 11.2* 11.6*  HCT 29.0* 28.3* 27.0* 35.9* 35.9*  MCV  --  83.5  --  84.7 84.7  PLT  --  316  --  301 205  Basic Metabolic Panel: Recent Labs  Lab 04/02/19 1611  04/03/19 0521  04/04/19 0452  04/06/19 2140 04/07/19 0520 04/07/19 1341 04/08/19 1202 04/08/19 1929 04/09/19 0833  NA  --    < > 151*   < > 155*   < > 156* 158* 157* 152* 150* 148*  K  --    < > 3.6   < > 3.8   < > 3.5 3.8 3.6 3.4* 3.8 3.4*  CL  --    < > 117*  --  122*   < > 124* 126* 125* 121* 120* 117*  CO2  --    < > 21*  --  24   < > 21* 23 23 21* 18* 22  GLUCOSE  --    < > 135*  --  130*   < > 149* 177* 177* 184* 151* 142*  BUN  --    < > 65*  --  64*   < > 69* 63* 64* 54* 52* 46*  CREATININE  --    < > 5.92*  --  5.17*   < > 4.05* 3.76* 3.31* 3.01* 2.97* 2.98*  CALCIUM  --    < > 8.3*  --  8.3*   < > 8.5* 8.8* 8.4* 8.3* 8.2* 8.3*  MG 2.3  --  2.1  --   --   --   --   --   --   --   --   --   PHOS 4.6  --  4.6  --  4.7*  --  3.4  --   --   --    --   --    < > = values in this interval not displayed.   GFR: Estimated Creatinine Clearance: 27.8 mL/min (A) (by C-G formula based on SCr of 2.98 mg/dL (H)). Liver Function Tests: Recent Labs  Lab 04/04/19 0452 04/06/19 2140  ALBUMIN 1.9* 2.0*   No results for input(s): LIPASE, AMYLASE in the last 168 hours. Recent Labs  Lab 04/02/19 2030  AMMONIA 17   Coagulation Profile: No results for input(s): INR, PROTIME in the last 168 hours. Cardiac Enzymes: No results for input(s): CKTOTAL, CKMB, CKMBINDEX, TROPONINI in the last 168 hours. BNP (last 3 results) No results for input(s): PROBNP in the last 8760 hours. HbA1C: No results for input(s): HGBA1C in the last 72 hours. CBG: Recent Labs  Lab 04/08/19 2035 04/08/19 2337 04/09/19 0337 04/09/19 0810 04/09/19 1147  GLUCAP 148* 107* 113* 119* 120*   Lipid Profile: No results for input(s): CHOL, HDL, LDLCALC, TRIG, CHOLHDL, LDLDIRECT in the last 72 hours. Thyroid Function Tests: No results for input(s): TSH, T4TOTAL, FREET4, T3FREE, THYROIDAB in the last 72 hours. Anemia Panel: No results for input(s): VITAMINB12, FOLATE, FERRITIN, TIBC, IRON, RETICCTPCT in the last 72 hours. Sepsis Labs: No results for input(s): PROCALCITON, LATICACIDVEN in the last 168 hours.  Recent Results (from the past 240 hour(s))  MRSA PCR Screening     Status: Abnormal   Collection Time: 03/31/19  9:42 PM   Specimen: Nasal Mucosa; Nasopharyngeal  Result Value Ref Range Status   MRSA by PCR POSITIVE (A) NEGATIVE Final    Comment:        The GeneXpert MRSA Assay (FDA approved for NASAL specimens only), is one component of a comprehensive MRSA colonization surveillance program. It is not intended to diagnose MRSA infection nor to guide or monitor treatment for MRSA infections. RESULT CALLED TO, READ BACK BY AND VERIFIED  WITH: Dixon Boos RN [id number] 03/31/19 A BROWNING Performed at [facility], [place] [address], [place], [place] [address]     Culture, respiratory (non-expectorated)     Status: None   Collection Time: 04/01/19 11:30 AM   Specimen: Tracheal Aspirate; Respiratory  Result Value Ref Range Status   Specimen Description TRACHEAL ASPIRATE  Final   Special Requests NONE  Final   Gram Stain   Final    FEW WBC PRESENT, PREDOMINANTLY PMN MODERATE GRAM POSITIVE COCCI IN PAIRS IN CLUSTERS Performed at [facility], [address], [place], [place] [address]    Culture   Final    ABUNDANT METHICILLIN RESISTANT STAPHYLOCOCCUS AUREUS   Report Status 04/03/2019 FINAL  Final   Organism ID, Bacteria METHICILLIN RESISTANT STAPHYLOCOCCUS AUREUS  Final      Susceptibility   Methicillin resistant staphylococcus aureus - MIC*    CIPROFLOXACIN >=8 RESISTANT Resistant     ERYTHROMYCIN >=8 RESISTANT Resistant     GENTAMICIN <=0.5 SENSITIVE Sensitive     OXACILLIN >=4 RESISTANT Resistant     TETRACYCLINE 2 SENSITIVE Sensitive     VANCOMYCIN 1 SENSITIVE Sensitive     TRIMETH/SULFA <=10 SENSITIVE Sensitive     CLINDAMYCIN >=8 RESISTANT Resistant     RIFAMPIN <=0.5 SENSITIVE Sensitive     Inducible Clindamycin NEGATIVE Sensitive     * ABUNDANT METHICILLIN RESISTANT STAPHYLOCOCCUS AUREUS         Radiology Studies: Dg Chest 1 View  Result Date: 04/08/2019 CLINICAL DATA:  Aspiration. EXAM: CHEST  1 VIEW COMPARISON:  04/03/2019 FINDINGS: The endotracheal tube has been removed. The right jugular catheter has also been removed. A nasogastric tube remains in place with its side hole in the mid esophagus and tip in the distal esophagus. A poor inspiration is again demonstrated with mild right basilar atelectasis. Patchy opacity in the left mid and lower lung zones without significant change. No pleural fluid seen. Unremarkable bones. IMPRESSION: 1. Stable probable pneumonia in the left mid and lower lung zones. 2. Poor inspiration with mild right basilar atelectasis. 3. Nasogastric tube tip and side hole in the esophagus. It is  recommended that this be advanced. Electronically Signed   By: Claudie Revering M.D.   On: 04/08/2019 17:51   Dg Abd 1 View  Result Date: 04/08/2019 CLINICAL DATA:  Nasogastric tube placement. EXAM: ABDOMEN - 1 VIEW COMPARISON:  Earlier today. Chest radiograph obtained at the same time. FINDINGS: Nasogastric tube tip in the distal esophagus and side hole in the mid esophagus. Normal bowel gas pattern. Lung changes described on the portable chest obtained at the same time. Multiple metallic densities overlying the upper right pelvis. Lumbar and lower thoracic spine degenerative changes. IMPRESSION: Nasogastric tube tip in the distal esophagus and side hole in the mid esophagus. It is recommended that this be advanced. Electronically Signed   By: Claudie Revering M.D.   On: 04/08/2019 17:53   Dg Abd 1 View  Result Date: 04/08/2019 CLINICAL DATA:  Check nasogastric catheter EXAM: ABDOMEN - 1 VIEW COMPARISON:  04/07/2019 FINDINGS: Scattered large and small bowel gas is noted. Previously seen nasogastric catheter has been withdrawn and now lies in the distal esophagus. This should be advanced several cm further into the stomach. IMPRESSION: Nasogastric catheter within the distal esophagus. This should be advanced further into the stomach. Electronically Signed   By: Inez Catalina M.D.   On: 04/08/2019 15:43   Dg Abd 1 View  Result Date: 04/07/2019 CLINICAL  DATA:  NG tube placement EXAM: ABDOMEN - 1 VIEW COMPARISON:  04/04/2019 FINDINGS: NG tube tip is in the mid to distal stomach. This has been slightly advanced since prior study. Nonobstructive bowel gas pattern. IMPRESSION: NG tube tip in the mid to distal stomach. Electronically Signed   By: Rolm Baptise M.D.   On: 04/07/2019 23:40   Ir Removal Tun Cv Cath W/o Fl  Result Date: 04/07/2019 INDICATION: Patient recently admitted to [facility] on 03/18/2019 for management of fever. His hospital course was complicated by hypoxic respiratory failure requiring  intubation, status epilepticus, and AKI s/p tunneled right subclavian HD catheter placement at [facility] 03/22/2019. He was transferred to [facility] 03/31/2023 escalation of care. Since, renal function has improved and patient no longer requires hemodialysis. Request is made for removal of tunneled hemodialysis catheter. EXAM: REMOVAL OF TUNNELED HEMODIALYSIS CATHETER MEDICATIONS: None COMPLICATIONS: None immediate. PROCEDURE: Informed written consent was obtained from the patient's daughter, Omega Slager, via telephone following an explanation of the procedure, risks, benefits and alternatives to treatment. A time out was performed prior to the initiation of the procedure. Maximal barrier sterile technique was utilized including mask, sterile gowns, sterile gloves, large sterile drape, hand hygiene, and Hibiclens. Utilizing gentle traction, the catheter was removed intact. Hemostasis was obtained with manual compression. A dressing was placed. The patient tolerated the procedure well without immediate post procedural complication. IMPRESSION: Successful removal of tunneled hemodialysis catheter. Read by: Earley Abide, PA-C Electronically Signed   By: Lucrezia Europe M.D.   On: 04/07/2019 14:49        Scheduled Meds:  amLODipine  10 mg Per Tube Daily   chlorhexidine  15 mL Mouth Rinse BID   chlorhexidine gluconate (MEDLINE KIT)  15 mL Mouth Rinse BID   Chlorhexidine Gluconate Cloth  6 each Topical Daily   feeding supplement (PRO-STAT SUGAR FREE 64)  30 mL Per Tube TID   free water  400 mL Per Tube Q3H   heparin  5,000 Units Subcutaneous Q8H   insulin aspart  0-9 Units Subcutaneous Q4H   lacosamide  100 mg Oral BID   levETIRAcetam  500 mg Oral BID   mouth rinse  15 mL Mouth Rinse 10 times per day   mouth rinse  15 mL Mouth Rinse q12n4p   pantoprazole sodium  40 mg Per Tube Daily   potassium chloride  40 mEq Oral Once   vancomycin variable dose per unstable renal function  (pharmacist dosing)   Does not apply See admin instructions   Continuous Infusions:  sodium chloride Stopped (04/02/19 0029)   sodium chloride 10 mL/hr at 04/04/19 2000   dextrose 150 mL/hr at 04/08/19 1941   feeding supplement (VITAL 1.5 CAL) 1,000 mL (04/06/19 1025)     LOS: 9 days    Time spent: 35 mins      Shelly Coss, MD [facility] Pager [phone]  If 7PM-7AM, please contact night-coverage [email or web address] Password [email or web address] 04/09/2019, 1:09 PM

## 2019-04-09 NOTE — Progress Notes (Signed)
  Speech Language Pathology Treatment: Dysphagia;Cognitive-Linquistic  Patient Details Name: Chad Mcclure MRN: [id number] DOB: [birth year] Today's Date: 04/09/2019 Time: 7680-8811 SLP Time Calculation (min) (ACUTE ONLY): 24 min  Assessment / Plan / Recommendation Clinical Impression  Less cues needed today to achieve and maintain alertness during therapy for dysphagia and speech intelligibility. He smiled several times at therapist's comments. Ice chip trials helped to loosen and initiate cough to mobilize secretions for suctioning. Oral care removed labial peeling. Diaphragmatic and oral weakness results in insufficient air to support functional phonation and decreased articulatory effort. Verbal instruction for greater inhale produced vocalization for 1 second. Continue NPO, oral care and ST follow up.       HPI HPI: Chad Mcclure is a 63 y.o. male who has a PMH including but not limited to HTN, HLD, HCV, Left CVA (multiple infarcts involving cerebral white matter, thalami, and pons) DM, schizophrenia, dementia and who resides at [facility] in [place]. Admitted to [facility] on 03/18/19 with fever due PNA, required intubated on 8/21 for hypoxic respiratory failure, extubated 8/29 then required re-intubation 8/31 for staus epilepticus then transferred to [facility] 9/2 and self extubated 9/5.       SLP Plan  Continue with current plan of care       Recommendations  Diet recommendations: NPO Medication Administration: Via alternative means                Oral Care Recommendations: Oral care QID Follow up Recommendations: Skilled Nursing facility SLP Visit Diagnosis: Dysarthria and anarthria (R47.1);Cognitive communication deficit (S31.594) Plan: Continue with current plan of care       GO                Houston Siren 04/09/2019, 3:59 PM  Orbie Pyo Colvin Caroli.Ed Risk analyst [phone] Office [phone]

## 2019-04-09 NOTE — Progress Notes (Signed)
[facility]    NEPHROLOGY PROGRESS NOTE  SUBJECTIVE:   Patient awake, confused, unable to provide review of systems.  Patient seen and examined early this morning.   OBJECTIVE:  Vitals:   04/09/19 1150 04/09/19 1525  BP: (!) 147/94   Pulse: 95   Resp: 18   Temp: 98.7 F (37.1 C) 98.1 F (36.7 C)  SpO2: 92%     Intake/Output Summary (Last 24 hours) at 04/09/2019 1731 Last data filed at 04/09/2019 0800 Gross per 24 hour  Intake -  Output 1750 ml  Net -1750 ml      General: Confused, NAD HEENT: MMM [place] AT anicteric sclera Neck:  No JVD, no adenopathy CV:  Heart RRR  Lungs: Coarse breath sounds bilaterally Abd:  abd SNT/ND with normal BS GU:  Bladder non-palpable Extremities: +1 bilateral lower extremity edema Skin:  No skin rash  MEDICATIONS:  . amLODipine  10 mg Per Tube Daily  . chlorhexidine  15 mL Mouth Rinse BID  . chlorhexidine gluconate (MEDLINE KIT)  15 mL Mouth Rinse BID  . Chlorhexidine Gluconate Cloth  6 each Topical Daily  . feeding supplement (PRO-STAT SUGAR FREE 64)  30 mL Per Tube TID  . free water  400 mL Per Tube Q3H  . heparin  5,000 Units Subcutaneous Q8H  . insulin aspart  0-9 Units Subcutaneous Q4H  . lacosamide  100 mg Oral BID  . levETIRAcetam  500 mg Oral BID  . mouth rinse  15 mL Mouth Rinse 10 times per day  . mouth rinse  15 mL Mouth Rinse q12n4p  . pantoprazole sodium  40 mg Per Tube Daily  . vancomycin variable dose per unstable renal function (pharmacist dosing)   Does not apply See admin instructions       LABS:   CBC Latest Ref Rng & Units 04/08/2019 04/06/2019 04/04/2019  WBC 4.0 - 10.5 K/uL 7.7 7.6 -  Hemoglobin 13.0 - 17.0 g/dL 11.6(L) 11.2(L) 9.2(L)  Hematocrit 39.0 - 52.0 % 35.9(L) 35.9(L) 27.0(L)  Platelets 150 - 400 K/uL 205 301 -    CMP Latest Ref Rng & Units 04/09/2019 04/08/2019 04/08/2019  Glucose 70 - 99 mg/dL 142(H) 151(H) 184(H)  BUN 8 - 23 mg/dL 46(H) 52(H) 54(H)  Creatinine 0.61 - 1.24 mg/dL  2.98(H) 2.97(H) 3.01(H)  Sodium 135 - 145 mmol/L 148(H) 150(H) 152(H)  Potassium 3.5 - 5.1 mmol/L 3.4(L) 3.8 3.4(L)  Chloride 98 - 111 mmol/L 117(H) 120(H) 121(H)  CO2 22 - 32 mmol/L 22 18(L) 21(L)  Calcium 8.9 - 10.3 mg/dL 8.3(L) 8.2(L) 8.3(L)  Total Protein 6.5 - 8.1 g/dL - - -  Total Bilirubin 0.3 - 1.2 mg/dL - - -  Alkaline Phos 38 - 126 U/L - - -  AST 15 - 41 U/L - - -  ALT 0 - 44 U/L - - -    Lab Results  Component Value Date   CALCIUM 8.3 (L) 04/09/2019   CAION 1.21 04/04/2019   PHOS 3.4 04/06/2019       Component Value Date/Time   COLORURINE STRAW (A) 04/01/2019 1424   APPEARANCEUR CLEAR 04/01/2019 1424   LABSPEC 1.006 04/01/2019 1424   PHURINE 6.0 04/01/2019 1424   GLUCOSEU NEGATIVE 04/01/2019 1424   HGBUR MODERATE (A) 04/01/2019 1424   BILIRUBINUR NEGATIVE 04/01/2019 1424   KETONESUR NEGATIVE 04/01/2019 1424   PROTEINUR 100 (A) 04/01/2019 1424   UROBILINOGEN 1.0 12/20/2010 2007   NITRITE NEGATIVE 04/01/2019 1424   LEUKOCYTESUR NEGATIVE 04/01/2019 1424  Component Value Date/Time   PHART 7.424 04/04/2019 0435   PCO2ART 35.7 04/04/2019 0435   PO2ART 74.0 (L) 04/04/2019 0435   HCO3 23.3 04/04/2019 0435   TCO2 24 04/04/2019 0435   ACIDBASEDEF 1.0 04/04/2019 0435   O2SAT 95.0 04/04/2019 0435    No results found for: IRON, TIBC, FERRITIN, IRONPCTSAT     ASSESSMENT/PLAN:     1. AKI, previously required dialysis, etiology contrast nephropathy, improving excellent UOP and downtrending creatinine consistent with recovery 2. Hypernatremia, likely solute diuresis, still with water deficit but improving.  Continue D5W at 159m/hr.  Check uosm for completeness 3. Encephalopathy, per neurology and CCM; likley has a chronic component 4. Acute hypoxic respiratory failure, self extubation 9/5, stable on nasal cannula 5. Hypertension, on amlodipine, stable 6. SNF resident 7. Schizophrenia 8. Chart history of dementia 9. History of left CVA    [place] DO,  FACP

## 2019-04-09 NOTE — Progress Notes (Signed)
Late entry  Speech-Language-cognitive evaluation    04/08/19 1340  SLP Visit Information  SLP Received On 04/08/19  SLP Time Calculation  SLP Start Time (ACUTE ONLY) 0909  SLP Stop Time (ACUTE ONLY) 7619  SLP Time Calculation (min) (ACUTE ONLY) 9 min  General Information  HPI Chad Mcclure is a 63 y.o. male who has a PMH including but not limited to HTN, HLD, HCV, Left CVA (multiple infarcts involving cerebral white matter, thalami, and pons) DM, schizophrenia, dementia and who resides at [facility] in [place]. Admitted to [facility] on 03/18/19 with fever due PNA, required intubated on 8/21 for hypoxic respiratory failure, extubated 8/29 then required re-intubation 8/31 for staus epilepticus then transferred to [facility] 9/2 and self extubated 9/5.   Prior Functional Status  Cognitive/Linguistic Baseline Information not available  Pain Assessment  Pain Assessment Faces  Faces Pain Scale 0  Pain Intervention(s) Monitored during session  Oral Motor/Sensory Function  Overall Oral Motor/Sensory Function Mild impairment  Cognition  Overall Cognitive Status Impaired/Different from baseline  Arousal/Alertness Lethargic  Orientation Level Oriented to person;Disoriented to place;Disoriented to time;Disoriented to situation  Attention Sustained  Sustained Attention Impaired  Memory  (to be assessed as alertness improves)  Awareness Impaired  Awareness Impairment Intellectual impairment  Problem Solving Impaired  Problem Solving Impairment Functional basic  Safety/Judgment Impaired  Auditory Comprehension  Overall Auditory Comprehension Impaired  Yes/No Questions X  Basic Immediate Environment Questions 75-100% accurate  Commands X  Two Step Basic Commands 0-24% accurate  Interfering Components Attention;Processing speed  EffectiveTechniques Extra processing time;Repetition  Visual Recognition/Discrimination  Discrimination Not tested  Reading Comprehension  Reading Status Not  tested  Expression  Primary Mode of Expression Verbal  Verbal Expression  Overall Verbal Expression Impaired  Initiation Impaired  Naming Not tested  Pragmatics Impairment  Impairments Abnormal affect;Eye contact  Written Expression  Written Expression Not tested  Motor Speech  Overall Motor Speech Impaired  Respiration Impaired  Phonation  (no phonation on attempts)  Intelligibility UTA  SLP - End of Session  Patient left in bed;with restraints reapplied  Assessment  Clinical Impression Statement (ACUTE ONLY) Pt demonstrates impairments with arousal, attention, respiratory support and articulatory precision for speech and communication due to weakness and deconditioned state. Efforts for phonation were unsuccessful as his ability to achieve adequate inhalation and exhalation is significantly diminished. He requires extended time of multimodal stimulation to achieve a functional wakeful state. He was able to follow simple commands with additional response time and repetition. ST will continue working with pt with focus on cognition, communication and swallow.   SLP Recommendation/Assessment Patient needs continued Speech Lanaguage Pathology Services  SLP Visit Diagnosis Dysarthria and anarthria (R47.1);Cognitive communication deficit (R41.841)  Problem List Problem Solving;Attention (dysarthria, awareness)  Plan  Speech Therapy Frequency (ACUTE ONLY) min 2x/week  Duration 2 weeks  Treatment/Interventions Cognitive reorganization;Functional tasks;SLP instruction and feedback;Compensatory strategies  Potential to Achieve Goals (ACUTE ONLY) Good  SLP Recommendations  Follow up Recommendations Skilled Nursing facility  SLP Equipment None recommended by SLP  Individuals Consulted  Consulted and Agree with Results and Recommendations Patient  SLP Goals  Progress/Goals/Alternative treatment plan discussed with pt/caregiver and they Agree  SLP Evaluations  $ SLP Speech Visit 1 Visit   SLP Evaluations  $ SLP EVAL LANGUAGE/SOUND PRODUCTION 1 Procedure   [name] M.Ed Risk analyst [phone] Office [phone]

## 2019-04-10 LAB — GLUCOSE, CAPILLARY
Glucose-Capillary: 124 mg/dL — ABNORMAL HIGH (ref 70–99)
Glucose-Capillary: 141 mg/dL — ABNORMAL HIGH (ref 70–99)
Glucose-Capillary: 147 mg/dL — ABNORMAL HIGH (ref 70–99)
Glucose-Capillary: 149 mg/dL — ABNORMAL HIGH (ref 70–99)
Glucose-Capillary: 151 mg/dL — ABNORMAL HIGH (ref 70–99)
Glucose-Capillary: 168 mg/dL — ABNORMAL HIGH (ref 70–99)

## 2019-04-10 LAB — BASIC METABOLIC PANEL
Anion gap: 10 (ref 5–15)
BUN: 41 mg/dL — ABNORMAL HIGH (ref 8–23)
CO2: 21 mmol/L — ABNORMAL LOW (ref 22–32)
Calcium: 8.7 mg/dL — ABNORMAL LOW (ref 8.9–10.3)
Chloride: 116 mmol/L — ABNORMAL HIGH (ref 98–111)
Creatinine, Ser: 2.68 mg/dL — ABNORMAL HIGH (ref 0.61–1.24)
GFR calc Af Amer: 28 mL/min — ABNORMAL LOW (ref >60)
GFR calc non Af Amer: 24 mL/min — ABNORMAL LOW (ref >60)
Glucose, Bld: 146 mg/dL — ABNORMAL HIGH (ref 70–99)
Potassium: 3.6 mmol/L (ref 3.5–5.1)
Sodium: 147 mmol/L — ABNORMAL HIGH (ref 135–145)

## 2019-04-10 MED ORDER — LACOSAMIDE 50 MG PO TABS
100.0000 mg | ORAL_TABLET | Freq: Two times a day (BID) | ORAL | Status: DC
  Administered 2019-04-10 – 2019-04-24 (×29): 100 mg
  Filled 2019-04-10 (×29): qty 2

## 2019-04-10 NOTE — Progress Notes (Signed)
[facility]    NEPHROLOGY PROGRESS NOTE  SUBJECTIVE:   Patient awake, confused, unable to provide review of systems.  Patient seen and examined.   OBJECTIVE:  Vitals:   04/10/19 0356 04/10/19 0810  BP: 140/88 (!) 156/92  Pulse: 76   Resp: (!) 21   Temp: 98.4 F (36.9 C) 98.5 F (36.9 C)  SpO2: 95%     Intake/Output Summary (Last 24 hours) at 04/10/2019 1132 Last data filed at 04/10/2019 8250 Gross per 24 hour  Intake -  Output 2800 ml  Net -2800 ml      General: Confused, NAD HEENT: MMM [place] AT anicteric sclera Neck:  No JVD, no adenopathy CV:  Heart RRR  Lungs: Coarse breath sounds bilaterally Abd:  abd SNT/ND with normal BS GU:  Bladder non-palpable Extremities: +1 bilateral lower extremity edema Skin:  No skin rash  MEDICATIONS:  . amLODipine  10 mg Per Tube Daily  . chlorhexidine  15 mL Mouth Rinse BID  . chlorhexidine gluconate (MEDLINE KIT)  15 mL Mouth Rinse BID  . Chlorhexidine Gluconate Cloth  6 each Topical Daily  . feeding supplement (PRO-STAT SUGAR FREE 64)  30 mL Per Tube TID  . free water  400 mL Per Tube Q3H  . heparin  5,000 Units Subcutaneous Q8H  . insulin aspart  0-9 Units Subcutaneous Q4H  . lacosamide  100 mg Per Tube BID  . levETIRAcetam  500 mg Oral BID  . mouth rinse  15 mL Mouth Rinse 10 times per day  . mouth rinse  15 mL Mouth Rinse q12n4p  . pantoprazole sodium  40 mg Per Tube Daily  . vancomycin variable dose per unstable renal function (pharmacist dosing)   Does not apply See admin instructions       LABS:   CBC Latest Ref Rng & Units 04/08/2019 04/06/2019 04/04/2019  WBC 4.0 - 10.5 K/uL 7.7 7.6 -  Hemoglobin 13.0 - 17.0 g/dL 11.6(L) 11.2(L) 9.2(L)  Hematocrit 39.0 - 52.0 % 35.9(L) 35.9(L) 27.0(L)  Platelets 150 - 400 K/uL 205 301 -    CMP Latest Ref Rng & Units 04/10/2019 04/09/2019 04/08/2019  Glucose 70 - 99 mg/dL 146(H) 142(H) 151(H)  BUN 8 - 23 mg/dL 41(H) 46(H) 52(H)  Creatinine 0.61 - 1.24 mg/dL 2.68(H)  2.98(H) 2.97(H)  Sodium 135 - 145 mmol/L 147(H) 148(H) 150(H)  Potassium 3.5 - 5.1 mmol/L 3.6 3.4(L) 3.8  Chloride 98 - 111 mmol/L 116(H) 117(H) 120(H)  CO2 22 - 32 mmol/L 21(L) 22 18(L)  Calcium 8.9 - 10.3 mg/dL 8.7(L) 8.3(L) 8.2(L)  Total Protein 6.5 - 8.1 g/dL - - -  Total Bilirubin 0.3 - 1.2 mg/dL - - -  Alkaline Phos 38 - 126 U/L - - -  AST 15 - 41 U/L - - -  ALT 0 - 44 U/L - - -    Lab Results  Component Value Date   CALCIUM 8.7 (L) 04/10/2019   CAION 1.21 04/04/2019   PHOS 3.4 04/06/2019       Component Value Date/Time   COLORURINE STRAW (A) 04/01/2019 1424   APPEARANCEUR CLEAR 04/01/2019 1424   LABSPEC 1.006 04/01/2019 1424   PHURINE 6.0 04/01/2019 1424   GLUCOSEU NEGATIVE 04/01/2019 1424   HGBUR MODERATE (A) 04/01/2019 1424   BILIRUBINUR NEGATIVE 04/01/2019 1424   KETONESUR NEGATIVE 04/01/2019 1424   PROTEINUR 100 (A) 04/01/2019 1424   UROBILINOGEN 1.0 12/20/2010 2007   NITRITE NEGATIVE 04/01/2019 1424   LEUKOCYTESUR NEGATIVE 04/01/2019 1424  Component Value Date/Time   PHART 7.424 04/04/2019 0435   PCO2ART 35.7 04/04/2019 0435   PO2ART 74.0 (L) 04/04/2019 0435   HCO3 23.3 04/04/2019 0435   TCO2 24 04/04/2019 0435   ACIDBASEDEF 1.0 04/04/2019 0435   O2SAT 95.0 04/04/2019 0435    No results found for: IRON, TIBC, FERRITIN, IRONPCTSAT     ASSESSMENT/PLAN:     1. AKI, previously required dialysis, etiology contrast nephropathy, improving excellent UOP and downtrending creatinine consistent with recovery 2. Hypernatremia, likely solute diuresis.  Will discontinue D5W.  Encephalopathy, per neurology and CCM; likley has a chronic component 3. Acute hypoxic respiratory failure, self extubation 9/5, stable on nasal cannula 4. Hypertension, on amlodipine, stable 5. SNF resident 6. Schizophrenia 7. Chart history of dementia 8. History of left CVA    [place], DO, FACP
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
## 2019-04-11 LAB — GLUCOSE, CAPILLARY
Glucose-Capillary: 133 mg/dL — ABNORMAL HIGH (ref 70–99)
Glucose-Capillary: 145 mg/dL — ABNORMAL HIGH (ref 70–99)
Glucose-Capillary: 157 mg/dL — ABNORMAL HIGH (ref 70–99)
Glucose-Capillary: 148 mg/dL — ABNORMAL HIGH (ref 70–99)
Glucose-Capillary: 132 mg/dL — ABNORMAL HIGH (ref 70–99)
Glucose-Capillary: 210 mg/dL — ABNORMAL HIGH (ref 70–99)

## 2019-04-11 LAB — BASIC METABOLIC PANEL
Anion gap: 7 (ref 5–15)
BUN: 38 mg/dL — ABNORMAL HIGH (ref 8–23)
CO2: 22 mmol/L (ref 22–32)
Calcium: 8.6 mg/dL — ABNORMAL LOW (ref 8.9–10.3)
Chloride: 117 mmol/L — ABNORMAL HIGH (ref 98–111)
Creatinine, Ser: 2.38 mg/dL — ABNORMAL HIGH (ref 0.61–1.24)
GFR calc Af Amer: 32 mL/min — ABNORMAL LOW (ref >60)
GFR calc non Af Amer: 28 mL/min — ABNORMAL LOW (ref >60)
Glucose, Bld: 167 mg/dL — ABNORMAL HIGH (ref 70–99)
Potassium: 3.8 mmol/L (ref 3.5–5.1)
Sodium: 146 mmol/L — ABNORMAL HIGH (ref 135–145)

## 2019-04-11 NOTE — Progress Notes (Signed)
[facility]    NEPHROLOGY PROGRESS NOTE  SUBJECTIVE:   Patient awake, confused, unable to provide review of systems.  Patient seen and examined.   OBJECTIVE:  Vitals:   04/11/19 0309 04/11/19 0758  BP: (!) 143/87 (!) 190/92  Pulse: 97 (!) 121  Resp: 20 (!) 22  Temp: 98.6 F (37 C) 98.3 F (36.8 C)  SpO2: 95% 92%    Intake/Output Summary (Last 24 hours) at 04/11/2019 1227 Last data filed at 04/11/2019 0600 Gross per 24 hour  Intake -  Output 4000 ml  Net -4000 ml      General: Confused, NAD HEENT: MMM [place] AT anicteric sclera Neck:  No JVD, no adenopathy CV:  Heart RRR  Lungs: Coarse breath sounds bilaterally Abd:  abd SNT/ND with normal BS GU:  Bladder non-palpable Extremities: +1 bilateral lower extremity edema Skin:  No skin rash  MEDICATIONS:  . amLODipine  10 mg Per Tube Daily  . chlorhexidine  15 mL Mouth Rinse BID  . chlorhexidine gluconate (MEDLINE KIT)  15 mL Mouth Rinse BID  . Chlorhexidine Gluconate Cloth  6 each Topical Daily  . feeding supplement (PRO-STAT SUGAR FREE 64)  30 mL Per Tube TID  . free water  400 mL Per Tube Q3H  . heparin  5,000 Units Subcutaneous Q8H  . insulin aspart  0-9 Units Subcutaneous Q4H  . lacosamide  100 mg Per Tube BID  . levETIRAcetam  500 mg Oral BID  . mouth rinse  15 mL Mouth Rinse 10 times per day  . mouth rinse  15 mL Mouth Rinse q12n4p  . pantoprazole sodium  40 mg Per Tube Daily  . vancomycin variable dose per unstable renal function (pharmacist dosing)   Does not apply See admin instructions       LABS:   CBC Latest Ref Rng & Units 04/08/2019 04/06/2019 04/04/2019  WBC 4.0 - 10.5 K/uL 7.7 7.6 -  Hemoglobin 13.0 - 17.0 g/dL 11.6(L) 11.2(L) 9.2(L)  Hematocrit 39.0 - 52.0 % 35.9(L) 35.9(L) 27.0(L)  Platelets 150 - 400 K/uL 205 301 -    CMP Latest Ref Rng & Units 04/11/2019 04/10/2019 04/09/2019  Glucose 70 - 99 mg/dL 167(H) 146(H) 142(H)  BUN 8 - 23 mg/dL 38(H) 41(H) 46(H)  Creatinine 0.61 - 1.24  mg/dL 2.38(H) 2.68(H) 2.98(H)  Sodium 135 - 145 mmol/L 146(H) 147(H) 148(H)  Potassium 3.5 - 5.1 mmol/L 3.8 3.6 3.4(L)  Chloride 98 - 111 mmol/L 117(H) 116(H) 117(H)  CO2 22 - 32 mmol/L 22 21(L) 22  Calcium 8.9 - 10.3 mg/dL 8.6(L) 8.7(L) 8.3(L)  Total Protein 6.5 - 8.1 g/dL - - -  Total Bilirubin 0.3 - 1.2 mg/dL - - -  Alkaline Phos 38 - 126 U/L - - -  AST 15 - 41 U/L - - -  ALT 0 - 44 U/L - - -    Lab Results  Component Value Date   CALCIUM 8.6 (L) 04/11/2019   CAION 1.21 04/04/2019   PHOS 3.4 04/06/2019       Component Value Date/Time   COLORURINE STRAW (A) 04/01/2019 1424   APPEARANCEUR CLEAR 04/01/2019 1424   LABSPEC 1.006 04/01/2019 1424   PHURINE 6.0 04/01/2019 1424   GLUCOSEU NEGATIVE 04/01/2019 1424   HGBUR MODERATE (A) 04/01/2019 1424   BILIRUBINUR NEGATIVE 04/01/2019 1424   KETONESUR NEGATIVE 04/01/2019 1424   PROTEINUR 100 (A) 04/01/2019 1424   UROBILINOGEN 1.0 12/20/2010 2007   NITRITE NEGATIVE 04/01/2019 1424   LEUKOCYTESUR NEGATIVE 04/01/2019 1424  Component Value Date/Time   PHART 7.424 04/04/2019 0435   PCO2ART 35.7 04/04/2019 0435   PO2ART 74.0 (L) 04/04/2019 0435   HCO3 23.3 04/04/2019 0435   TCO2 24 04/04/2019 0435   ACIDBASEDEF 1.0 04/04/2019 0435   O2SAT 95.0 04/04/2019 0435    No results found for: IRON, TIBC, FERRITIN, IRONPCTSAT     ASSESSMENT/PLAN:     1. AKI, previously required dialysis, etiology contrast nephropathy, improving excellent UOP and downtrending creatinine consistent with recovery.  Dialysis catheter removed. 2. Hypernatremia, likely solute diuresis.  Will discontinue D5W.  Encephalopathy, per neurology and CCM; likley has a chronic component 3. Acute hypoxic respiratory failure, self extubation 9/5, stable on nasal cannula 4. Hypertension, on amlodipine, stable 5. SNF resident 6. Schizophrenia 7. Chart history of dementia 8. History of left CVA    [place], DO, FACP
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
## 2019-04-11 NOTE — Progress Notes (Signed)
Assisted tele visit to patient with family member.  Chad Duckett McEachran, RN  

## 2019-04-12 LAB — GLUCOSE, CAPILLARY
Glucose-Capillary: 175 mg/dL — ABNORMAL HIGH (ref 70–99)
Glucose-Capillary: 174 mg/dL — ABNORMAL HIGH (ref 70–99)
Glucose-Capillary: 177 mg/dL — ABNORMAL HIGH (ref 70–99)
Glucose-Capillary: 266 mg/dL — ABNORMAL HIGH (ref 70–99)
Glucose-Capillary: 228 mg/dL — ABNORMAL HIGH (ref 70–99)

## 2019-04-12 LAB — BASIC METABOLIC PANEL
Anion gap: 10 (ref 5–15)
BUN: 42 mg/dL — ABNORMAL HIGH (ref 8–23)
CO2: 21 mmol/L — ABNORMAL LOW (ref 22–32)
Calcium: 8.9 mg/dL (ref 8.9–10.3)
Chloride: 121 mmol/L — ABNORMAL HIGH (ref 98–111)
Creatinine, Ser: 2.49 mg/dL — ABNORMAL HIGH (ref 0.61–1.24)
GFR calc Af Amer: 31 mL/min — ABNORMAL LOW (ref >60)
GFR calc non Af Amer: 26 mL/min — ABNORMAL LOW (ref >60)
Glucose, Bld: 184 mg/dL — ABNORMAL HIGH (ref 70–99)
Potassium: 4.4 mmol/L (ref 3.5–5.1)
Sodium: 152 mmol/L — ABNORMAL HIGH (ref 135–145)

## 2019-04-12 MED ORDER — SODIUM CHLORIDE 0.9 % IV SOLN: INTRAVENOUS | Status: DC

## 2019-04-12 MED ORDER — MORPHINE SULFATE (PF) 2 MG/ML IV SOLN
2.0000 mg | INTRAVENOUS | Status: DC | PRN
  Administered 2019-04-12 – 2019-04-21 (×4): 2 mg via INTRAVENOUS
  Filled 2019-04-12 (×4): qty 1

## 2019-04-12 MED ORDER — DEXTROSE 5 % IV SOLN
INTRAVENOUS | Status: DC
  Administered 2019-04-12: 16:00:00 via INTRAVENOUS

## 2019-04-12 NOTE — Progress Notes (Signed)
[facility]    NEPHROLOGY PROGRESS NOTE  SUBJECTIVE:   Patient awake, confused, unable to provide review of systems.  Patient seen and examined.   OBJECTIVE:  Vitals:   04/12/19 0344 04/12/19 0829  BP: 122/84 (!) 145/92  Pulse:  (!) 108  Resp:  (!) 22  Temp: (!) 97.5 F (36.4 C) (!) 97.5 F (36.4 C)  SpO2:  92%    Intake/Output Summary (Last 24 hours) at 04/12/2019 1515 Last data filed at 04/12/2019 0344 Gross per 24 hour  Intake -  Output 2150 ml  Net -2150 ml      General: Confused, NAD HEENT: MMM [place] AT anicteric sclera Neck:  No JVD, no adenopathy CV:  Heart RRR  Lungs: Coarse breath sounds bilaterally Abd:  abd SNT/ND with normal BS GU:  Bladder non-palpable Extremities: +1 bilateral lower extremity edema Skin:  No skin rash  MEDICATIONS:  . amLODipine  10 mg Per Tube Daily  . chlorhexidine  15 mL Mouth Rinse BID  . chlorhexidine gluconate (MEDLINE KIT)  15 mL Mouth Rinse BID  . Chlorhexidine Gluconate Cloth  6 each Topical Daily  . feeding supplement (PRO-STAT SUGAR FREE 64)  30 mL Per Tube TID  . free water  400 mL Per Tube Q3H  . heparin  5,000 Units Subcutaneous Q8H  . insulin aspart  0-9 Units Subcutaneous Q4H  . lacosamide  100 mg Per Tube BID  . levETIRAcetam  500 mg Oral BID  . mouth rinse  15 mL Mouth Rinse 10 times per day  . mouth rinse  15 mL Mouth Rinse q12n4p  . pantoprazole sodium  40 mg Per Tube Daily  . vancomycin variable dose per unstable renal function (pharmacist dosing)   Does not apply See admin instructions       LABS:   CBC Latest Ref Rng & Units 04/08/2019 04/06/2019 04/04/2019  WBC 4.0 - 10.5 K/uL 7.7 7.6 -  Hemoglobin 13.0 - 17.0 g/dL 11.6(L) 11.2(L) 9.2(L)  Hematocrit 39.0 - 52.0 % 35.9(L) 35.9(L) 27.0(L)  Platelets 150 - 400 K/uL 205 301 -    CMP Latest Ref Rng & Units 04/12/2019 04/11/2019 04/10/2019  Glucose 70 - 99 mg/dL 184(H) 167(H) 146(H)  BUN 8 - 23 mg/dL 42(H) 38(H) 41(H)  Creatinine 0.61 - 1.24  mg/dL 2.49(H) 2.38(H) 2.68(H)  Sodium 135 - 145 mmol/L 152(H) 146(H) 147(H)  Potassium 3.5 - 5.1 mmol/L 4.4 3.8 3.6  Chloride 98 - 111 mmol/L 121(H) 117(H) 116(H)  CO2 22 - 32 mmol/L 21(L) 22 21(L)  Calcium 8.9 - 10.3 mg/dL 8.9 8.6(L) 8.7(L)  Total Protein 6.5 - 8.1 g/dL - - -  Total Bilirubin 0.3 - 1.2 mg/dL - - -  Alkaline Phos 38 - 126 U/L - - -  AST 15 - 41 U/L - - -  ALT 0 - 44 U/L - - -    Lab Results  Component Value Date   CALCIUM 8.9 04/12/2019   CAION 1.21 04/04/2019   PHOS 3.4 04/06/2019       Component Value Date/Time   COLORURINE STRAW (A) 04/01/2019 1424   APPEARANCEUR CLEAR 04/01/2019 1424   LABSPEC 1.006 04/01/2019 1424   PHURINE 6.0 04/01/2019 1424   GLUCOSEU NEGATIVE 04/01/2019 1424   HGBUR MODERATE (A) 04/01/2019 1424   BILIRUBINUR NEGATIVE 04/01/2019 1424   KETONESUR NEGATIVE 04/01/2019 1424   PROTEINUR 100 (A) 04/01/2019 1424   UROBILINOGEN 1.0 12/20/2010 2007   NITRITE NEGATIVE 04/01/2019 1424   LEUKOCYTESUR NEGATIVE 04/01/2019 1424  Component Value Date/Time   PHART 7.424 04/04/2019 0435   PCO2ART 35.7 04/04/2019 0435   PO2ART 74.0 (L) 04/04/2019 0435   HCO3 23.3 04/04/2019 0435   TCO2 24 04/04/2019 0435   ACIDBASEDEF 1.0 04/04/2019 0435   O2SAT 95.0 04/04/2019 0435    No results found for: IRON, TIBC, FERRITIN, IRONPCTSAT     ASSESSMENT/PLAN:     1. AKI, previously required dialysis, etiology contrast nephropathy, improving excellent UOP and downtrending creatinine consistent with recovery.  Dialysis catheter removed. 2. Hypernatremia, likely solute diuresis. Uosm 313.  DC'ed D5W, then serum Na worsened.  On FWF 447m q3 hours.  If Na increases again, may need to consider DI.  Will repeat Uosm. 3. Encephalopathy.  likley has a chronic component 4. Acute hypoxic respiratory failure, self extubation 9/5, stable on nasal cannula 5. Hypertension, on amlodipine, stable 6. SNF resident 7. Schizophrenia 8. Chart history of  dementia 9. History of left CVA    [place] DO, FACP

## 2019-04-12 NOTE — Progress Notes (Signed)
Consult received.  Chart reviewed.  Spoke with Dtr [name] on the phone.  PMT [place] meeting planned with his sons and dtr at bedside tomorrow at 1:00 pm.  Florentina Jenny, PA-C Palliative Medicine Pager: [phone]  No charge note.

## 2019-04-12 NOTE — Progress Notes (Signed)
  Speech Language Pathology Treatment: Dysphagia  Patient Details Name: Chad Mcclure MRN: [id number] DOB: 1955/09/25 Today's Date: 04/12/2019 Time: 4356-8616 SLP Time Calculation (min) (ACUTE ONLY): 34 min  Assessment / Plan / Recommendation Clinical Impression  Pt presented with a strong congested cough and severe secretion residue in the oral cavity. Pt is able to mobilize secretions but not eliminate them from the oral cavity, where they remain unless external oral care is provided. Upon oral motor examination, the dorsal surface of his tongue was coated with candidia throughout. Labial peeling, buccal and dental residue, and coagulated dried secretions layered his hard and soft palate and were removed with suction and tweezers after moistening with ice chips and toothettes. Pt is at increased risk for aspiration of secretions. Recommend aggressive oral care QID with suction, ideally whenever there is an opportunity in order to reduce continued residue build up. Continue current plan of care regarding PO readiness and cog/comm tx.    HPI HPI: Chad Mcclure is a 63 y.o. male who has a PMH including but not limited to HTN, HLD, HCV, Left CVA (multiple infarcts involving cerebral white matter, thalami, and pons) DM, schizophrenia, dementia and who resides at [facility] in [place]. Admitted to [facility] on 03/18/19 with fever due PNA, required intubated on 8/21 for hypoxic respiratory failure, extubated 8/29 then required re-intubation 8/31 for staus epilepticus then transferred to [facility] 9/2 and self extubated 9/5.       SLP Plan  Continue with current plan of care       Recommendations  Diet recommendations: NPO Medication Administration: Via alternative means                Oral Care Recommendations: Oral care QID;Staff/trained caregiver to provide oral care(Aggressive oral care regularly ) Follow up Recommendations: Skilled Nursing facility SLP Visit Diagnosis: Dysphagia,  unspecified (R13.10);Cognitive communication deficit (R41.841) Plan: Continue with current plan of care       GO                [place] 04/12/2019, 3:05 PM

## 2019-04-12 NOTE — Progress Notes (Addendum)
PROGRESS NOTE    Chad Mcclure  XYV:[id number] DOB: 06-12-1956 DOA: 03/31/2019 PCP: Patient, No Pcp Per   Brief Narrative:  Patient is a 63 year old male with H/O HTN, HLD, HCV, Left CVA, DM, schizophrenia, dementia and who resides at SNF in [place] , who initially was admitted to [facility] on 8/20 with fevers.  He was suspected to have pneumonia and cystitis.  He required intubation on 8/21 for acute hypoxic respiratory failure.  Extubated on 8/29 did not require intubation on 8/31 for status epilepticus. He required initiation of hemodialysis after failing a lasix challenge, TTS schedule planned (R subclavian perm cath placed). He was transferred to [facility] on 9/2 and was under PCCM service.  He has been extubated now and transferred to hospitalist team on 04/06/2019.  He was managed for MRSA associated pneumonia, seizures.  Hospital course remarkable for persistent encephalopathy .  Nephrology was following for dialysis. Now off dialysis.we are waiting further improvement in the mental status so that he can be fed by mouth.  Prolonged hospitalization due to persistent encephalopathy, dysphagia.  Palliative care consulted today for goals of care discussion.  Attempted to call family for discussion on possible PEG tube placement versus hospice care.  Assessment & Plan:   Principal Problem:   Acute respiratory failure with hypoxia (HCC) Active Problems:   Status epilepticus (HCC)   AKI (acute kidney injury) ([place])   Encephalopathy acute   Aspiration pneumonia (HCC)   Encephalopathy: Secondary to seizures, ICU delirium, sedating medicines.  Neurology was following.  EEG on 04/01/2019 showed severe encephalopathy but no seizures.  Prolonged encephalopathy most likely secondary to prolonged seizure episodes/status epilepticus. Mental status has just slightly  improved.  He follows very simple commands and opens eyes when calling name . Looks lethargic, deconditioned/debilitated and  weak.  Acute hypoxic respiratory failure:Treated for MRSA pneumonia.  Had to be intubated twice.  Self extubated on 9/5.  Currently respiratory status stable on nasal cannula.  On 5 L of oxygen per minute. Continue pulmonary toileting.  MRSA pneumonia: Completed  the course of vancomycin.  Currently respiratory status is stable.  Seizure disorder: On Vimpat, Keppra.  Neurology was following.  AKI suspected secondary to contrast nephropathy: Nephrology following.  Started on dialysis on 8/24 at [facility] He is having good urine output.  Expecting full renal recovery.  Nephrology following.  He had permanent cath on the right chest which has been removed.  Hypernatremia:NA 152 today. Nephrology following.D5 has been discontinued.On free water.  Hypertension: Continue blood pressure medicines.  Continue to monitor blood pressure  Dysphagia/protein calorie malnutrition: Dietitian following.  Currently on tube feeding. Speech therapy continues to recommend NPO.Anticipated improvement so that he can be fed by mouth.If not we have to put a PEG tube.We will discuss with family about this.  Diabetes mellitus: Continue sliding scale insulin.  History of schizophrenia/dementia: Continue supportive care.  History of left CVA: Has residual weakness on the left side.  Skilled nursing facility resident.He had slurred speech on baseline.  Debility/deconditioning: PT/OT consulted and recommended skilled nursing facility.  Social worker made aware.  When he is medically ready for discharge, he will be discharged back to skilled nursing facility at [facility]. I have consulted palliative care today for direction of goals of care.  Patient will have a very poor quality of life given his multiple comorbidities, history of previous stroke.  He is a hospice candidate  Nutrition Problem: Inadequate oral intake Etiology: inability to eat  DVT prophylaxis: Heparin [place] Code Status: Full Family Communication:  Called and talked to daughter on 03/08/19.Called daughter again today but call not received. Disposition Plan: Back to skilled nursing facility at [facility] when medically stable.Still on tube feeding.Might need PEG tube for feeding if family agress    Consultants: PCCM, neurology, nephrology  Procedures: Intubation, extubation, dialysis catheter placement  Antimicrobials:  Anti-infectives (From admission, onward)   Start     Dose/Rate Route Frequency Ordered Stop   04/07/19 1000  vancomycin (VANCOCIN) 500 mg in sodium chloride 0.9 % 100 mL IVPB     500 mg 100 mL/hr over 60 Minutes Intravenous  Once 04/07/19 0835 04/07/19 1026   04/04/19 0830  vancomycin (VANCOCIN) 1,500 mg in sodium chloride 0.9 % 500 mL IVPB     1,500 mg 250 mL/hr over 120 Minutes Intravenous  Once 04/04/19 0810 04/04/19 1127   04/03/19 1200  vancomycin (VANCOCIN) IVPB 1000 mg/200 mL premix  Status:  Discontinued     1,000 mg 200 mL/hr over 60 Minutes Intravenous Every T-Th-Sa (Hemodialysis) 04/01/19 1253 04/03/19 0744   04/03/19 0746  vancomycin variable dose per unstable renal function (pharmacist dosing)      Does not apply See admin instructions 04/03/19 0746     04/01/19 1400  vancomycin (VANCOCIN) 2,000 mg in sodium chloride 0.9 % 500 mL IVPB     2,000 mg 250 mL/hr over 120 Minutes Intravenous  Once 04/01/19 1253 04/01/19 1900   04/01/19 0800  meropenem (MERREM) 500 mg in sodium chloride 0.9 % 100 mL IVPB  Status:  Discontinued     500 mg 200 mL/hr over 30 Minutes Intravenous Every 24 hours 04/01/19 0059 04/03/19 0757   03/31/19 2315  piperacillin-tazobactam (ZOSYN) IVPB 3.375 g     3.375 g 100 mL/hr over 30 Minutes Intravenous  Once 03/31/19 2300 04/01/19 0031      Subjective:   Patient seen and examined the bedside this morning.  Hemodynamically stable.  No new changes since yesterday.  Continues to remain encephalopathic, weak.  Opens eyes when calling his name.  Objective: Vitals:   04/11/19 2300  04/12/19 0344 04/12/19 0500 04/12/19 0829  BP: (!) 146/102 122/84  (!) 145/92  Pulse:    (!) 108  Resp:    (!) 22  Temp: (!) 97.5 F (36.4 C) (!) 97.5 F (36.4 C)  (!) 97.5 F (36.4 C)  TempSrc: Oral Oral    SpO2:    92%  Weight:   85.5 kg   Height:        Intake/Output Summary (Last 24 hours) at 04/12/2019 1336 Last data filed at 04/12/2019 0344 Gross per 24 hour  Intake -  Output 2150 ml  Net -2150 ml   Filed Weights   04/08/19 0500 04/11/19 0500 04/12/19 0500  Weight: 85.2 kg 85.4 kg 85.5 kg    Examination:  General exam: Not in distress, extremely deconditioned/debilitated HEENT:Oral mucosa moist, Ear/Nose normal on gross exam,feeding tube Respiratory system: Bilateral decreased air entry in the bases.  No wheezes or crackles auscultated Cardiovascular system: S1 & S2 heard, RRR. No JVD, murmurs, rubs, gallops or clicks. No pedal edema. Gastrointestinal system: Abdomen is nondistended, soft and nontender. No organomegaly or masses felt. Normal bowel sounds heard. Central nervous system:  Left residual hemiparesis with contractures .  Awake but not alert.Attempts to speak with one word and knows he is in hospital extremities: No edema, no clubbing ,no cyanosis, distal peripheral pulses palpable. Skin: No rashes, lesions,no icterus ,no pallor  Data Reviewed: I have personally reviewed following labs and imaging studies  CBC: Recent Labs  Lab 04/06/19 0536 04/08/19 1202  WBC 7.6 7.7  NEUTROABS  --  5.2  HGB 11.2* 11.6*  HCT 35.9* 35.9*  MCV 84.7 84.7  PLT 301 443   Basic Metabolic Panel: Recent Labs  Lab 04/06/19 2140  04/08/19 1929 04/09/19 0833 04/10/19 0618 04/11/19 0838 04/12/19 0823  NA 156*   < > 150* 148* 147* 146* 152*  K 3.5   < > 3.8 3.4* 3.6 3.8 4.4  CL 124*   < > 120* 117* 116* 117* 121*  CO2 21*   < > 18* 22 21* 22 21*  GLUCOSE 149*   < > 151* 142* 146* 167* 184*  BUN 69*   < > 52* 46* 41* 38* 42*  CREATININE 4.05*   < > 2.97* 2.98*  2.68* 2.38* 2.49*  CALCIUM 8.5*   < > 8.2* 8.3* 8.7* 8.6* 8.9  PHOS 3.4  --   --   --   --   --   --    < > = values in this interval not displayed.   GFR: Estimated Creatinine Clearance: 33.3 mL/min (A) (by C-G formula based on SCr of 2.49 mg/dL (H)). Liver Function Tests: Recent Labs  Lab 04/06/19 2140  ALBUMIN 2.0*   No results for input(s): LIPASE, AMYLASE in the last 168 hours. No results for input(s): AMMONIA in the last 168 hours. Coagulation Profile: No results for input(s): INR, PROTIME in the last 168 hours. Cardiac Enzymes: No results for input(s): CKTOTAL, CKMB, CKMBINDEX, TROPONINI in the last 168 hours. BNP (last 3 results) No results for input(s): PROBNP in the last 8760 hours. HbA1C: No results for input(s): HGBA1C in the last 72 hours. CBG: Recent Labs  Lab 04/11/19 1938 04/11/19 2316 04/12/19 0342 04/12/19 0828 04/12/19 1135  GLUCAP 132* 210* 175* 174* 177*   Lipid Profile: No results for input(s): CHOL, HDL, LDLCALC, TRIG, CHOLHDL, LDLDIRECT in the last 72 hours. Thyroid Function Tests: No results for input(s): TSH, T4TOTAL, FREET4, T3FREE, THYROIDAB in the last 72 hours. Anemia Panel: No results for input(s): VITAMINB12, FOLATE, FERRITIN, TIBC, IRON, RETICCTPCT in the last 72 hours. Sepsis Labs: No results for input(s): PROCALCITON, LATICACIDVEN in the last 168 hours.  No results found for this or any previous visit (from the past 240 hour(s)).       Radiology Studies: No results found.      Scheduled Meds: . amLODipine  10 mg Per Tube Daily  . chlorhexidine  15 mL Mouth Rinse BID  . chlorhexidine gluconate (MEDLINE KIT)  15 mL Mouth Rinse BID  . Chlorhexidine Gluconate Cloth  6 each Topical Daily  . feeding supplement (PRO-STAT SUGAR FREE 64)  30 mL Per Tube TID  . free water  400 mL Per Tube Q3H  . heparin  5,000 Units Subcutaneous Q8H  . insulin aspart  0-9 Units Subcutaneous Q4H  . lacosamide  100 mg Per Tube BID  .  levETIRAcetam  500 mg Oral BID  . mouth rinse  15 mL Mouth Rinse 10 times per day  . mouth rinse  15 mL Mouth Rinse q12n4p  . pantoprazole sodium  40 mg Per Tube Daily  . vancomycin variable dose per unstable renal function (pharmacist dosing)   Does not apply See admin instructions   Continuous Infusions: . sodium chloride Stopped (04/02/19 0029)  . sodium chloride 10 mL/hr at 04/04/19 2000  . feeding supplement (VITAL  1.5 CAL) 40 mL/hr at 04/10/19 0829     LOS: 12 days    Time spent: 35 mins      Shelly Coss, MD [facility] Pager [phone]  If 7PM-7AM, please contact night-coverage [email or web address] Password [facility] 04/12/2019, 1:36 PM

## 2019-04-13 DIAGNOSIS — T17908A Unspecified foreign body in respiratory tract, part unspecified causing other injury, initial encounter: Secondary | ICD-10-CM

## 2019-04-13 DIAGNOSIS — Z515 Encounter for palliative care: Secondary | ICD-10-CM

## 2019-04-13 DIAGNOSIS — Z7189 Other specified counseling: Secondary | ICD-10-CM

## 2019-04-13 LAB — CBC WITH DIFFERENTIAL/PLATELET
Abs Immature Granulocytes: 0.03 10*3/uL (ref 0.00–0.07)
Basophils Absolute: 0 10*3/uL (ref 0.0–0.1)
Basophils Relative: 0 %
Eosinophils Absolute: 0.2 10*3/uL (ref 0.0–0.5)
Eosinophils Relative: 2 %
HCT: 36.4 % — ABNORMAL LOW (ref 39.0–52.0)
Hemoglobin: 12.1 g/dL — ABNORMAL LOW (ref 13.0–17.0)
Immature Granulocytes: 0 %
Lymphocytes Relative: 26 %
Lymphs Abs: 2.5 10*3/uL (ref 0.7–4.0)
MCH: 28.3 pg (ref 26.0–34.0)
MCHC: 33.2 g/dL (ref 30.0–36.0)
MCV: 85 fL (ref 80.0–100.0)
Monocytes Absolute: 1.3 10*3/uL — ABNORMAL HIGH (ref 0.1–1.0)
Monocytes Relative: 14 %
Neutro Abs: 5.4 10*3/uL (ref 1.7–7.7)
Neutrophils Relative %: 58 %
Platelets: 249 10*3/uL (ref 150–400)
RBC: 4.28 MIL/uL (ref 4.22–5.81)
RDW: 17.2 % — ABNORMAL HIGH (ref 11.5–15.5)
WBC: 9.4 10*3/uL (ref 4.0–10.5)
nRBC: 0 % (ref 0.0–0.2)

## 2019-04-13 LAB — BASIC METABOLIC PANEL
Anion gap: 12 (ref 5–15)
BUN: 43 mg/dL — ABNORMAL HIGH (ref 8–23)
CO2: 21 mmol/L — ABNORMAL LOW (ref 22–32)
Calcium: 8.7 mg/dL — ABNORMAL LOW (ref 8.9–10.3)
Chloride: 120 mmol/L — ABNORMAL HIGH (ref 98–111)
Creatinine, Ser: 2.44 mg/dL — ABNORMAL HIGH (ref 0.61–1.24)
GFR calc Af Amer: 31 mL/min — ABNORMAL LOW (ref >60)
GFR calc non Af Amer: 27 mL/min — ABNORMAL LOW (ref >60)
Glucose, Bld: 162 mg/dL — ABNORMAL HIGH (ref 70–99)
Potassium: 4.1 mmol/L (ref 3.5–5.1)
Sodium: 153 mmol/L — ABNORMAL HIGH (ref 135–145)

## 2019-04-13 LAB — GLUCOSE, CAPILLARY
Glucose-Capillary: 122 mg/dL — ABNORMAL HIGH (ref 70–99)
Glucose-Capillary: 138 mg/dL — ABNORMAL HIGH (ref 70–99)
Glucose-Capillary: 139 mg/dL — ABNORMAL HIGH (ref 70–99)
Glucose-Capillary: 160 mg/dL — ABNORMAL HIGH (ref 70–99)
Glucose-Capillary: 167 mg/dL — ABNORMAL HIGH (ref 70–99)
Glucose-Capillary: 168 mg/dL — ABNORMAL HIGH (ref 70–99)
Glucose-Capillary: 172 mg/dL — ABNORMAL HIGH (ref 70–99)

## 2019-04-13 LAB — OSMOLALITY, URINE: Osmolality, Ur: 331 mOsm/kg (ref 300–900)

## 2019-04-13 MED ORDER — METOPROLOL TARTRATE 12.5 MG HALF TABLET
12.5000 mg | ORAL_TABLET | Freq: Two times a day (BID) | ORAL | Status: DC
  Administered 2019-04-13 – 2019-04-16 (×7): 12.5 mg via ORAL
  Filled 2019-04-13 (×7): qty 1

## 2019-04-13 MED ORDER — PRO-STAT SUGAR FREE PO LIQD
30.0000 mL | Freq: Two times a day (BID) | ORAL | Status: DC
  Administered 2019-04-14 – 2019-04-21 (×14): 30 mL
  Filled 2019-04-13 (×14): qty 30

## 2019-04-13 MED ORDER — OSMOLITE 1.5 CAL PO LIQD
1000.0000 mL | ORAL | Status: DC
  Administered 2019-04-13: 1000 mL
  Filled 2019-04-13 (×2): qty 1000

## 2019-04-13 MED ORDER — CHLORHEXIDINE GLUCONATE CLOTH 2 % EX PADS
6.0000 | MEDICATED_PAD | Freq: Every day | CUTANEOUS | Status: DC
  Administered 2019-04-14 – 2019-04-24 (×9): 6 via TOPICAL

## 2019-04-13 MED ORDER — DEXTROSE 5 % IV SOLN
INTRAVENOUS | Status: DC
  Administered 2019-04-13 – 2019-04-14 (×2): via INTRAVENOUS

## 2019-04-13 NOTE — Progress Notes (Signed)
PROGRESS NOTE    Chad Mcclure  TMA:[id number] DOB: Feb 11, 1956 DOA: 03/31/2019 PCP: Patient, No Pcp Per   Brief Narrative:  Patient is a 63 year old male with H/O HTN, HLD, HCV, Left CVA, DM, schizophrenia, dementia and who resides at SNF in [place] , who initially was admitted to [facility] on 8/20 with fevers.  He was suspected to have pneumonia and cystitis.  He required intubation on 8/21 for acute hypoxic respiratory failure.  Extubated on 8/29 did not require intubation on 8/31 for status epilepticus. He required initiation of hemodialysis after failing a lasix challenge, TTS schedule planned (R subclavian perm cath placed). He was transferred to [facility] on 9/2 and was under PCCM service.  He has been extubated now and transferred to hospitalist team on 04/06/2019.  He was managed for MRSA associated pneumonia, seizures.  Hospital course remarkable for persistent encephalopathy .  Nephrology was following for dialysis. Now off dialysis.we are waiting further improvement in the mental status so that he can be fed by mouth.  Prolonged hospitalization due to persistent encephalopathy, dysphagia.  Palliative care consulted  for goals of care discussion.  Plan for family meeting   for discussion on possible PEG tube placement versus hospice care.  Assessment & Plan:   Principal Problem:   Acute respiratory failure with hypoxia (HCC) Active Problems:   Status epilepticus (HCC)   AKI (acute kidney injury) ([place])   Encephalopathy acute   Aspiration pneumonia (HCC)   Encephalopathy: Secondary to seizures, ICU delirium, sedating medicines.  Neurology was following.  EEG on 04/01/2019 showed severe encephalopathy but no seizures.  Prolonged encephalopathy most likely secondary to prolonged seizure episodes/status epilepticus. Mental status has just slightly  improved.  He follows very simple commands and opens eyes when calling name . Looks lethargic, deconditioned/debilitated and weak.   Acute hypoxic respiratory failure:Treated for MRSA pneumonia.  Had to be intubated twice.  Self extubated on 9/5.  Currently respiratory status stable on nasal cannula.  On 5 L of oxygen per minute. Continue pulmonary toileting.  MRSA pneumonia: Completed  the course of vancomycin.  Currently respiratory status is stable.  Seizure disorder: On Vimpat, Keppra.  Neurology was following.  AKI suspected secondary to contrast nephropathy: Nephrology following.  Started on dialysis on 8/24 at [facility]. He is having good urine output.  Expecting full renal recovery.  Nephrology following.  He had permanent cath on the right chest which has been removed.  Hypernatremia:NA 153 today. Nephrology following.D5 has been discontinued.On free water.Suspecting DI.  Persistent Sinus tachycardia: Started on lowe dose metoprolol.    Hypertension: Continue blood pressure medicines.  Continue to monitor blood pressure  Dysphagia/protein calorie malnutrition: Dietitian following.  Currently on tube feeding. Speech therapy continues to recommend NPO.Anticipated improvement so that he can be fed by mouth.If not we have to put a PEG tube.Palliative care  will discuss with family about this.  Diabetes mellitus: Continue sliding scale insulin.  History of schizophrenia/dementia: Continue supportive care.  History of left CVA: Has residual weakness on the left side.  Skilled nursing facility resident.He had slurred speech on baseline.  Debility/deconditioning: PT/OT consulted and recommended skilled nursing facility.  Social worker made aware.  When he is medically ready for discharge, he will be discharged back to skilled nursing facility at [facility]. I have consulted palliative care today for direction of goals of care.  Patient will have a very poor quality of life given his multiple comorbidities, history of previous stroke.  He is a hospice  candidate  Nutrition Problem: Inadequate oral intake Etiology: inability  to eat      DVT prophylaxis: Heparin [place] Code Status: Full Family Communication: Called and talked to daughter on 03/08/19.Called daughter again on 04/12/19 but call not received. Disposition Plan: Back to skilled nursing facility at [facility] when medically stable.Still on tube feeding.Might need PEG tube for feeding if family agrees.Difficult placement    Consultants: PCCM, neurology, nephrology  Procedures: Intubation, extubation, dialysis catheter placement  Antimicrobials:  Anti-infectives (From admission, onward)   Start     Dose/Rate Route Frequency Ordered Stop   04/07/19 1000  vancomycin (VANCOCIN) 500 mg in sodium chloride 0.9 % 100 mL IVPB     500 mg 100 mL/hr over 60 Minutes Intravenous  Once 04/07/19 0835 04/07/19 1026   04/04/19 0830  vancomycin (VANCOCIN) 1,500 mg in sodium chloride 0.9 % 500 mL IVPB     1,500 mg 250 mL/hr over 120 Minutes Intravenous  Once 04/04/19 0810 04/04/19 1127   04/03/19 1200  vancomycin (VANCOCIN) IVPB 1000 mg/200 mL premix  Status:  Discontinued     1,000 mg 200 mL/hr over 60 Minutes Intravenous Every T-Th-Sa (Hemodialysis) 04/01/19 1253 04/03/19 0744   04/03/19 0746  vancomycin variable dose per unstable renal function (pharmacist dosing)      Does not apply See admin instructions 04/03/19 0746     04/01/19 1400  vancomycin (VANCOCIN) 2,000 mg in sodium chloride 0.9 % 500 mL IVPB     2,000 mg 250 mL/hr over 120 Minutes Intravenous  Once 04/01/19 1253 04/01/19 1900   04/01/19 0800  meropenem (MERREM) 500 mg in sodium chloride 0.9 % 100 mL IVPB  Status:  Discontinued     500 mg 200 mL/hr over 30 Minutes Intravenous Every 24 hours 04/01/19 0059 04/03/19 0757   03/31/19 2315  piperacillin-tazobactam (ZOSYN) IVPB 3.375 g     3.375 g 100 mL/hr over 30 Minutes Intravenous  Once 03/31/19 2300 04/01/19 0031      Subjective:   Patient seen and examined at the bedside this morning.  Hemodynamically stable.  Mildly  Tachycardic.He  follows very  simple commands, eyes open.  Remains encephalopathic, extremely deconditioned  Objective: Vitals:   04/13/19 0600 04/13/19 0744 04/13/19 0800 04/13/19 1200  BP:  (!) 155/98 (!) 137/92 (!) 144/96  Pulse:  98 (!) 115 (!) 111  Resp:  20 (!) 23 19  Temp:  98 F (36.7 C) 98.1 F (36.7 C) 98.3 F (36.8 C)  TempSrc:   Oral Oral  SpO2:  100% 91% 94%  Weight: 85.1 kg     Height:        Intake/Output Summary (Last 24 hours) at 04/13/2019 1330 Last data filed at 04/13/2019 0000 Gross per 24 hour  Intake -  Output 1750 ml  Net -1750 ml   Filed Weights   04/11/19 0500 04/12/19 0500 04/13/19 0600  Weight: 85.4 kg 85.5 kg 85.1 kg    Examination:  General exam: Not in distress, extremely deconditioned/debilitated HEENT:Oral mucosa moist, Ear/Nose normal on gross exam,feeding tube Respiratory system: Bilateral decreased air entry in the bases.  No wheezes or crackles auscultated Cardiovascular system: S1 & S2 heard, RRR. No JVD, murmurs, rubs, gallops or clicks. No pedal edema. Gastrointestinal system: Abdomen is nondistended, soft and nontender. No organomegaly or masses felt. Normal bowel sounds heard. Central nervous system:  Left residual hemiparesis with contractures .  Awake but not alert.Attempts to speak with one word and knows he is in hospital extremities: No edema,  no clubbing ,no cyanosis, distal peripheral pulses palpable. Skin: No rashes, lesions,no icterus ,no pallor     Data Reviewed: I have personally reviewed following labs and imaging studies  CBC: Recent Labs  Lab 04/08/19 1202 04/13/19 0325  WBC 7.7 9.4  NEUTROABS 5.2 5.4  HGB 11.6* 12.1*  HCT 35.9* 36.4*  MCV 84.7 85.0  PLT 205 976   Basic Metabolic Panel: Recent Labs  Lab 04/06/19 2140  04/09/19 0833 04/10/19 0618 04/11/19 0838 04/12/19 0823 04/13/19 0325  NA 156*   < > 148* 147* 146* 152* 153*  K 3.5   < > 3.4* 3.6 3.8 4.4 4.1  CL 124*   < > 117* 116* 117* 121* 120*  CO2 21*   < > 22 21* 22  21* 21*  GLUCOSE 149*   < > 142* 146* 167* 184* 162*  BUN 69*   < > 46* 41* 38* 42* 43*  CREATININE 4.05*   < > 2.98* 2.68* 2.38* 2.49* 2.44*  CALCIUM 8.5*   < > 8.3* 8.7* 8.6* 8.9 8.7*  PHOS 3.4  --   --   --   --   --   --    < > = values in this interval not displayed.   GFR: Estimated Creatinine Clearance: 34 mL/min (A) (by C-G formula based on SCr of 2.44 mg/dL (H)). Liver Function Tests: Recent Labs  Lab 04/06/19 2140  ALBUMIN 2.0*   No results for input(s): LIPASE, AMYLASE in the last 168 hours. No results for input(s): AMMONIA in the last 168 hours. Coagulation Profile: No results for input(s): INR, PROTIME in the last 168 hours. Cardiac Enzymes: No results for input(s): CKTOTAL, CKMB, CKMBINDEX, TROPONINI in the last 168 hours. BNP (last 3 results) No results for input(s): PROBNP in the last 8760 hours. HbA1C: No results for input(s): HGBA1C in the last 72 hours. CBG: Recent Labs  Lab 04/12/19 1948 04/12/19 2359 04/13/19 0413 04/13/19 0742 04/13/19 1213  GLUCAP 228* 172* 168* 160* 167*   Lipid Profile: No results for input(s): CHOL, HDL, LDLCALC, TRIG, CHOLHDL, LDLDIRECT in the last 72 hours. Thyroid Function Tests: No results for input(s): TSH, T4TOTAL, FREET4, T3FREE, THYROIDAB in the last 72 hours. Anemia Panel: No results for input(s): VITAMINB12, FOLATE, FERRITIN, TIBC, IRON, RETICCTPCT in the last 72 hours. Sepsis Labs: No results for input(s): PROCALCITON, LATICACIDVEN in the last 168 hours.  No results found for this or any previous visit (from the past 240 hour(s)).       Radiology Studies: No results found.      Scheduled Meds: . amLODipine  10 mg Per Tube Daily  . chlorhexidine  15 mL Mouth Rinse BID  . chlorhexidine gluconate (MEDLINE KIT)  15 mL Mouth Rinse BID  . Chlorhexidine Gluconate Cloth  6 each Topical Daily  . feeding supplement (PRO-STAT SUGAR FREE 64)  30 mL Per Tube TID  . free water  400 mL Per Tube Q3H  . heparin   5,000 Units Subcutaneous Q8H  . insulin aspart  0-9 Units Subcutaneous Q4H  . lacosamide  100 mg Per Tube BID  . levETIRAcetam  500 mg Oral BID  . mouth rinse  15 mL Mouth Rinse 10 times per day  . mouth rinse  15 mL Mouth Rinse q12n4p  . pantoprazole sodium  40 mg Per Tube Daily  . vancomycin variable dose per unstable renal function (pharmacist dosing)   Does not apply See admin instructions   Continuous Infusions: . sodium chloride  Stopped (04/02/19 0029)  . sodium chloride 10 mL/hr at 04/04/19 2000  . sodium chloride    . feeding supplement (VITAL 1.5 CAL) 1,000 mL (04/12/19 1605)     LOS: 13 days    Time spent: 35 mins      Shelly Coss, MD [facility] Pager [phone]  If 7PM-7AM, please contact night-coverage [email or web address] Password [email or web address] 04/13/2019, 1:30 PM

## 2019-04-13 NOTE — Progress Notes (Signed)
OT NOTE  RN STAFF  Please check splint every 4 hours during shift ( remove splint , remove stockinette/ dressing present) to assess for: * pain * redness *swelling  If any symptoms above present remove splint for 15 minutes. If symptoms continue - keep the splint removed and notify OT staff [phone] immediately.   Keep the UE elevated at all times on pillows / towels.  Splint can be cleaned with warm soapy water and alcohol swab. Splint should not be placed in heat of any kind because the splint with mold into a new shape.    Ramirez-Perez, OTR/L Acute Rehab Pager: [phone] Office: [phone]

## 2019-04-13 NOTE — Progress Notes (Signed)
[facility]    NEPHROLOGY PROGRESS NOTE  SUBJECTIVE:   Seen and examined.  Awake but doesn't respond to questions.  Family meeting noted today.     OBJECTIVE:  Vitals:   04/13/19 0800 04/13/19 1200  BP: (!) 137/92 (!) 144/96  Pulse: (!) 115 (!) 111  Resp: (!) 23 19  Temp: 98.1 F (36.7 C) 98.3 F (36.8 C)  SpO2: 91% 94%    Intake/Output Summary (Last 24 hours) at 04/13/2019 1340 Last data filed at 04/13/2019 0000 Gross per 24 hour  Intake -  Output 1750 ml  Net -1750 ml      General: Confused, NAD Neck:  No JVD CV:  Heart RRR  Lungs: Coarse breath sounds bilaterally Abd:  abd SNT/ND with normal BS Extremities: no lower extremity edema  MEDICATIONS:  . amLODipine  10 mg Per Tube Daily  . chlorhexidine  15 mL Mouth Rinse BID  . chlorhexidine gluconate (MEDLINE KIT)  15 mL Mouth Rinse BID  . Chlorhexidine Gluconate Cloth  6 each Topical Daily  . feeding supplement (PRO-STAT SUGAR FREE 64)  30 mL Per Tube TID  . free water  400 mL Per Tube Q3H  . heparin  5,000 Units Subcutaneous Q8H  . insulin aspart  0-9 Units Subcutaneous Q4H  . lacosamide  100 mg Per Tube BID  . levETIRAcetam  500 mg Oral BID  . mouth rinse  15 mL Mouth Rinse 10 times per day  . mouth rinse  15 mL Mouth Rinse q12n4p  . pantoprazole sodium  40 mg Per Tube Daily  . vancomycin variable dose per unstable renal function (pharmacist dosing)   Does not apply See admin instructions       LABS:   CBC Latest Ref Rng & Units 04/13/2019 04/08/2019 04/06/2019  WBC 4.0 - 10.5 K/uL 9.4 7.7 7.6  Hemoglobin 13.0 - 17.0 g/dL 12.1(L) 11.6(L) 11.2(L)  Hematocrit 39.0 - 52.0 % 36.4(L) 35.9(L) 35.9(L)  Platelets 150 - 400 K/uL 249 205 301    CMP Latest Ref Rng & Units 04/13/2019 04/12/2019 04/11/2019  Glucose 70 - 99 mg/dL 162(H) 184(H) 167(H)  BUN 8 - 23 mg/dL 43(H) 42(H) 38(H)  Creatinine 0.61 - 1.24 mg/dL 2.44(H) 2.49(H) 2.38(H)  Sodium 135 - 145 mmol/L 153(H) 152(H) 146(H)  Potassium 3.5 -  5.1 mmol/L 4.1 4.4 3.8  Chloride 98 - 111 mmol/L 120(H) 121(H) 117(H)  CO2 22 - 32 mmol/L 21(L) 21(L) 22  Calcium 8.9 - 10.3 mg/dL 8.7(L) 8.9 8.6(L)  Total Protein 6.5 - 8.1 g/dL - - -  Total Bilirubin 0.3 - 1.2 mg/dL - - -  Alkaline Phos 38 - 126 U/L - - -  AST 15 - 41 U/L - - -  ALT 0 - 44 U/L - - -    Lab Results  Component Value Date   CALCIUM 8.7 (L) 04/13/2019   CAION 1.21 04/04/2019   PHOS 3.4 04/06/2019       Component Value Date/Time   COLORURINE STRAW (A) 04/01/2019 1424   APPEARANCEUR CLEAR 04/01/2019 1424   LABSPEC 1.006 04/01/2019 1424   PHURINE 6.0 04/01/2019 1424   GLUCOSEU NEGATIVE 04/01/2019 1424   HGBUR MODERATE (A) 04/01/2019 1424   BILIRUBINUR NEGATIVE 04/01/2019 1424   KETONESUR NEGATIVE 04/01/2019 1424   PROTEINUR 100 (A) 04/01/2019 1424   UROBILINOGEN 1.0 12/20/2010 2007   NITRITE NEGATIVE 04/01/2019 1424   LEUKOCYTESUR NEGATIVE 04/01/2019 1424      Component Value Date/Time   PHART 7.424 04/04/2019 0435  PCO2ART 35.7 04/04/2019 0435   PO2ART 74.0 (L) 04/04/2019 0435   HCO3 23.3 04/04/2019 0435   TCO2 24 04/04/2019 0435   ACIDBASEDEF 1.0 04/04/2019 0435   O2SAT 95.0 04/04/2019 0435    No results found for: IRON, TIBC, FERRITIN, IRONPCTSAT     ASSESSMENT/PLAN:     1. AKI, previously required dialysis, etiology contrast nephropathy, improving excellent UOP and downtrending creatinine consistent with recovery.  Dialysis catheter removed. 2. Hypernatremia, likely solute diuresis. Uosm 313.  DC'ed D5W, then serum Na worsened.  On FWF 437m q3 hours.  Repeat Uosm pending, will restart D5.   3. Encephalopathy.  likley has a chronic component 4. Acute hypoxic respiratory failure, self extubation 9/5, stable on nasal cannula 5. Hypertension, on amlodipine, stable 6. SNF resident 7. Schizophrenia 8. Chart history of dementia 9. History of left CVA 10. Dispo: family meeting today

## 2019-04-13 NOTE — Progress Notes (Signed)
Occupational Therapy Treatment Patient Details Name: Chad Mcclure MRN: [id number] DOB: 05/01/1956 Today's Date: 04/13/2019    History of present illness Chad Mcclure is a 63 y.o. male who has a PMH including but not limited to HTN, HLD, HCV, Left CVA, DM, schizophrenia, dementia and who [facility] [place].  He was admitted to [facility] on 03/18/19 with fever due PNA, required intubated on 8/21 for hypoxic respiratory failure, extubated 8/29 then required re-intubation 8/31 for staus epilepticus then [facility].   OT comments  Pt continues to present with decreased cognition, balance, strength, and arousal. Pt requiring Total A +2 for bed mobility and to maintain sitting balance at EOB. Pt requiring Max hand over hand to perform grooming task and demonstrated decreased occupational participation. Doffed left resting hand splint and performed skin check; no noted red areas or edema; noted increased ROM for extension of left hand. Continue to recommend dc to SNF and will continue to follow acutely as admitted.    @14 :00 - Returned to provide RN with splint schedule and educate on wear schedule, donning/doffing, and cleaning. Donned splint and repositioned.    Follow Up Recommendations  SNF    Equipment Recommendations  Other (comment)(Defer to next venue)    Recommendations for Other Services PT consult    Precautions / Restrictions Precautions Precautions: Fall;Other (comment) Precaution Comments: NG tube, restraints Restrictions Weight Bearing Restrictions: No       Mobility Bed Mobility Overal bed mobility: Needs Assistance Bed Mobility: Supine to Sit;Sit to Supine     Supine to sit: Total assist;+2 for physical assistance;HOB elevated Sit to supine: Total assist;+2 for physical assistance;HOB elevated   General bed mobility comments: Total A for all aspects of bed mobility without activation or assist.  Transfers                  General transfer comment: Deferred for safety due to decreased arousal    Balance Overall balance assessment: Needs assistance Sitting-balance support: Feet supported;No upper extremity supported Sitting balance-Leahy Scale: Poor Sitting balance - Comments: mostly Max A for sitting balance with moments of Min guard assist but with such poor arousal, pt not able to sustain attention/alertness or balance. No balance reactions noted. Practiced weight shifting onto right/left forearms to attempt activation of UEs and trunk. Performed cervical AROM. Postural control: (multi directional)                                 ADL either performed or assessed with clinical judgement   ADL Overall ADL's : Needs assistance/impaired     Grooming: Wash/dry face;Maximal assistance;Sitting Grooming Details (indicate cue type and reason): Max A for sitting balance. Max hand over hand to bring wash clothe to face. Pt pushing back into extension with stimulation to face                               General ADL Comments: Pt continues to present with poor arousal, cognition, balance, strength, ang activity tolerance     Vision       Perception     Praxis      Cognition Arousal/Alertness: Lethargic Behavior During Therapy: Flat affect Overall Cognitive Status: Difficult to assess  General Comments: Pt not following any commands today. Eyes remained closed for most of session.        Exercises Other Exercises Other Exercises: Doffed resting hand splint and performed skin check. No noted red areas or edema   Shoulder Instructions       General Comments VSS throughout on 5L O2    Pertinent Vitals/ Pain       Pain Assessment: Faces Faces Pain Scale: Hurts little more Pain Location: grimaces with head movements Pain Descriptors / Indicators: Grimacing Pain Intervention(s): Monitored during session;Limited activity  within patient's tolerance;Repositioned  Home Living                                          Prior Functioning/Environment              Frequency  Min 2X/week        Progress Toward Goals  OT Goals(current goals can now be found in the care plan section)  Progress towards OT goals: Progressing toward goals  Acute Rehab OT Goals Patient Stated Goal: unable OT Goal Formulation: Patient unable to participate in goal setting Time For Goal Achievement: 04/19/19 Potential to Achieve Goals: Good ADL Goals Pt Will Perform Grooming: with min assist;sitting Pt Will Transfer to Toilet: with min assist;stand pivot transfer;bedside commode Additional ADL Goal #1: Pt will perform bed mobility with Min A in preparation for ADLs Additional ADL Goal #2: Pt will demonstrate selective attention to perform ADL in distracting environment with Min cues  Plan Discharge plan remains appropriate    Co-evaluation    PT/OT/SLP Co-Evaluation/Treatment: Yes Reason for Co-Treatment: For patient/therapist safety;To address functional/ADL transfers PT goals addressed during session: Mobility/safety with mobility;Balance OT goals addressed during session: ADL's and self-care      AM-PAC OT "6 Clicks" Daily Activity     Outcome Measure   Help from another person eating meals?: Total Help from another person taking care of personal grooming?: A Lot Help from another person toileting, which includes using toliet, bedpan, or urinal?: A Lot Help from another person bathing (including washing, rinsing, drying)?: A Lot Help from another person to put on and taking off regular upper body clothing?: A Lot Help from another person to put on and taking off regular lower body clothing?: Total 6 Click Score: 10    End of Session Equipment Utilized During Treatment: Oxygen(5L)  OT Visit Diagnosis: Unsteadiness on feet (R26.81);Other abnormalities of gait and mobility (R26.89);Muscle  weakness (generalized) (M62.81);Other symptoms and signs involving cognitive function;Hemiplegia and hemiparesis Hemiplegia - Right/Left: Left Hemiplegia - dominant/non-dominant: (Unsure) Hemiplegia - caused by: Cerebral infarction   Activity Tolerance Patient tolerated treatment well   Patient Left in bed;with call bell/phone within reach;with bed alarm set;with SCD's reapplied   Nurse Communication Mobility status        Time: 1000-1023 OT Time Calculation (min): 23 min  Charges: OT General Charges $OT Visit: 1 Visit OT Treatments $Self Care/Home Management : 8-22 mins  [place], OTR/L Acute Rehab Pager: [phone] Office: [place] 04/13/2019, 1:56 PM

## 2019-04-13 NOTE — Consult Note (Signed)
Consultation Note Date: 04/13/2019   Patient Name: Chad Mcclure  DOB: 1955/11/23  MRN: [id number]  Age / Sex: 63 y.o., male  PCP: Patient, No Pcp Per Referring Physician: Shelly Coss, MD  Reason for Consultation: Establishing goals of care and Psychosocial/spiritual support  HPI/Patient Profile: 63 y.o. male  with past medical history of CVA with residual left hemi paresis in 2012, DM, HCV, dementia, possible schizophrenia who was admitted on 03/31/2019 in transfer from [facility].  He was admitted to [facility] 8/20 with cystitis and pneumonia.  He required intubation.  He was extubated on 8/29 but suffered gross aspiration and status epilepticus and was re-intubated.  He was transferred to [facility].  He had contrast nephropathy and acute renal failure requiring temporary hemodialysis.  On 9/5 he self extubated.  He has been able to discontinue hemodialysis and is urinating well (creatinine 2.4). His mental status remains altered, he has failed his swallow studies and is having difficulty managing his secretions.  He is currently receiving nutrition thru a cor trak.  Sodium remains elevated in the 150s.  Clinical Assessment and Goals of Care:  I have reviewed medical records including EPIC notes, labs and imaging, received report from the care team, assessed the patient and then met at the bedside along with his daughter [name] as well as his son [name] (on face time)  to discuss diagnosis prognosis, [place], EOL wishes, disposition and options.  I introduced Palliative Medicine as specialized medical care for people living with serious illness. It focuses on providing relief from the symptoms and stress of a serious illness. The goal is to improve quality of life for both the patient and the family.  We discussed a brief life review of the patient.  He worked on a Manufacturing systems engineer.  He had substance  abuse difficulties.  He had a stroke at a young age and has lived in a nursing home in [place] for the past 8 years.  At the nursing home he was able to speak, eat, and move about in a wheel chair.  His family laments that they did not see him often as they live in [place] and he is in [place].   [facility]).  We discussed his current illness and what it means in the larger context of his on-going co-morbidities.  Natural disease trajectory and expectations at EOL were discussed.  We talked about his bed bound status and in ability to swallow safely.    We discussed two path to consider from this point - (1) [place] comfort and understanding that he would pass away likely within a couple of weeks; (2) PEG placement and SNF.  The second path would likely include recurrent aspiration pneumonia and wounds along with continued decline.  This was a difficult conversation particularly for Mr. Conrad's son.  His daughter [name] was an Therapist, sports with [employer] for 8 years.  She supports the Hospice path as she is concerned his quality of life has been poor for some time and  now it would be much worse.  She also supports DNR.  Mr. Pellow son [name] is unable to make a decision and wants to visit his father in person.  He supports full code and wants his father to be resuscitated.  (This conversation hit [place] hard).  The two siblings would like to have an opportunity to discuss the options and re-meet at the hospital tomorrow.   [name] and Warren would like to come to bedside together.  Hospice and Palliative Care services outpatient were explained and offered.  Questions and concerns were addressed.  Hard Choices booklet left for review. The family was encouraged to call with questions or concerns.    Primary Decision Maker:  NEXT OF KIN Son and Dtr, but [name] seems to defer to her brother.    SUMMARY OF RECOMMENDATIONS    His has two children who disagree on the  path to take.  Monica who was a Therapist, nutritional wants [employer].  [name] wants Full Code and is considering SNF vs Hospice House.  PMT will re-meet with the son/dtr at bedside on 9/16.   Family will call PMT office to set a time.  Code Status/Advance Care Planning:  Full   Symptom Management:   Per primary.  Patient requiring frequent suctioning of secretions.  Additional Recommendations (Limitations, Scope, Preferences):  Full Scope Treatment  Palliative Prophylaxis:   Frequent Pain Assessment  Prognosis:  Pending goals of care decisions.  If they opt for no feeding tube the patient will have a prognosis of two weeks or less and be eligible for hospice house.    Discharge Planning: To Be Determined      Primary Diagnoses: Present on Admission: . Aspiration pneumonia ([place])   I have reviewed the medical record, interviewed the patient and family, and examined the patient. The following aspects are pertinent.  No past medical history on file. Social History   Socioeconomic History  . Marital status: Divorced    Spouse name: Not on file  . Number of children: Not on file  . Years of education: Not on file  . Highest education level: Not on file  Occupational History  . Not on file  Social Needs  . Financial resource strain: Not on file  . Food insecurity    Worry: Not on file    Inability: Not on file  . Transportation needs    Medical: Not on file    Non-medical: Not on file  Tobacco Use  . Smoking status: Not on file  Substance and Sexual Activity  . Alcohol use: Not on file  . Drug use: Not on file  . Sexual activity: Not on file  Lifestyle  . Physical activity    Days per week: Not on file    Minutes per session: Not on file  . Stress: Not on file  Relationships  . Social Herbalist on phone: Not on file    Gets together: Not on file    Attends religious service: Not on file    Active member of club or organization: Not on file    Attends  meetings of clubs or organizations: Not on file    Relationship status: Not on file  Other Topics Concern  . Not on file  Social History Narrative  . Not on file   No family history on file. Scheduled Meds: . amLODipine  10 mg Per Tube Daily  . chlorhexidine  15 mL Mouth Rinse BID  . chlorhexidine  gluconate (MEDLINE KIT)  15 mL Mouth Rinse BID  . Chlorhexidine Gluconate Cloth  6 each Topical Daily  . feeding supplement (PRO-STAT SUGAR FREE 64)  30 mL Per Tube TID  . free water  400 mL Per Tube Q3H  . heparin  5,000 Units Subcutaneous Q8H  . insulin aspart  0-9 Units Subcutaneous Q4H  . lacosamide  100 mg Per Tube BID  . levETIRAcetam  500 mg Oral BID  . mouth rinse  15 mL Mouth Rinse 10 times per day  . mouth rinse  15 mL Mouth Rinse q12n4p  . metoprolol tartrate  12.5 mg Oral BID  . pantoprazole sodium  40 mg Per Tube Daily  . vancomycin variable dose per unstable renal function (pharmacist dosing)   Does not apply See admin instructions   Continuous Infusions: . dextrose    . feeding supplement (VITAL 1.5 CAL) 1,000 mL (04/12/19 1605)   PRN Meds:.acetaminophen (TYLENOL) oral liquid 160 mg/5 mL, fentaNYL (SUBLIMAZE) injection, hydrALAZINE, LORazepam, morphine injection, sodium chloride flush Not on File Review of Systems patient unable to provide.  Physical Exam  Well developed chronically ill male, awake, lethargic, recognizes his children  CV tachy resp frequent coughing and attempts to clear secretions Abdomen soft, nt  Vital Signs: BP (!) 144/96   Pulse (!) 111   Temp 98.3 F (36.8 C) (Oral)   Resp 19   Ht 6' (1.829 m)   Wt 85.1 kg   SpO2 94%   BMI 25.44 kg/m  Pain Scale: PAINAD   Pain Score: 0-No pain   SpO2: SpO2: 94 % O2 Device:SpO2: 94 % O2 Flow Rate: .O2 Flow Rate (L/min): 5 L/min  IO: Intake/output summary:   Intake/Output Summary (Last 24 hours) at 04/13/2019 1347 Last data filed at 04/13/2019 0000 Gross per 24 hour  Intake -  Output 1750 ml   Net -1750 ml    LBM: Last BM Date: 04/08/19 Baseline Weight: Weight: 92.5 kg Most recent weight: Weight: 85.1 kg     Palliative Assessment/Data: 20%     Time In: 1:00 Time Out: 2:10 Time Total: 70 min. Visit consisted of counseling and education dealing with the complex and emotionally intense issues surrounding the need for palliative care and symptom management in the setting of serious and potentially life-threatening illness. Greater than 50%  of this time was spent counseling and coordinating care related to the above assessment and plan.  Signed by: Florentina Jenny, PA-C Palliative Medicine Pager: [phone]  Please contact Palliative Medicine Team phone at [phone] for questions and concerns.  For individual provider: See Shea Evans

## 2019-04-13 NOTE — Progress Notes (Signed)
Physical Therapy Treatment Patient Details Name: Chad Mcclure MRN: [id number] DOB: Dec 10, 1955 Today's Date: 04/13/2019    History of Present Illness Chad Mcclure is a 63 y.o. male who has a PMH including but not limited to HTN, HLD, HCV, Left CVA, DM, schizophrenia, dementia and who resides at [facility] in [place].  He was admitted to [facility] on 03/18/19 with fever due PNA, required intubated on 8/21 for hypoxic respiratory failure, extubated 8/29 then required re-intubation 8/31 for staus epilepticus then transferred to [facility] 9/2.    PT Comments    Patient lethargic and drowsy during session. Eyes remained closed. Total A for all aspects of mobility with no initiation or activation. Requires Max A mostly for sitting balance with moments of Min guard but not able to sustain due to arousal/weakness/attention. Pt not following any commands today. VSS throughout. Will continue to follow.    Follow Up Recommendations  SNF     Equipment Recommendations  None recommended by PT    Recommendations for Other Services       Precautions / Restrictions Precautions Precautions: Fall;Other (comment) Precaution Comments: NG tube, restraints Restrictions Weight Bearing Restrictions: No    Mobility  Bed Mobility Overal bed mobility: Needs Assistance Bed Mobility: Supine to Sit;Sit to Supine     Supine to sit: Total assist;+2 for physical assistance;HOB elevated Sit to supine: Total assist;+2 for physical assistance;HOB elevated   General bed mobility comments: Total A for all aspects of bed mobility without activation or assist.  Transfers                 General transfer comment: deferred  Ambulation/Gait                 Stairs             Wheelchair Mobility    Modified Rankin (Stroke Patients Only)       Balance Overall balance assessment: Needs assistance Sitting-balance support: Feet supported;No upper extremity supported Sitting  balance-Leahy Scale: Poor Sitting balance - Comments: mostly Max A for sitting balance with moments of Min guard assist but with such poor arousal, pt not able to sustain attention/alertness or balance. No balance reactions noted. Practiced weight shifting onto right/left forearms to attempt activation of UEs and trunk. Performed cervical AROM. Postural control: (multi directional)                                  Cognition Arousal/Alertness: Lethargic Behavior During Therapy: Flat affect Overall Cognitive Status: Difficult to assess                                 General Comments: Pt not following any commands today. Eyes remained closed for most of session.      Exercises      General Comments General comments (skin integrity, edema, etc.): VSS throughout on 5L/min 02.      Pertinent Vitals/Pain Pain Assessment: Faces Faces Pain Scale: Hurts little more Pain Location: grimaces with head movements Pain Descriptors / Indicators: Grimacing Pain Intervention(s): Repositioned;Monitored during session    Home Living                      Prior Function            PT Goals (current goals can now be found in the care plan  section) Progress towards PT goals: Not progressing toward goals - comment(arousal)    Frequency    Min 2X/week      PT Plan Current plan remains appropriate    Co-evaluation PT/OT/SLP Co-Evaluation/Treatment: Yes Reason for Co-Treatment: Necessary to address cognition/behavior during functional activity;Complexity of the patient's impairments (multi-system involvement);For patient/therapist safety;To address functional/ADL transfers PT goals addressed during session: Mobility/safety with mobility;Balance        AM-PAC PT "6 Clicks" Mobility   Outcome Measure  Help needed turning from your back to your side while in a flat bed without using bedrails?: Total Help needed moving from lying on your back to sitting  on the side of a flat bed without using bedrails?: Total Help needed moving to and from a bed to a chair (including a wheelchair)?: Total Help needed standing up from a chair using your arms (e.g., wheelchair or bedside chair)?: Total Help needed to walk in hospital room?: Total Help needed climbing 3-5 steps with a railing? : Total 6 Click Score: 6    End of Session Equipment Utilized During Treatment: Oxygen Activity Tolerance: Patient tolerated treatment well(VSS) Patient left: in bed;with call bell/phone within reach;with bed alarm set;with restraints reapplied Nurse Communication: Mobility status;Need for lift equipment PT Visit Diagnosis: Unsteadiness on feet (R26.81);Muscle weakness (generalized) (M62.81);Difficulty in walking, not elsewhere classified (R26.2)     Time: 1000-1023 PT Time Calculation (min) (ACUTE ONLY): 23 min  Charges:  $Therapeutic Activity: 8-22 mins                     Mylo RedShauna Poseidon Pam, PT, DPT Acute Rehabilitation Services Pager [phone] Office [phone]       Blake DivineShauna A Lanier Ensign[place]shorne 04/13/2019, 12:56 PM

## 2019-04-13 NOTE — Progress Notes (Signed)
Nutrition Follow-up  DOCUMENTATION CODES:   Not applicable  INTERVENTION:  Discontinue Vital 1.5 formula.  Initiate Osmolite 1.5 formula @ 35 ml/hr via Cortrak NGT and increase by 10 ml every 4 hours to goal rate of 55 ml/hr.   Provide 30 ml Prostat BID per tube.   Free water flushes of 400 ml every 3 hours per MD.   Tube feeding regimen provides 2180 kcal (100% of needs), 113 grams of protein, and 4203 ml of H2O.   NUTRITION DIAGNOSIS:   Inadequate oral intake related to inability to eat as evidenced by NPO status; ongoing  GOAL:   Patient will meet greater than or equal to 90% of their needs ; met with TF  MONITOR:   TF tolerance, Weight trends, Labs, I & O's, Skin  REASON FOR ASSESSMENT:   Ventilator, Consult Enteral/tube feeding initiation and management  ASSESSMENT:   63 year old male who presented on 9/02 from [place] (admitted 8/20) with fevers suspected to be related to sepsis secondary to pneumonia and cystitis. Pt was intubated 8/21 and failed extubation after gross aspiration and status epilepticus which prompted transfer to [facility]. PMH of HTN, HLD, left CVA, DM, hepatitis C, schizophrenia, dementia. Pt off dialysis. Pt with MRSA PNA. Extubated 9/6. Encephalopathy secondary to seizures. Cortrak NGT placed 9/11.   Palliative consulted for goals of care. Ongoing discussion for possible PEG tube placement vs hospice care. RD to modify tube feed orders and transition to standard tube feeding formula as pt has been tolerating his feeds.   Labs and medications reviewed.   Diet Order:   Diet Order            Diet NPO time specified  Diet effective now              EDUCATION NEEDS:   No education needs have been identified at this time  Skin:  Skin Assessment: Reviewed RN Assessment  Last BM:  9/10  Height:   Ht Readings from Last 1 Encounters:  03/31/19 6' (1.829 m)    Weight:   Wt Readings from Last 1 Encounters:  04/13/19 85.1 kg     Ideal Body Weight:  80.9 kg  BMI:  Body mass index is 25.44 kg/m.  Estimated Nutritional Needs:   Kcal:  2100-2300  Protein:  110-125 grams  Fluid:  >/= 2  L/day    Chad Parker, MS, RD, LDN Pager # [phone] After hours/ weekend pager # [phone]

## 2019-04-14 LAB — GLUCOSE, CAPILLARY
Glucose-Capillary: 155 mg/dL — ABNORMAL HIGH (ref 70–99)
Glucose-Capillary: 160 mg/dL — ABNORMAL HIGH (ref 70–99)
Glucose-Capillary: 159 mg/dL — ABNORMAL HIGH (ref 70–99)
Glucose-Capillary: 168 mg/dL — ABNORMAL HIGH (ref 70–99)
Glucose-Capillary: 172 mg/dL — ABNORMAL HIGH (ref 70–99)
Glucose-Capillary: 165 mg/dL — ABNORMAL HIGH (ref 70–99)

## 2019-04-14 LAB — BASIC METABOLIC PANEL
Anion gap: 8 (ref 5–15)
BUN: 33 mg/dL — ABNORMAL HIGH (ref 8–23)
CO2: 22 mmol/L (ref 22–32)
Calcium: 8.2 mg/dL — ABNORMAL LOW (ref 8.9–10.3)
Chloride: 113 mmol/L — ABNORMAL HIGH (ref 98–111)
Creatinine, Ser: 2.24 mg/dL — ABNORMAL HIGH (ref 0.61–1.24)
GFR calc Af Amer: 35 mL/min — ABNORMAL LOW (ref >60)
GFR calc non Af Amer: 30 mL/min — ABNORMAL LOW (ref >60)
Glucose, Bld: 171 mg/dL — ABNORMAL HIGH (ref 70–99)
Potassium: 3.8 mmol/L (ref 3.5–5.1)
Sodium: 143 mmol/L (ref 135–145)

## 2019-04-14 MED ORDER — SENNOSIDES 8.8 MG/5ML PO SYRP
10.0000 mL | ORAL_SOLUTION | Freq: Every day | ORAL | Status: DC
  Administered 2019-04-15 – 2019-04-23 (×8): 10 mL
  Filled 2019-04-14 (×11): qty 10

## 2019-04-14 MED ORDER — OSMOLITE 1.5 CAL PO LIQD
1000.0000 mL | ORAL | Status: DC
  Administered 2019-04-14 – 2019-04-16 (×3): 1000 mL
  Filled 2019-04-14 (×12): qty 1000

## 2019-04-14 NOTE — Progress Notes (Signed)
[facility]    NEPHROLOGY PROGRESS NOTE  SUBJECTIVE:   Appreciate pall care with family meeting yesterday.     OBJECTIVE:  Vitals:   04/14/19 0800 04/14/19 1130  BP: 132/84 (!) 115/99  Pulse: 95 (!) 105  Resp: (!) 22 (!) 23  Temp:  99.5 F (37.5 C)  SpO2: 98% 96%    Intake/Output Summary (Last 24 hours) at 04/14/2019 1205 Last data filed at 04/14/2019 1749 Gross per 24 hour  Intake 3632.41 ml  Output 5875 ml  Net -2242.59 ml      General: Confused, NAD Neck:  No JVD CV:  Heart RRR  Lungs: Coarse breath sounds bilaterally Abd:  abd SNT/ND with normal BS Extremities: no lower extremity edema  MEDICATIONS:  . amLODipine  10 mg Per Tube Daily  . chlorhexidine  15 mL Mouth Rinse BID  . chlorhexidine gluconate (MEDLINE KIT)  15 mL Mouth Rinse BID  . Chlorhexidine Gluconate Cloth  6 each Topical Q0600  . feeding supplement (PRO-STAT SUGAR FREE 64)  30 mL Per Tube BID  . free water  400 mL Per Tube Q3H  . heparin  5,000 Units Subcutaneous Q8H  . insulin aspart  0-9 Units Subcutaneous Q4H  . lacosamide  100 mg Per Tube BID  . levETIRAcetam  500 mg Oral BID  . mouth rinse  15 mL Mouth Rinse 10 times per day  . mouth rinse  15 mL Mouth Rinse q12n4p  . metoprolol tartrate  12.5 mg Oral BID  . pantoprazole sodium  40 mg Per Tube Daily  . vancomycin variable dose per unstable renal function (pharmacist dosing)   Does not apply See admin instructions       LABS:   CBC Latest Ref Rng & Units 04/13/2019 04/08/2019 04/06/2019  WBC 4.0 - 10.5 K/uL 9.4 7.7 7.6  Hemoglobin 13.0 - 17.0 g/dL 12.1(L) 11.6(L) 11.2(L)  Hematocrit 39.0 - 52.0 % 36.4(L) 35.9(L) 35.9(L)  Platelets 150 - 400 K/uL 249 205 301    CMP Latest Ref Rng & Units 04/14/2019 04/13/2019 04/12/2019  Glucose 70 - 99 mg/dL 171(H) 162(H) 184(H)  BUN 8 - 23 mg/dL 33(H) 43(H) 42(H)  Creatinine 0.61 - 1.24 mg/dL 2.24(H) 2.44(H) 2.49(H)  Sodium 135 - 145 mmol/L 143 153(H) 152(H)  Potassium 3.5 - 5.1 mmol/L  3.8 4.1 4.4  Chloride 98 - 111 mmol/L 113(H) 120(H) 121(H)  CO2 22 - 32 mmol/L 22 21(L) 21(L)  Calcium 8.9 - 10.3 mg/dL 8.2(L) 8.7(L) 8.9  Total Protein 6.5 - 8.1 g/dL - - -  Total Bilirubin 0.3 - 1.2 mg/dL - - -  Alkaline Phos 38 - 126 U/L - - -  AST 15 - 41 U/L - - -  ALT 0 - 44 U/L - - -    Lab Results  Component Value Date   CALCIUM 8.2 (L) 04/14/2019   CAION 1.21 04/04/2019   PHOS 3.4 04/06/2019       Component Value Date/Time   COLORURINE STRAW (A) 04/01/2019 1424   APPEARANCEUR CLEAR 04/01/2019 1424   LABSPEC 1.006 04/01/2019 1424   PHURINE 6.0 04/01/2019 1424   GLUCOSEU NEGATIVE 04/01/2019 1424   HGBUR MODERATE (A) 04/01/2019 1424   BILIRUBINUR NEGATIVE 04/01/2019 1424   KETONESUR NEGATIVE 04/01/2019 1424   PROTEINUR 100 (A) 04/01/2019 1424   UROBILINOGEN 1.0 12/20/2010 2007   NITRITE NEGATIVE 04/01/2019 1424   LEUKOCYTESUR NEGATIVE 04/01/2019 1424      Component Value Date/Time   PHART 7.424 04/04/2019 0435  PCO2ART 35.7 04/04/2019 0435   PO2ART 74.0 (L) 04/04/2019 0435   HCO3 23.3 04/04/2019 0435   TCO2 24 04/04/2019 0435   ACIDBASEDEF 1.0 04/04/2019 0435   O2SAT 95.0 04/04/2019 0435    No results found for: IRON, TIBC, FERRITIN, IRONPCTSAT     ASSESSMENT/PLAN:     1. AKI, previously required dialysis, etiology contrast nephropathy, improving excellent UOP and downtrending creatinine consistent with recovery.  Dialysis catheter removed. 2. Hypernatremia:  Uosm 331 9/15 and calculated plasma osms 330--> isosthenuria with high obligate solute intake and no free water clearance by calculation--> he is experiencing a solute diuresis. On FWF 426m q3 hours and is s/p D5 with improvement.  TF have been changed to osmolite.  If hypernatremia occurs again, would restart D5.   3. Encephalopathy.  likley has a chronic component 4. Acute hypoxic respiratory failure, self extubation 9/5, stable on nasal cannula 5. Hypertension, on amlodipine, stable 6. SNF  resident 7. Schizophrenia 8. Chart history of dementia 9. History of left CVA 10. Family meetings ongoing.  Nothing really to add to care renal-wise.  Will sign off.  Please call with any questions.

## 2019-04-14 NOTE — Progress Notes (Signed)
PROGRESS NOTE    Chad Mcclure  YBO:[id number] DOB: 02/17/1956 DOA: 03/31/2019 PCP: Patient, No Pcp Per   Brief Narrative:  Patient is a 63 year old male with H/O HTN, HLD, HCV, Left CVA, DM, schizophrenia, dementia and who resides at SNF in [place] , who initially was admitted to [facility] on 8/20 with fevers.  He was suspected to have pneumonia and cystitis.  He required intubation on 8/21 for acute hypoxic respiratory failure.  Extubated on 8/29 did not require intubation on 8/31 for status epilepticus. He required initiation of hemodialysis after failing a lasix challenge, TTS schedule planned (R subclavian perm cath placed). He was transferred to [facility] on 9/2 and was under PCCM service.  He has been extubated now and transferred to hospitalist team on 04/06/2019.  He was managed for MRSA associated pneumonia, seizures.  Hospital course remarkable for persistent encephalopathy .  Nephrology was following for dialysis. Now off dialysis.We were waiting further improvement in the mental status so that he can be fed by mouth.  But unfortunately,he had hospitalization due to persistent encephalopathy, dysphagia.  Palliative care consulted  for goals of care discussion.  Plan for family meeting   for discussion on possible PEG tube placement versus hospice care.  Assessment & Plan:   Principal Problem:   Acute respiratory failure with hypoxia (HCC) Active Problems:   Status epilepticus ([place])   AKI (acute kidney injury) ([place])   Encephalopathy acute   Aspiration pneumonia (HCC)   Aspiration into airway   Palliative care encounter   Encounter for hospice care discussion   Encephalopathy:  Neurology was following.  EEG on 04/01/2019 showed severe encephalopathy but no seizures.  Prolonged encephalopathy most likely secondary to prolonged seizure episodes/status epilepticus. Mental status has just slightly  improved.  He follows very simple commands and opens eyes when calling name .  Looks lethargic, deconditioned/debilitated and weak.  Acute hypoxic respiratory failure:Treated for MRSA pneumonia.  Had to be intubated twice.  Self extubated on 9/5.  Currently respiratory status stable on nasal cannula.  On 5 L of oxygen per minute. Continue pulmonary toileting.  MRSA pneumonia: Completed  the course of vancomycin.  Currently respiratory status is stable.  Seizure disorder: On Vimpat, Keppra.  Neurology was following.  AKI suspected secondary to contrast nephropathy: Nephrology following.  Started on dialysis on 8/24 at [facility]. He is having good urine output.  Expecting full renal recovery.  Nephrology was following.  He had permanent cath on the right chest which has been removed.  Hypernatremia:Improved today.On free water.D5 D/ced  Persistent Sinus tachycardia: Started on lowe dose metoprolol with response.    Hypertension: Continue blood pressure medicines.  Continue to monitor blood pressure  Dysphagia/protein calorie malnutrition: Dietitian following.  Currently on tube feeding. Speech therapy continues to recommend NPO.Anticipated improvement so that he can be fed by mouth.If not we have to put a PEG tube.Palliative care  will discuss with family about this.  Diabetes mellitus: Continue sliding scale insulin.  History of schizophrenia/dementia: Continue supportive care.  History of left CVA: Has residual weakness on the left side.  Skilled nursing facility resident.He had slurred speech on baseline.  Debility/deconditioning: PT/OT consulted and recommended skilled nursing facility.  Social worker made aware.  When he is medically ready for discharge, he will be discharged back to skilled nursing facility at [facility]. I have consulted palliative care today for direction of goals of care.  Patient will have a very poor quality of life given his multiple comorbidities,  history of previous stroke.  He is a hospice candidate  Nutrition Problem: Inadequate oral  intake Etiology: inability to eat      DVT prophylaxis: Heparin [place] Code Status: Full Family Communication: Called and talked to daughter on 03/08/19.Called daughter again on 04/14/19 but call not received.Palliative care doing meeting with daughter and son today Disposition Plan: Back to skilled nursing facility at [facility] when medically stable.Still on tube feeding.Might need PEG tube for feeding if family agrees.Difficult placement    Consultants: PCCM, neurology, nephrology  Procedures: Intubation, extubation, dialysis catheter placement  Antimicrobials:  Anti-infectives (From admission, onward)   Start     Dose/Rate Route Frequency Ordered Stop   04/07/19 1000  vancomycin (VANCOCIN) 500 mg in sodium chloride 0.9 % 100 mL IVPB     500 mg 100 mL/hr over 60 Minutes Intravenous  Once 04/07/19 0835 04/07/19 1026   04/04/19 0830  vancomycin (VANCOCIN) 1,500 mg in sodium chloride 0.9 % 500 mL IVPB     1,500 mg 250 mL/hr over 120 Minutes Intravenous  Once 04/04/19 0810 04/04/19 1127   04/03/19 1200  vancomycin (VANCOCIN) IVPB 1000 mg/200 mL premix  Status:  Discontinued     1,000 mg 200 mL/hr over 60 Minutes Intravenous Every T-Th-Sa (Hemodialysis) 04/01/19 1253 04/03/19 0744   04/03/19 0746  vancomycin variable dose per unstable renal function (pharmacist dosing)      Does not apply See admin instructions 04/03/19 0746     04/01/19 1400  vancomycin (VANCOCIN) 2,000 mg in sodium chloride 0.9 % 500 mL IVPB     2,000 mg 250 mL/hr over 120 Minutes Intravenous  Once 04/01/19 1253 04/01/19 1900   04/01/19 0800  meropenem (MERREM) 500 mg in sodium chloride 0.9 % 100 mL IVPB  Status:  Discontinued     500 mg 200 mL/hr over 30 Minutes Intravenous Every 24 hours 04/01/19 0059 04/03/19 0757   03/31/19 2315  piperacillin-tazobactam (ZOSYN) IVPB 3.375 g     3.375 g 100 mL/hr over 30 Minutes Intravenous  Once 03/31/19 2300 04/01/19 0031      Subjective:   Patient seen and examined at  bedside this morning.  Hemodynamically stable.  Mental status has not changed since yesterday.  Continues to remain encephalopathic, extremely deconditioned.  Objective: Vitals:   04/14/19 0600 04/14/19 0749 04/14/19 0800 04/14/19 1130  BP:  134/89 132/84 (!) 115/99  Pulse:  (!) 102 95 (!) 105  Resp:  (!) 22 (!) 22 (!) 23  Temp:  99.2 F (37.3 C)  99.5 F (37.5 C)  TempSrc:  Axillary  Axillary  SpO2:  98% 98% 96%  Weight: 82.3 kg     Height:        Intake/Output Summary (Last 24 hours) at 04/14/2019 1418 Last data filed at 04/14/2019 8182 Gross per 24 hour  Intake 3632.41 ml  Output 5875 ml  Net -2242.59 ml   Filed Weights   04/12/19 0500 04/13/19 0600 04/14/19 0600  Weight: 85.5 kg 85.1 kg 82.3 kg    Examination:   General exam:Not in distress, extremely deconditioned/debilitated HEENT:PERRL,Oral mucosa moist, Ear/Nose normal on gross exam, feeding tube Respiratory system: Bilateral decreased air entry in the bases, no wheezes or crackles Cardiovascular system: S1 & S2 heard, RRR. No JVD, murmurs, rubs, gallops or clicks. Gastrointestinal system: Abdomen is nondistended, soft and nontender. No organomegaly or masses felt. Normal bowel sounds heard. Central nervous system: Sleepy , confused, left residual hemiparesis with contractures.  Follows very simple commands and opens eyes and  tries to speak when called his name. extremities: No edema, no clubbing ,no cyanosis, distal peripheral pulses palpable. Skin: No rashes, lesions or ulcers,no icterus ,no pallor     Data Reviewed: I have personally reviewed following labs and imaging studies  CBC: Recent Labs  Lab 04/08/19 1202 04/13/19 0325  WBC 7.7 9.4  NEUTROABS 5.2 5.4  HGB 11.6* 12.1*  HCT 35.9* 36.4*  MCV 84.7 85.0  PLT 205 932   Basic Metabolic Panel: Recent Labs  Lab 04/10/19 0618 04/11/19 0838 04/12/19 0823 04/13/19 0325 04/14/19 0442  NA 147* 146* 152* 153* 143  K 3.6 3.8 4.4 4.1 3.8  CL 116*  117* 121* 120* 113*  CO2 21* 22 21* 21* 22  GLUCOSE 146* 167* 184* 162* 171*  BUN 41* 38* 42* 43* 33*  CREATININE 2.68* 2.38* 2.49* 2.44* 2.24*  CALCIUM 8.7* 8.6* 8.9 8.7* 8.2*   GFR: Estimated Creatinine Clearance: 37 mL/min (A) (by C-G formula based on SCr of 2.24 mg/dL (H)). Liver Function Tests: No results for input(s): AST, ALT, ALKPHOS, BILITOT, PROT, ALBUMIN in the last 168 hours. No results for input(s): LIPASE, AMYLASE in the last 168 hours. No results for input(s): AMMONIA in the last 168 hours. Coagulation Profile: No results for input(s): INR, PROTIME in the last 168 hours. Cardiac Enzymes: No results for input(s): CKTOTAL, CKMB, CKMBINDEX, TROPONINI in the last 168 hours. BNP (last 3 results) No results for input(s): PROBNP in the last 8760 hours. HbA1C: No results for input(s): HGBA1C in the last 72 hours. CBG: Recent Labs  Lab 04/13/19 2008 04/13/19 2336 04/14/19 0337 04/14/19 0748 04/14/19 1129  GLUCAP 138* 139* 155* 160* 159*   Lipid Profile: No results for input(s): CHOL, HDL, LDLCALC, TRIG, CHOLHDL, LDLDIRECT in the last 72 hours. Thyroid Function Tests: No results for input(s): TSH, T4TOTAL, FREET4, T3FREE, THYROIDAB in the last 72 hours. Anemia Panel: No results for input(s): VITAMINB12, FOLATE, FERRITIN, TIBC, IRON, RETICCTPCT in the last 72 hours. Sepsis Labs: No results for input(s): PROCALCITON, LATICACIDVEN in the last 168 hours.  No results found for this or any previous visit (from the past 240 hour(s)).       Radiology Studies: No results found.      Scheduled Meds: . amLODipine  10 mg Per Tube Daily  . chlorhexidine  15 mL Mouth Rinse BID  . chlorhexidine gluconate (MEDLINE KIT)  15 mL Mouth Rinse BID  . Chlorhexidine Gluconate Cloth  6 each Topical Q0600  . feeding supplement (PRO-STAT SUGAR FREE 64)  30 mL Per Tube BID  . free water  400 mL Per Tube Q3H  . heparin  5,000 Units Subcutaneous Q8H  . insulin aspart  0-9 Units  Subcutaneous Q4H  . lacosamide  100 mg Per Tube BID  . levETIRAcetam  500 mg Oral BID  . mouth rinse  15 mL Mouth Rinse 10 times per day  . mouth rinse  15 mL Mouth Rinse q12n4p  . metoprolol tartrate  12.5 mg Oral BID  . pantoprazole sodium  40 mg Per Tube Daily  . sennosides  10 mL Per Tube Daily  . vancomycin variable dose per unstable renal function (pharmacist dosing)   Does not apply See admin instructions   Continuous Infusions: . feeding supplement (OSMOLITE 1.5 CAL) 55 mL/hr at 04/14/19 0700     LOS: 14 days    Time spent: 35 mins      Shelly Coss, MD [facility] Pager [phone]  If 7PM-7AM, please contact night-coverage [email or web address]  Password [email or web address] 04/14/2019, 2:18 PM

## 2019-04-14 NOTE — Progress Notes (Signed)
Daily Progress Note   Patient Name: Chad Mcclure       Date: 04/14/2019 DOB: [birth year]  Age: 63 y.o. MRN#: [id number] Attending Physician: Shelly Coss, MD Primary Care Physician: Patient, No Pcp Per Admit Date: 03/31/2019  Reason for Consultation/Follow-up: Establishing goals of care  Subjective: Patient drowsy this afternoon. Will not follow commands or answer orientation questions. Appears comfortable and without s/s of distress. Receiving TF's via cortrak.  GOC:   Chart reviewed and assessment completed. Discussed with RN, Marge. Spoke with daughter [name] via telephone for f/u meeting planned for today. [name] is unable to be at the hospital this afternoon. [name] reports that her brother, [name], will be at the hospital this afternoon and would like to meet with me. [name] has PMT contact number and will call when he arrives. Also notified RN to page PMT NP when family arrives.  ADDENDUM 1700: No word from son or RN. PMT to follow.  Length of Stay: 14  Current Medications: Scheduled Meds:  . amLODipine  10 mg Per Tube Daily  . chlorhexidine  15 mL Mouth Rinse BID  . chlorhexidine gluconate (MEDLINE KIT)  15 mL Mouth Rinse BID  . Chlorhexidine Gluconate Cloth  6 each Topical Q0600  . feeding supplement (PRO-STAT SUGAR FREE 64)  30 mL Per Tube BID  . free water  400 mL Per Tube Q3H  . heparin  5,000 Units Subcutaneous Q8H  . insulin aspart  0-9 Units Subcutaneous Q4H  . lacosamide  100 mg Per Tube BID  . levETIRAcetam  500 mg Oral BID  . mouth rinse  15 mL Mouth Rinse 10 times per day  . mouth rinse  15 mL Mouth Rinse q12n4p  . metoprolol tartrate  12.5 mg Oral BID  . pantoprazole sodium  40 mg Per Tube Daily  . sennosides  10 mL Per Tube Daily  . vancomycin variable  dose per unstable renal function (pharmacist dosing)   Does not apply See admin instructions    Continuous Infusions: . feeding supplement (OSMOLITE 1.5 CAL) 55 mL/hr at 04/14/19 0700    PRN Meds: acetaminophen (TYLENOL) oral liquid 160 mg/5 mL, fentaNYL (SUBLIMAZE) injection, hydrALAZINE, LORazepam, morphine injection, sodium chloride flush  Physical Exam Vitals signs and nursing note reviewed.  Constitutional:      Appearance: He is ill-appearing.  HENT:     Head: Normocephalic and atraumatic.  Pulmonary:     Effort: No tachypnea, accessory muscle usage or respiratory distress.  Skin:    General: Skin is warm and dry.  Neurological:     Mental Status: He is easily aroused.     Comments: Drowsy, opens eyes to voice. Does not follow commands or answer orientation questions.   Psychiatric:        Attention and Perception: He is inattentive.        Cognition and Memory: Cognition is impaired.            Vital Signs: BP (!) 115/99 (BP Location: Left Arm)   Pulse (!) 105   Temp 99.5 F (37.5 C) (Axillary)   Resp (!) 23   Ht 6' (1.829 m)   Wt 82.3 kg   SpO2 96%   BMI 24.61 kg/m  SpO2: SpO2: 96 % O2 Device: O2 Device: Nasal Cannula O2 Flow Rate: O2 Flow Rate (L/min): 4 L/min  Intake/output summary:   Intake/Output Summary (Last 24 hours) at 04/14/2019 1320 Last data filed at 04/14/2019 3893 Gross per 24 hour  Intake 3632.41 ml  Output 5875 ml  Net -2242.59 ml   LBM: Last BM Date: 04/08/19 Baseline Weight: Weight: 92.5 kg Most recent weight: Weight: 82.3 kg       Palliative Assessment/Data: PPS 30%      Patient Active Problem List   Diagnosis Date Noted  . Aspiration into airway   . Palliative care encounter   . Encounter for hospice care discussion   . Aspiration pneumonia ([place]) 04/01/2019  . CVA (cerebral vascular accident) ([place]) 03/31/2019  . Acute respiratory failure with hypoxia ([place])   . Status epilepticus ([place])   . AKI (acute kidney injury) ([place])    . Encephalopathy acute   . Seizures [facility])     Palliative Care Assessment & Plan   Patient Profile: 63 y.o. male  with past medical history of CVA with residual left hemi paresis in 2012, DM, HCV, dementia, possible schizophrenia who was admitted on 03/31/2019 in transfer from [facility].  He was admitted to [facility] 8/20 with cystitis and pneumonia.  He required intubation.  He was extubated on 8/29 but suffered gross aspiration and status epilepticus and was re-intubated.  He was transferred to [facility].  He had contrast nephropathy and acute renal failure requiring temporary hemodialysis.  On 9/5 he self extubated.  He has been able to discontinue hemodialysis and is urinating well (creatinine 2.4). His mental status remains altered, he has failed his swallow studies and is having difficulty managing his secretions.  He is currently receiving nutrition via cortrak.  Assessment: Encephalopathy Status epilepticus Acute hypoxic respiratory failure MRSA pneumonia Seizure disorder AKI suspected secondary to contrast nephropathy Hypernatremia Hx of left CVA Hx of schizophrenia/dementia Debility and deconditioning Aspiration risk  Recommendations/Plan:  Continue FULL code/FULL scope. Family did not arrive to bedside on 9/16. PMT provider will continue to stay involved and discuss [place] with family. Will attempt f/u meeting 9/17.   Code Status: FULL   Code Status Orders  (From admission, onward)         Start     Ordered   03/31/19 2221  Full code  Continuous     03/31/19 2221        Code Status History    This patient has a current code status but no historical code status.   Advance Care Planning Activity    Advance Directive  Documentation     Most Recent Value  Type of Advance Directive  Healthcare Power of Attorney [Daughter Monica Hairston]  Pre-existing out of facility DNR order (yellow form or pink MOST form)  -  "MOST" Form in Place?  -       Prognosis:    Poor prognosis  Discharge Planning:  To Be Determined  Care plan was discussed with RN, daughter  Thank you for allowing the Palliative Medicine Team to assist in the care of this patient.   Time In: 1250 Time Out: 1320 Total Time 30 Prolonged Time Billed no      Greater than 50%  of this time was spent counseling and coordinating care related to the above assessment and plan.  Ihor Dow, DNP, FNP-C Palliative Medicine Team  Phone: [phone] Fax: [phone]  Please contact Palliative Medicine Team phone at [phone] for questions and concerns.

## 2019-04-15 LAB — GLUCOSE, CAPILLARY
Glucose-Capillary: 161 mg/dL — ABNORMAL HIGH (ref 70–99)
Glucose-Capillary: 146 mg/dL — ABNORMAL HIGH (ref 70–99)
Glucose-Capillary: 160 mg/dL — ABNORMAL HIGH (ref 70–99)
Glucose-Capillary: 119 mg/dL — ABNORMAL HIGH (ref 70–99)
Glucose-Capillary: 149 mg/dL — ABNORMAL HIGH (ref 70–99)

## 2019-04-15 NOTE — Progress Notes (Signed)
Physical Therapy Treatment Patient Details Name: Chad Mcclure MRN: [id number] DOB: [birth year] Today's Date: 04/15/2019    History of Present Illness Chad Mcclure is a 63 y.o. male who has a PMH including but not limited to HTN, HLD, HCV, Left CVA, DM, schizophrenia, dementia and who resides at [facility] in [place].  He was admitted to [facility] on 03/18/19 with fever due PNA, required intubated on 8/21 for hypoxic respiratory failure, extubated 8/29 then required re-intubation 8/31 for staus epilepticus then transferred to [facility] 9/2.    PT Comments    Patient with limited sitting tolerance and balance needing max support and having less arousal/attentiveness in sitting than in supine, but BP Stable.  Feel he will need capable 24/7 care at d/c.  PT to follow.    Follow Up Recommendations  SNF     Equipment Recommendations  None recommended by PT    Recommendations for Other Services       Precautions / Restrictions Precautions Precautions: Fall Precaution Comments: NG tube, restraints Required Braces or Orthoses: Other Brace Other Brace: resting hand splint L UE    Mobility  Bed Mobility Overal bed mobility: Needs Assistance Bed Mobility: Rolling;Sidelying to Sit;Sit to Sidelying Rolling: Mod assist Sidelying to sit: Max assist;+2 for safety/equipment     Sit to sidelying: +2 for physical assistance;Max assist General bed mobility comments: assisted to roll for hygiene in bed, assist to use rail with R hand to sit up,  Transfers                 General transfer comment: NT due to limited sitting balance/safety  Ambulation/Gait                 Stairs             Wheelchair Mobility    Modified Rankin (Stroke Patients Only)       Balance Overall balance assessment: Needs assistance Sitting-balance support: Feet supported;No upper extremity supported Sitting balance-Leahy Scale: Zero Sitting balance - Comments: mod to max A at EOB  about 2-3 minutes attempting to gain balance, intermittent level of assist from min to max A, pt with limited upright arousal/attention, BP WNL, but returned to supine due to decreased level of arousal                                    Cognition Arousal/Alertness: Awake/alert Behavior During Therapy: Flat affect Overall Cognitive Status: Difficult to assess Area of Impairment: Attention;Following commands                   Current Attention Level: Focused   Following Commands: Follows one step commands inconsistently;Follows one step commands with increased time              Exercises      General Comments        Pertinent Vitals/Pain Pain Assessment: Faces Faces Pain Scale: No hurt    Home Living                      Prior Function            PT Goals (current goals can now be found in the care plan section) Progress towards PT goals: Not progressing toward goals - comment    Frequency    Min 2X/week      PT Plan Current plan remains appropriate  Co-evaluation              AM-PAC PT "6 Clicks" Mobility   Outcome Measure  Help needed turning from your back to your side while in a flat bed without using bedrails?: Total Help needed moving from lying on your back to sitting on the side of a flat bed without using bedrails?: Total Help needed moving to and from a bed to a chair (including a wheelchair)?: Total Help needed standing up from a chair using your arms (e.g., wheelchair or bedside chair)?: Total Help needed to walk in hospital room?: Total Help needed climbing 3-5 steps with a railing? : Total 6 Click Score: 6    End of Session Equipment Utilized During Treatment: Oxygen Activity Tolerance: Patient limited by fatigue Patient left: in bed;with call bell/phone within reach;with bed alarm set   PT Visit Diagnosis: Unsteadiness on feet (R26.81);Muscle weakness (generalized) (M62.81);Difficulty in walking,  not elsewhere classified (R26.2)     Time: [id number] PT Time Calculation (min) (ACUTE ONLY): 17 min  Charges:  $Therapeutic Activity: 8-22 mins                     Chad LawlessCyndi Brendia Mcclure, South CarolinaPT Acute Rehabilitation Services [phone] 04/15/2019    Chad Mcclure 04/15/2019, 5:33 PM

## 2019-04-15 NOTE — Progress Notes (Signed)
PROGRESS NOTE    Chad Mcclure  AQT:[id number] DOB: March 16, 1956 DOA: 03/31/2019 PCP: Patient, No Pcp Per   Brief Narrative:  Patient is a 63 year old male with H/O HTN, HLD, HCV, Left CVA, DM, schizophrenia, dementia and who resides at SNF in [place] , who initially was admitted to [facility] on 8/20 with fevers.  He was suspected to have pneumonia and cystitis.  He required intubation on 8/21 for acute hypoxic respiratory failure.  Extubated on 8/29 did not require intubation on 8/31 for status epilepticus. He required initiation of hemodialysis after failing a lasix challenge, TTS schedule planned (R subclavian perm cath placed). He was transferred to [facility] on 9/2 and was under PCCM service.  He has been extubated now and transferred to hospitalist team on 04/06/2019.  He was managed for MRSA associated pneumonia, seizures.  Hospital course remarkable for persistent encephalopathy .  Nephrology was following for dialysis. Now off dialysis.We were waiting further improvement in the mental status so that he can be fed by mouth.  But unfortunately,he had hospitalization due to persistent encephalopathy, dysphagia.  Palliative care consulted  for goals of care discussion.  Plan for family meeting   for discussion on possible PEG tube placement versus hospice care.  Assessment & Plan:   Principal Problem:   Acute respiratory failure with hypoxia (HCC) Active Problems:   Status epilepticus ([place])   AKI (acute kidney injury) ([place])   Encephalopathy   Aspiration pneumonia (HCC)   Aspiration into airway   Palliative care encounter   Encounter for hospice care discussion   Encephalopathy:  Neurology was following.  EEG on 04/01/2019 showed severe encephalopathy but no seizures.  Prolonged encephalopathy most likely secondary to prolonged seizure episodes/status epilepticus. Mental status has just slightly  improved.  He follows very simple commands and opens eyes when calling name . Looks  lethargic, deconditioned/debilitated and weak.   Acute hypoxic respiratory failure:Treated for MRSA pneumonia.  Had to be intubated twice.  Self extubated on 9/5.  Currently respiratory status stable on nasal cannula.  On 5 L of oxygen per minute. Continue pulmonary toileting.  MRSA pneumonia: Completed  the course of vancomycin.  Currently respiratory status is stable.  Seizure disorder: On Vimpat, Keppra.  Neurology was following.  AKI suspected secondary to contrast nephropathy: Nephrology following.  Started on dialysis on 8/24 at [facility]. He is having good urine output.  Expecting full renal recovery.  Nephrology was following.  He had permanent cath on the right chest which has been removed.  Hypernatremia:Improved today.On free water.D5 D/ced  Persistent Sinus tachycardia: Started on lowe dose metoprolol with response.    Hypertension: Continue blood pressure medicines.  Continue to monitor blood pressure  Dysphagia/protein calorie malnutrition: Dietitian following.  Currently on tube feeding. Speech therapy continues to recommend NPO.Anticipated improvement so that he can be fed by mouth.If not we have to put a PEG tube.Palliative care  will discuss with family about this. -Continue Osmolite 1.5 at 55 mL an hour with 300 mL of water flushes every 3 hours -Awaiting family decision on PEG tube Versus transitioning to hospice  Diabetes mellitus: Continue sliding scale insulin.  History of schizophrenia/dementia: Continue supportive care.  History of left CVA: Has residual weakness on the left side.  Skilled nursing facility resident.He had slurred speech on baseline.  Debility/deconditioning: PT/OT consulted and recommended skilled nursing facility.  Social worker made aware.  When he is medically ready for discharge, he will be discharged back to skilled nursing facility at  [name]. I have consulted palliative care today for direction of goals of care.  Patient will have a very  poor quality of life given his multiple comorbidities, history of previous stroke.  He is a hospice candidate -Awaiting family decision on PEG tube Versus transitioning to hospice  Nutrition Problem: Inadequate oral intake Etiology: inability to eat  DVT prophylaxis: [facility] Code Status: Full Family Communication:  Dr Tawanna Solo daughter again on 04/14/19 but call not received.Palliative care doing meeting with daughter and son today Disposition Plan: Back to skilled nursing facility at [facility] when medically stable.Still on tube feeding. --Awaiting family decision on PEG tube Versus transitioning to hospice   Consultants: PCCM, neurology, nephrology  Procedures: Intubation, extubation, dialysis catheter placement  Antimicrobials:  Anti-infectives (From admission, onward)   Start     Dose/Rate Route Frequency Ordered Stop   04/07/19 1000  vancomycin (VANCOCIN) 500 mg in sodium chloride 0.9 % 100 mL IVPB     500 mg 100 mL/hr over 60 Minutes Intravenous  Once 04/07/19 0835 04/07/19 1026   04/04/19 0830  vancomycin (VANCOCIN) 1,500 mg in sodium chloride 0.9 % 500 mL IVPB     1,500 mg 250 mL/hr over 120 Minutes Intravenous  Once 04/04/19 0810 04/04/19 1127   04/03/19 1200  vancomycin (VANCOCIN) IVPB 1000 mg/200 mL premix  Status:  Discontinued     1,000 mg 200 mL/hr over 60 Minutes Intravenous Every T-Th-Sa (Hemodialysis) 04/01/19 1253 04/03/19 0744   04/03/19 0746  vancomycin variable dose per unstable renal function (pharmacist dosing)      Does not apply See admin instructions 04/03/19 0746     04/01/19 1400  vancomycin (VANCOCIN) 2,000 mg in sodium chloride 0.9 % 500 mL IVPB     2,000 mg 250 mL/hr over 120 Minutes Intravenous  Once 04/01/19 1253 04/01/19 1900   04/01/19 0800  meropenem (MERREM) 500 mg in sodium chloride 0.9 % 100 mL IVPB  Status:  Discontinued     500 mg 200 mL/hr over 30 Minutes Intravenous Every 24 hours 04/01/19 0059 04/03/19 0757   03/31/19 2315   piperacillin-tazobactam (ZOSYN) IVPB 3.375 g     3.375 g 100 mL/hr over 30 Minutes Intravenous  Once 03/31/19 2300 04/01/19 0031      Subjective:  --- Remains confused and disoriented -Tolerating tube feeding okay   Objective: Vitals:   04/15/19 0820 04/15/19 1200 04/15/19 1600 04/15/19 1849  BP: (!) 142/92 (!) 144/95 (!) 132/91 (!) 131/92  Pulse: 95 97 (!) 109 97  Resp: (!) 21 18 (!) 22 20  Temp: 98.9 F (37.2 C) 99 F (37.2 C) 98.4 F (36.9 C) 100.2 F (37.9 C)  TempSrc: Axillary Oral Oral Axillary  SpO2: 91% 100% 93% 95%  Weight:      Height:        Intake/Output Summary (Last 24 hours) at 04/15/2019 1857 Last data filed at 04/15/2019 1813 Gross per 24 hour  Intake 2043 ml  Output 3300 ml  Net -1257 ml   Filed Weights   04/13/19 0600 04/14/19 0600 04/15/19 0500  Weight: 85.1 kg 82.3 kg 80 kg    Examination:  General exam:Not in distress, extremely deconditioned/debilitated HEENT:PERRL,Oral mucosa moist,  NG tube [place]-oxygen 2L/min Respiratory system: Bilateral decreased air entry in the bases, no wheezes or crackles Cardiovascular system: S1 & S2 heard, RRR. No JVD, murmurs, rubs, gallops or clicks. Gastrointestinal system: Abdomen is nondistended, soft and nontender. No organomegaly or masses felt. Normal bowel sounds heard. Central nervous system: Sleepy ,  confused, left residual hemiparesis with contractures.  Follows very simple commands and opens eyes and tries to speak when called name. extremities:  , no clubbing ,no cyanosis, distal peripheral pulses palpable. Skin: No rashes, lesions or ulcers,no icterus ,no pallor  Data Reviewed:   CBC: Recent Labs  Lab 04/13/19 0325  WBC 9.4  NEUTROABS 5.4  HGB 12.1*  HCT 36.4*  MCV 85.0  PLT 932   Basic Metabolic Panel: Recent Labs  Lab 04/10/19 0618 04/11/19 0838 04/12/19 0823 04/13/19 0325 04/14/19 0442  NA 147* 146* 152* 153* 143  K 3.6 3.8 4.4 4.1 3.8  CL 116* 117* 121* 120* 113*  CO2 21* 22  21* 21* 22  GLUCOSE 146* 167* 184* 162* 171*  BUN 41* 38* 42* 43* 33*  CREATININE 2.68* 2.38* 2.49* 2.44* 2.24*  CALCIUM 8.7* 8.6* 8.9 8.7* 8.2*   GFR: Estimated Creatinine Clearance: 37 mL/min (A) (by C-G formula based on SCr of 2.24 mg/dL (H)). Liver Function Tests: No results for input(s): AST, ALT, ALKPHOS, BILITOT, PROT, ALBUMIN in the last 168 hours. No results for input(s): LIPASE, AMYLASE in the last 168 hours. No results for input(s): AMMONIA in the last 168 hours. Coagulation Profile: No results for input(s): INR, PROTIME in the last 168 hours. Cardiac Enzymes: No results for input(s): CKTOTAL, CKMB, CKMBINDEX, TROPONINI in the last 168 hours. BNP (last 3 results) No results for input(s): PROBNP in the last 8760 hours. HbA1C: No results for input(s): HGBA1C in the last 72 hours. CBG: Recent Labs  Lab 04/14/19 2322 04/15/19 0324 04/15/19 0817 04/15/19 1204 04/15/19 1636  GLUCAP 165* 161* 146* 160* 119*   Lipid Profile: No results for input(s): CHOL, HDL, LDLCALC, TRIG, CHOLHDL, LDLDIRECT in the last 72 hours. Thyroid Function Tests: No results for input(s): TSH, T4TOTAL, FREET4, T3FREE, THYROIDAB in the last 72 hours. Anemia Panel: No results for input(s): VITAMINB12, FOLATE, FERRITIN, TIBC, IRON, RETICCTPCT in the last 72 hours. Sepsis Labs: No results for input(s): PROCALCITON, LATICACIDVEN in the last 168 hours.  No results found for this or any previous visit (from the past 240 hour(s)).   Radiology Studies: No results found.  Scheduled Meds: . amLODipine  10 mg Per Tube Daily  . chlorhexidine  15 mL Mouth Rinse BID  . chlorhexidine gluconate (MEDLINE KIT)  15 mL Mouth Rinse BID  . Chlorhexidine Gluconate Cloth  6 each Topical Q0600  . feeding supplement (PRO-STAT SUGAR FREE 64)  30 mL Per Tube BID  . free water  400 mL Per Tube Q3H  . heparin  5,000 Units Subcutaneous Q8H  . insulin aspart  0-9 Units Subcutaneous Q4H  . lacosamide  100 mg Per Tube  BID  . levETIRAcetam  500 mg Oral BID  . mouth rinse  15 mL Mouth Rinse 10 times per day  . mouth rinse  15 mL Mouth Rinse q12n4p  . metoprolol tartrate  12.5 mg Oral BID  . pantoprazole sodium  40 mg Per Tube Daily  . sennosides  10 mL Per Tube Daily  . vancomycin variable dose per unstable renal function (pharmacist dosing)   Does not apply See admin instructions   Continuous Infusions: . feeding supplement (OSMOLITE 1.5 CAL) 55 mL/hr at 04/15/19 0000     LOS: 57 days   Roxan Hockey, MD [facility] If 7PM-7AM, please contact night-coverage [email or web address] Password [facility] 04/15/2019, 6:57 PM

## 2019-04-15 NOTE — Progress Notes (Signed)
  Speech Language Pathology Treatment: Dysphagia  Patient Details Name: REGGINALD PASK MRN: [id number] DOB: Nov 07, 1955 Today's Date: 04/15/2019 Time: 7893-8101 SLP Time Calculation (min) (ACUTE ONLY): 15 min  Assessment / Plan / Recommendation Clinical Impression  Patient was sleeping when entering room, but woke easily when his name called.  Appears pt is a mouth breather and continues to have dried skin on lips and dried secretions in oral cavity. His cough was congested and ineffective to clear pharyngeal secretions to oral cavity. Oral care completed and trials of ice chips given in attempt to assist pt to clear pharyngeal secretions. Ice chips loosened secretions, however still not strong enough to clear pharyngeal secretions. Continued ice chips trials resulted in O2 saturation dropping to mid 80's and increase HR to 110. Ice chip trials discontinued at that time. Recommend pt remain NPO until he is able to effectively clear and protect his airway. At that time an instrumental test would be appropriate.    HPI HPI: NAFTULI DALSANTO is a 63 y.o. male who has a PMH including but not limited to HTN, HLD, HCV, Left CVA (multiple infarcts involving cerebral white matter, thalami, and pons) DM, schizophrenia, dementia and who resides at [facility] in [place]. Admitted to [facility] on 03/18/19 with fever due PNA, required intubated on 8/21 for hypoxic respiratory failure, extubated 8/29 then required re-intubation 8/31 for staus epilepticus then transferred to [facility] 9/2 and self extubated 9/5.       SLP Plan  Continue with current plan of care       Recommendations  Diet recommendations: NPO Medication Administration: Via alternative means                Oral Care Recommendations: Oral care QID SLP Visit Diagnosis: Dysphagia, unspecified (R13.10) Plan: Continue with current plan of care       [place], MA, CCC-SLP 04/15/2019 9:22 AM

## 2019-04-15 NOTE — Progress Notes (Signed)
Report called to Chad Mcclure receiving Nurse. Patient being transferred to room 3W38 By bed. Called Daughter [name] @ [phone]; made aware of transfer. Room number provided. States "On the way to the Hospital" Tynslee Bowlds Cleatis Polka, BSN, RN

## 2019-04-15 NOTE — Progress Notes (Signed)
Pt arrived to unit. Daughter at bedside. Family oriented to unit and staff. Seizure and fall precautions maintained. Kangaroo pump ordered to continue tube feedings.

## 2019-04-15 NOTE — Progress Notes (Signed)
Patient was not on restraints, restraints has been discontinued from previous shift per previous shift Nurse.

## 2019-04-16 DIAGNOSIS — Z9189 Other specified personal risk factors, not elsewhere classified: Secondary | ICD-10-CM

## 2019-04-16 DIAGNOSIS — Z515 Encounter for palliative care: Secondary | ICD-10-CM

## 2019-04-16 DIAGNOSIS — R1310 Dysphagia, unspecified: Secondary | ICD-10-CM

## 2019-04-16 DIAGNOSIS — Z8673 Personal history of transient ischemic attack (TIA), and cerebral infarction without residual deficits: Secondary | ICD-10-CM

## 2019-04-16 LAB — GLUCOSE, CAPILLARY
Glucose-Capillary: 122 mg/dL — ABNORMAL HIGH (ref 70–99)
Glucose-Capillary: 124 mg/dL — ABNORMAL HIGH (ref 70–99)
Glucose-Capillary: 140 mg/dL — ABNORMAL HIGH (ref 70–99)
Glucose-Capillary: 146 mg/dL — ABNORMAL HIGH (ref 70–99)
Glucose-Capillary: 156 mg/dL — ABNORMAL HIGH (ref 70–99)
Glucose-Capillary: 165 mg/dL — ABNORMAL HIGH (ref 70–99)
Glucose-Capillary: 176 mg/dL — ABNORMAL HIGH (ref 70–99)

## 2019-04-16 MED ORDER — LEVETIRACETAM 100 MG/ML PO SOLN
500.0000 mg | Freq: Two times a day (BID) | ORAL | Status: DC
  Administered 2019-04-16 – 2019-04-24 (×16): 500 mg
  Filled 2019-04-16 (×4): qty 5
  Filled 2019-04-16: qty 10
  Filled 2019-04-16: qty 5
  Filled 2019-04-16: qty 10
  Filled 2019-04-16 (×5): qty 5
  Filled 2019-04-16 (×2): qty 10
  Filled 2019-04-16 (×3): qty 5
  Filled 2019-04-16: qty 10
  Filled 2019-04-16: qty 5

## 2019-04-16 MED ORDER — METOPROLOL TARTRATE 12.5 MG HALF TABLET
12.5000 mg | ORAL_TABLET | Freq: Two times a day (BID) | ORAL | Status: DC
  Administered 2019-04-16 – 2019-04-24 (×15): 12.5 mg
  Filled 2019-04-16 (×16): qty 1

## 2019-04-16 NOTE — Plan of Care (Signed)
Patient continues to be confused. Only orientated to self. Patient does not understand his current situation.

## 2019-04-16 NOTE — Progress Notes (Signed)
  Speech Language Pathology Treatment: Dysphagia  Patient Details Name: Chad Mcclure MRN: [id number] DOB: Aug 02, 1955 Today's Date: 04/16/2019 Time: 8295-6213 SLP Time Calculation (min) (ACUTE ONLY): 17 min  Assessment / Plan / Recommendation Clinical Impression  Patient was seen today for dysphagia tx. He was dependent for oral care and cues to open mouth. His oral cavity is dry with what appears to be oral thrush present on tongue. Pt's volitional cough was congested, but too weakened to cough secretions to oral cavity for clearance. A single ice chip was presented with a gurgle response then multiple attempts to swallow. Oral suction used to clear residual from ice chip. Cough was stimulated and judged to be more wet that before. Pt remains at a high risk for aspiration. Recommend pt remain NPO. [place] meeting was held with family. Family is deciding on progressing with a PEG placement or comfort measures. ST to follow up next week.    HPI HPI: Chad Mcclure is a 63 y.o. male who has a PMH including but not limited to HTN, HLD, HCV, Left CVA (multiple infarcts involving cerebral white matter, thalami, and pons) DM, schizophrenia, dementia and who resides at [facility] in [place]. Admitted to [facility] on 03/18/19 with fever due PNA, required intubated on 8/21 for hypoxic respiratory failure, extubated 8/29 then required re-intubation 8/31 for staus epilepticus then transferred to [facility] 9/2 and self extubated 9/5.       SLP Plan  Continue with current plan of care       Recommendations  Diet recommendations: NPO                Follow up Recommendations: Skilled Nursing facility Plan: Continue with current plan of care       [place], MA, CCC-SLP 04/16/2019 12:13 PM

## 2019-04-16 NOTE — Progress Notes (Signed)
Daily Progress Note   Patient Name: Chad Mcclure       Date: 04/16/2019 DOB: [birth year]  Age: 63 y.o. MRN#: [id number] Attending Physician: Barb Merino, MD Primary Care Physician: Patient, No Pcp Per Admit Date: 03/31/2019  Reason for Consultation/Follow-up: Establishing goals of care  Subjective: Visited with patient x2 today. This AM, patient drowsy and following minimal commands for bedside RN.   This afternoon, patient is awake, alert with baseline deficits. Intermittently communicating with son, Chad Mcclure at bedside.   GOC:   Spoke with daughter Chad Mcclure) and son Chad Mcclure) via telephone regarding plan for continued aggressive medical interventions including PEG placement versus comfort focused care.   Discussed course of hospitalization including diagnoses, interventions, plan of care and poor overall prognosis with multiple chronic conditions and overall debility following events this admission. Discussed high risk for continued aspiration, even with feeding tube. Discussed risks with feeding tube placement and this not necessarily impacting quality of life but prolonging his life in this current state. Reviewed notes from PT/OT/SLP.   Children struggle with decision making. Chad Mcclure states "I don't want him to die." Chad Mcclure speaks of her father having poor quality of life. There is one other sibling. Chad Mcclure and [name] share that the patient's sisters believe PEG tube should be placed.   During this conversation, Chad Mcclure shares that he will be at the hospital this afternoon. Offered to meet with him and Chad Mcclure in person tomorrow as well.   Shortly after, spoke with Chad Mcclure at bedside about plan of care and comfort pathway vs. Continued aggressive interventions including PEG tube placement.    Chad Mcclure shares that he will not make the final decision this evening and would like to spend time alone with his father. Encouraged Chad Mcclure to speak with his sister, Chad Mcclure and let care team or I know final decision for PEG placement by tomorrow so we can move forward if necessary. [facility] Choices copy given for review. Family has PMT contact information.  Length of Stay: 16  Current Medications: Scheduled Meds:   amLODipine  10 mg Per Tube Daily   chlorhexidine  15 mL Mouth Rinse BID   chlorhexidine gluconate (MEDLINE KIT)  15 mL Mouth Rinse BID   Chlorhexidine Gluconate Cloth  6 each Topical Q0600   feeding supplement (PRO-STAT SUGAR FREE 64)  30 mL  Per Tube BID   free water  400 mL Per Tube Q3H   heparin  5,000 Units Subcutaneous Q8H   insulin aspart  0-9 Units Subcutaneous Q4H   lacosamide  100 mg Per Tube BID   levETIRAcetam  500 mg Per Tube BID   mouth rinse  15 mL Mouth Rinse 10 times per day   mouth rinse  15 mL Mouth Rinse q12n4p   metoprolol tartrate  12.5 mg Per Tube BID   pantoprazole sodium  40 mg Per Tube Daily   sennosides  10 mL Per Tube Daily   vancomycin variable dose per unstable renal function (pharmacist dosing)   Does not apply See admin instructions    Continuous Infusions:  feeding supplement (OSMOLITE 1.5 CAL) 1,000 mL (04/16/19 0003)    PRN Meds: acetaminophen (TYLENOL) oral liquid 160 mg/5 mL, fentaNYL (SUBLIMAZE) injection, hydrALAZINE, LORazepam, morphine injection, sodium chloride flush  Physical Exam Vitals signs and nursing note reviewed.  Constitutional:      Appearance: He is ill-appearing.  HENT:     Head: Normocephalic and atraumatic.  Pulmonary:     Effort: No tachypnea, accessory muscle usage or respiratory distress.  Skin:    General: Skin is warm and dry.  Neurological:     Mental Status: He is easily aroused.     Comments: Drowsy, opens eyes to voice. Intermittently follows commands.   Psychiatric:         Attention and Perception: He is inattentive.        Cognition and Memory: Cognition is impaired.            Vital Signs: BP 123/79 (BP Location: Left Arm)    Pulse 86    Temp 98.4 F (36.9 C) (Oral)    Resp 16    Ht 6' (1.829 m)    Wt 85 kg    SpO2 94%    BMI 25.41 kg/m  SpO2: SpO2: 94 % O2 Device: O2 Device: Nasal Cannula O2 Flow Rate: O2 Flow Rate (L/min): 4 L/min  Intake/output summary:   Intake/Output Summary (Last 24 hours) at 04/16/2019 1437 Last data filed at 04/16/2019 1103 Gross per 24 hour  Intake 477 ml  Output 2700 ml  Net -2223 ml   LBM: Last BM Date: 04/14/19 Baseline Weight: Weight: 92.5 kg Most recent weight: Weight: 85 kg       Palliative Assessment/Data: PPS 30%      Patient Active Problem List   Diagnosis Date Noted   Aspiration into airway    Palliative care encounter    Encounter for hospice care discussion    Aspiration pneumonia ([place]) 04/01/2019   CVA (cerebral vascular accident) ([place]) 03/31/2019   Acute respiratory failure with hypoxia (HCC)    Status epilepticus (HCC)    AKI (acute kidney injury) ([place])    Encephalopathy    Seizures ([place])     Palliative Care Assessment & Plan   Patient Profile: 63 y.o. male  with past medical history of CVA with residual left hemi paresis in 2012, DM, HCV, dementia, possible schizophrenia who was admitted on 03/31/2019 in transfer from [facility].  He was admitted to [facility] 8/20 with cystitis and pneumonia.  He required intubation.  He was extubated on 8/29 but suffered gross aspiration and status epilepticus and was re-intubated.  He was transferred to [facility].  He had contrast nephropathy and acute renal failure requiring temporary hemodialysis.  On 9/5 he self extubated.  He has been able to discontinue hemodialysis  and is urinating well (creatinine 2.4). His mental status remains altered, he has failed his swallow studies and is having difficulty managing his secretions.  He is currently  receiving nutrition via cortrak.  Assessment: Encephalopathy Status epilepticus Acute hypoxic respiratory failure MRSA pneumonia Seizure disorder AKI suspected secondary to contrast nephropathy Hypernatremia Hx of left CVA Hx of schizophrenia/dementia Debility and deconditioning Aspiration risk  Recommendations/Plan:  Continue FULL code/FULL scope.  Family still contemplating decision regarding PEG tube placement and continued aggressive interventions versus comfort focused care. Spoke with daughter and son this afternoon. 57 of family leaning towards PEG tube placement. Children to further discuss tonight and PMT provider will f/u tomorrow, 9/19.  Code Status: FULL   Code Status Orders  (From admission, onward)         Start     Ordered   03/31/19 2221  Full code  Continuous     03/31/19 2221        Code Status History    This patient has a current code status but no historical code status.   Advance Care Planning Activity    Advance Directive Documentation     Most Recent Value  Type of Advance Directive  Healthcare Power of Attorney [Daughter Monica Hairston]  Pre-existing out of facility DNR order (yellow form or pink MOST form)  --  "MOST" Form in Place?  --       Prognosis:   Poor prognosis  Discharge Planning:  To Be Determined  Care plan was discussed with RN, daughter, son, Dr. Sloan Leiter  Thank you for allowing the Palliative Medicine Team to assist in the care of this patient.   Time In: 0915- 1545- Time Out: 0925 1615 Total Time 40 Prolonged Time Billed no      Greater than 50%  of this time was spent counseling and coordinating care related to the above assessment and plan.  Ihor Dow, DNP, FNP-C Palliative Medicine Team  Phone: [phone] Fax: [phone]  Please contact Palliative Medicine Team phone at [phone] for questions and concerns.

## 2019-04-16 NOTE — Progress Notes (Signed)
PROGRESS NOTE    Chad Mcclure  UXN:[id number] DOB: 1955-12-19 DOA: 03/31/2019 PCP: Patient, No Pcp Per   Brief Narrative:  Patient is a 63 year old male with H/O HTN, HLD, HCV, Left CVA, DM, schizophrenia, dementia and who resides at SNF in [place] , who initially was admitted to [facility] on 8/20 with fevers.  He was suspected to have pneumonia and cystitis.  He required intubation on 8/21 for acute hypoxic respiratory failure.  Extubated on 8/29 did not require intubation on 8/31 for status epilepticus. He required initiation of hemodialysis after failing a lasix challenge, TTS schedule planned (R subclavian perm cath placed). He was transferred to [facility] on 9/2 and was under PCCM service.  He has been extubated now and transferred to hospitalist team on 04/06/2019.  He was managed for MRSA associated pneumonia, seizures.  Hospital course remarkable for persistent encephalopathy .  Nephrology was following for dialysis. Now off dialysis.We were waiting further improvement in the mental status so that he can be fed by mouth.  But unfortunately,he had hospitalization due to persistent encephalopathy, dysphagia. Palliative care consulted  for goals of care discussion.  Reportedly children divided in the opinion.  Assessment & Plan:   Principal Problem:   Acute respiratory failure with hypoxia (HCC) Active Problems:   Status epilepticus ([place])   AKI (acute kidney injury) ([place])   Encephalopathy   Aspiration pneumonia (HCC)   Aspiration into airway   Palliative care encounter   Encounter for hospice care discussion   Acute metabolic encephalopathy in the setting of chronic illnesses and underlying dementia:  Neurology was following.  EEG on 04/01/2019 showed severe encephalopathy but no seizures.  Prolonged encephalopathy most likely secondary to prolonged seizure episodes/status epilepticus.  Mostly remains encephalopathic.  Follows simple commands.   Acute hypoxic respiratory  failure:Treated for MRSA pneumonia.  Had to be intubated twice.  Self extubated on 9/5.  Currently respiratory status stable on nasal cannula.  On 4 L of oxygen per minute. Continue pulmonary toileting as able.  MRSA pneumonia: Completed  the course of vancomycin.  Currently respiratory status is stable.  High risk of aspiration.  Seizure disorder: On Vimpat, Keppra.  Neurology was following.  Currently remains on antiseizure medication through the tube.  AKI suspected secondary to contrast nephropathy: Nephrology following.  Started on dialysis on 8/24 at [facility]. He is having good urine output.  Expecting full renal recovery.  Nephrology was following.  He had permanent cath on the right chest which has been removed. We will recheck renal functions tomorrow morning.  Hypernatremia: Improved.  Recheck to ensure stabilization.    Persistent Sinus tachycardia: Started on lowe dose metoprolol with response.  Stable  Hypertension: Continue blood pressure medicines.  Continue to monitor blood pressure.  Stable  Dysphagia/protein calorie malnutrition: Dietitian following.  Currently on tube feeding. Speech therapy continues to recommend NPO.patient will need a PEG tube if desired to continue artificial feeding.  Palliative care meeting with family.  Diabetes mellitus: Continue sliding scale insulin.  Stable.  History of schizophrenia/dementia: Continue supportive care.  History of left CVA: Has residual weakness on the left side.  Skilled nursing facility resident.He had slurred speech on baseline.  Debility/deconditioning: PT/OT consulted and recommended skilled nursing facility.  Social worker made aware.  When he is medically ready for discharge, he will be discharged back to skilled nursing facility at [facility].  Nutrition Problem: Inadequate oral intake Etiology: inability to eat  DVT prophylaxis: Heparin [place] Code Status: Full Family Communication:  None.  Handled by palliative care.  Disposition Plan: Back to skilled nursing facility at [facility] when medically stable.Still on tube feeding. --Awaiting family decision on PEG tube Versus transitioning to hospice   Consultants: PCCM, neurology, nephrology  Procedures: Intubation, extubation, dialysis catheter placement  Antimicrobials:  Anti-infectives (From admission, onward)   Start     Dose/Rate Route Frequency Ordered Stop   04/07/19 1000  vancomycin (VANCOCIN) 500 mg in sodium chloride 0.9 % 100 mL IVPB     500 mg 100 mL/hr over 60 Minutes Intravenous  Once 04/07/19 0835 04/07/19 1026   04/04/19 0830  vancomycin (VANCOCIN) 1,500 mg in sodium chloride 0.9 % 500 mL IVPB     1,500 mg 250 mL/hr over 120 Minutes Intravenous  Once 04/04/19 0810 04/04/19 1127   04/03/19 1200  vancomycin (VANCOCIN) IVPB 1000 mg/200 mL premix  Status:  Discontinued     1,000 mg 200 mL/hr over 60 Minutes Intravenous Every T-Th-Sa (Hemodialysis) 04/01/19 1253 04/03/19 0744   04/03/19 0746  vancomycin variable dose per unstable renal function (pharmacist dosing)      Does not apply See admin instructions 04/03/19 0746     04/01/19 1400  vancomycin (VANCOCIN) 2,000 mg in sodium chloride 0.9 % 500 mL IVPB     2,000 mg 250 mL/hr over 120 Minutes Intravenous  Once 04/01/19 1253 04/01/19 1900   04/01/19 0800  meropenem (MERREM) 500 mg in sodium chloride 0.9 % 100 mL IVPB  Status:  Discontinued     500 mg 200 mL/hr over 30 Minutes Intravenous Every 24 hours 04/01/19 0059 04/03/19 0757   03/31/19 2315  piperacillin-tazobactam (ZOSYN) IVPB 3.375 g     3.375 g 100 mL/hr over 30 Minutes Intravenous  Once 03/31/19 2300 04/01/19 0031      Subjective:  No overnight events.  Remains on tube feeding. Objective: Vitals:   04/16/19 0500 04/16/19 0745 04/16/19 1008 04/16/19 1111  BP:  130/79 (!) 132/100 123/79  Pulse:  91  86  Resp:    16  Temp:  97.6 F (36.4 C)  98.4 F (36.9 C)  TempSrc:  Oral  Oral  SpO2:  95%  94%  Weight: 85 kg      Height:        Intake/Output Summary (Last 24 hours) at 04/16/2019 1343 Last data filed at 04/16/2019 1103 Gross per 24 hour  Intake 477 ml  Output 2700 ml  Net -2223 ml   Filed Weights   04/14/19 0600 04/15/19 0500 04/16/19 0500  Weight: 82.3 kg 80 kg 85 kg    Examination:  General exam: Sick looking, confused, on 4 L oxygen. HEENT:PERRL,Oral mucosa moist,   Respiratory system: Bilateral decreased air entry in the bases, no wheezes or crackles, conducted airway sounds. Cardiovascular system: S1 & S2 heard, RRR. No JVD, murmurs, rubs, gallops or clicks. Gastrointestinal system: Abdomen is nondistended, soft and nontender. No organomegaly or masses felt. Normal bowel sounds heard. Central nervous system: Sleepy , confused, left residual hemiparesis with contractures.  Follows very simple commands and opens eyes and tries to speak when called name. extremities:  , no clubbing ,no cyanosis, distal peripheral pulses palpable. Skin: No rashes, lesions or ulcers,no icterus ,no pallor  Data Reviewed:   CBC: Recent Labs  Lab 04/13/19 0325  WBC 9.4  NEUTROABS 5.4  HGB 12.1*  HCT 36.4*  MCV 85.0  PLT 007   Basic Metabolic Panel: Recent Labs  Lab 04/10/19 0618 04/11/19 6226 04/12/19 3335 04/13/19 0325 04/14/19 0442  NA 147* 146* 152* 153* 143  K 3.6 3.8 4.4 4.1 3.8  CL 116* 117* 121* 120* 113*  CO2 21* 22 21* 21* 22  GLUCOSE 146* 167* 184* 162* 171*  BUN 41* 38* 42* 43* 33*  CREATININE 2.68* 2.38* 2.49* 2.44* 2.24*  CALCIUM 8.7* 8.6* 8.9 8.7* 8.2*   GFR: Estimated Creatinine Clearance: 37 mL/min (A) (by C-G formula based on SCr of 2.24 mg/dL (H)). Liver Function Tests: No results for input(s): AST, ALT, ALKPHOS, BILITOT, PROT, ALBUMIN in the last 168 hours. No results for input(s): LIPASE, AMYLASE in the last 168 hours. No results for input(s): AMMONIA in the last 168 hours. Coagulation Profile: No results for input(s): INR, PROTIME in the last 168 hours. Cardiac  Enzymes: No results for input(s): CKTOTAL, CKMB, CKMBINDEX, TROPONINI in the last 168 hours. BNP (last 3 results) No results for input(s): PROBNP in the last 8760 hours. HbA1C: No results for input(s): HGBA1C in the last 72 hours. CBG: Recent Labs  Lab 04/15/19 2005 04/16/19 0003 04/16/19 0409 04/16/19 0750 04/16/19 1112  GLUCAP 149* 124* 165* 140* 122*   Lipid Profile: No results for input(s): CHOL, HDL, LDLCALC, TRIG, CHOLHDL, LDLDIRECT in the last 72 hours. Thyroid Function Tests: No results for input(s): TSH, T4TOTAL, FREET4, T3FREE, THYROIDAB in the last 72 hours. Anemia Panel: No results for input(s): VITAMINB12, FOLATE, FERRITIN, TIBC, IRON, RETICCTPCT in the last 72 hours. Sepsis Labs: No results for input(s): PROCALCITON, LATICACIDVEN in the last 168 hours.  No results found for this or any previous visit (from the past 240 hour(s)).   Radiology Studies: No results found.  Scheduled Meds: . amLODipine  10 mg Per Tube Daily  . chlorhexidine  15 mL Mouth Rinse BID  . chlorhexidine gluconate (MEDLINE KIT)  15 mL Mouth Rinse BID  . Chlorhexidine Gluconate Cloth  6 each Topical Q0600  . feeding supplement (PRO-STAT SUGAR FREE 64)  30 mL Per Tube BID  . free water  400 mL Per Tube Q3H  . heparin  5,000 Units Subcutaneous Q8H  . insulin aspart  0-9 Units Subcutaneous Q4H  . lacosamide  100 mg Per Tube BID  . levETIRAcetam  500 mg Per Tube BID  . mouth rinse  15 mL Mouth Rinse 10 times per day  . mouth rinse  15 mL Mouth Rinse q12n4p  . metoprolol tartrate  12.5 mg Per Tube BID  . pantoprazole sodium  40 mg Per Tube Daily  . sennosides  10 mL Per Tube Daily  . vancomycin variable dose per unstable renal function (pharmacist dosing)   Does not apply See admin instructions   Continuous Infusions: . feeding supplement (OSMOLITE 1.5 CAL) 1,000 mL (04/16/19 0003)  Total time spent: 25 minutes.   LOS: 16 days   Barb Merino, MD [facility] If 7PM-7AM, please  contact night-coverage [email or web address] Password [facility] 04/16/2019, 1:43 PM

## 2019-04-17 LAB — CBC WITH DIFFERENTIAL/PLATELET
Abs Immature Granulocytes: 0.07 10*3/uL (ref 0.00–0.07)
Basophils Absolute: 0 10*3/uL (ref 0.0–0.1)
Basophils Relative: 0 %
Eosinophils Absolute: 0.1 10*3/uL (ref 0.0–0.5)
Eosinophils Relative: 1 %
HCT: 34.5 % — ABNORMAL LOW (ref 39.0–52.0)
Hemoglobin: 11.1 g/dL — ABNORMAL LOW (ref 13.0–17.0)
Immature Granulocytes: 1 %
Lymphocytes Relative: 27 %
Lymphs Abs: 2.6 10*3/uL (ref 0.7–4.0)
MCH: 27.1 pg (ref 26.0–34.0)
MCHC: 32.2 g/dL (ref 30.0–36.0)
MCV: 84.4 fL (ref 80.0–100.0)
Monocytes Absolute: 1 10*3/uL (ref 0.1–1.0)
Monocytes Relative: 10 %
Neutro Abs: 5.8 10*3/uL (ref 1.7–7.7)
Neutrophils Relative %: 61 %
Platelets: 262 10*3/uL (ref 150–400)
RBC: 4.09 MIL/uL — ABNORMAL LOW (ref 4.22–5.81)
RDW: 16.6 % — ABNORMAL HIGH (ref 11.5–15.5)
WBC: 9.6 10*3/uL (ref 4.0–10.5)
nRBC: 0.2 % (ref 0.0–0.2)

## 2019-04-17 LAB — GLUCOSE, CAPILLARY
Glucose-Capillary: 156 mg/dL — ABNORMAL HIGH (ref 70–99)
Glucose-Capillary: 109 mg/dL — ABNORMAL HIGH (ref 70–99)
Glucose-Capillary: 128 mg/dL — ABNORMAL HIGH (ref 70–99)
Glucose-Capillary: 160 mg/dL — ABNORMAL HIGH (ref 70–99)
Glucose-Capillary: 143 mg/dL — ABNORMAL HIGH (ref 70–99)
Glucose-Capillary: 131 mg/dL — ABNORMAL HIGH (ref 70–99)

## 2019-04-17 LAB — BASIC METABOLIC PANEL
Anion gap: 13 (ref 5–15)
BUN: 27 mg/dL — ABNORMAL HIGH (ref 8–23)
CO2: 21 mmol/L — ABNORMAL LOW (ref 22–32)
Calcium: 8.4 mg/dL — ABNORMAL LOW (ref 8.9–10.3)
Chloride: 106 mmol/L (ref 98–111)
Creatinine, Ser: 1.87 mg/dL — ABNORMAL HIGH (ref 0.61–1.24)
GFR calc Af Amer: 43 mL/min — ABNORMAL LOW (ref >60)
GFR calc non Af Amer: 37 mL/min — ABNORMAL LOW (ref >60)
Glucose, Bld: 152 mg/dL — ABNORMAL HIGH (ref 70–99)
Potassium: 4 mmol/L (ref 3.5–5.1)
Sodium: 140 mmol/L (ref 135–145)

## 2019-04-17 NOTE — Progress Notes (Signed)
PMT phone received call from family. They would like to pursue PEG tube placement and continue with ongoing aggressive medical interventions. Notified attending of family wishes. Dr. Sloan Leiter to discuss with GI.   NO [place], [place], [place] Palliative Medicine Team  Phone: [phone] Fax: [phone]

## 2019-04-17 NOTE — Progress Notes (Signed)
Patient does not have [place] test documented since arrival at [facility]. MD notified, said he was last tested 8/20 in [place] and is not concerned.

## 2019-04-17 NOTE — Progress Notes (Signed)
PROGRESS NOTE    Chad Mcclure  WGY:[id number] DOB: [birth year] DOA: 03/31/2019 PCP: Patient, No Pcp Per   Brief Narrative:  Patient is a 63 year old male with H/O HTN, HLD, HCV, Left CVA, DM, schizophrenia, dementia and who resides at SNF in [place] , who initially was admitted to [facility] on 8/20 with fevers.  He was suspected to have pneumonia and cystitis.  He required intubation on 8/21 for acute hypoxic respiratory failure.  Extubated on 8/29 and  did require intubation on 8/31 for status epilepticus. He required initiation of hemodialysis after failing a lasix challenge, TTS schedule planned (R subclavian perm cath placed). He was transferred to [facility] on 9/2 and was under PCCM service.  He has been extubated now and transferred to hospitalist team on 04/06/2019.  He was managed for MRSA associated pneumonia, seizures.  Hospital course remarkable for persistent encephalopathy .  Nephrology was following for dialysis. Now off dialysis.We were waiting further improvement in the mental status so that he can be fed by mouth.   But unfortunately,he has not been able to eat due to persistent encephalopathy. Palliative care consulted  for goals of care discussion. Family decided to pursue Peg tube placement now.   Assessment & Plan:   Principal Problem:   Acute respiratory failure with hypoxia (HCC) Active Problems:   Status epilepticus ([place])   AKI (acute kidney injury) ([place])   Encephalopathy   Aspiration pneumonia (HCC)   Aspiration into airway   Palliative care encounter   Goals of care, counseling/discussion   Palliative care by specialist   Dysphagia   At high risk for aspiration   History of stroke   Acute metabolic encephalopathy in the setting of chronic illnesses and underlying dementia:  Neurology was following.  EEG on 04/01/2019 showed severe encephalopathy but no seizures.  Prolonged encephalopathy most likely secondary to prolonged seizure episodes/status  epilepticus.  Mostly remains encephalopathic.  Follows simple commands.   Acute hypoxic respiratory failure:Treated for MRSA pneumonia.  Had to be intubated twice.  Self extubated on 9/5.  Currently respiratory status stable on nasal cannula.  On 3-4 L of oxygen per minute. Continue pulmonary toileting as able.  MRSA pneumonia: Completed  the course of vancomycin.  Currently respiratory status is stable.  High risk of aspiration.  Seizure disorder: On Vimpat, Keppra.  Neurology was following.  Currently remains on antiseizure medication through the tube.  AKI suspected secondary to contrast nephropathy: Nephrology following.  Started on dialysis on 8/24 at [facility]. He is having good urine output.  Expecting full renal recovery.  Nephrology was following.  He had permanent cath on the right chest which has been removed. Renal functions stabilizing.   Hypernatremia: Improved.     Persistent Sinus tachycardia: Started on lowe dose metoprolol with response.  Stable.  Hypertension: Continue blood pressure medicines.  Continue to monitor blood pressure.  Stable.  Dysphagia/protein calorie malnutrition: Dietitian following.  Currently on tube feeding. Speech therapy continues to recommend NPO.  Patient will need PEG tube, palliative care was following and we are waiting for patient's children to decide about hospice versus PEG tube and nursing home placement. They decided to go for PEG tube feeding.  Will consult GI.  Diabetes mellitus: Continue sliding scale insulin.  Stable.  History of schizophrenia/dementia: Continue supportive care.  History of left CVA: Has residual weakness on the left side.  Skilled nursing facility resident.He had slurred speech on baseline.  Debility/deconditioning: PT/OT consulted and recommended skilled nursing facility.  Social worker made aware.  When he is medically ready for discharge, he will be discharged back to skilled nursing facility at [facility].   Nutrition Problem: Inadequate oral intake Etiology: inability to eat  DVT prophylaxis: Heparin [place] Code Status: Full Family Communication: None.  By palliative care. Disposition Plan: Back to skilled nursing facility at [facility] when medically stable.Still on tube feeding. After PEG tube placement.  Consultants: PCCM, neurology, nephrology  Procedures: Intubation, extubation, dialysis catheter placement  Antimicrobials:  Anti-infectives (From admission, onward)   Start     Dose/Rate Route Frequency Ordered Stop   04/07/19 1000  vancomycin (VANCOCIN) 500 mg in sodium chloride 0.9 % 100 mL IVPB     500 mg 100 mL/hr over 60 Minutes Intravenous  Once 04/07/19 0835 04/07/19 1026   04/04/19 0830  vancomycin (VANCOCIN) 1,500 mg in sodium chloride 0.9 % 500 mL IVPB     1,500 mg 250 mL/hr over 120 Minutes Intravenous  Once 04/04/19 0810 04/04/19 1127   04/03/19 1200  vancomycin (VANCOCIN) IVPB 1000 mg/200 mL premix  Status:  Discontinued     1,000 mg 200 mL/hr over 60 Minutes Intravenous Every T-Th-Sa (Hemodialysis) 04/01/19 1253 04/03/19 0744   04/03/19 0746  vancomycin variable dose per unstable renal function (pharmacist dosing)      Does not apply See admin instructions 04/03/19 0746     04/01/19 1400  vancomycin (VANCOCIN) 2,000 mg in sodium chloride 0.9 % 500 mL IVPB     2,000 mg 250 mL/hr over 120 Minutes Intravenous  Once 04/01/19 1253 04/01/19 1900   04/01/19 0800  meropenem (MERREM) 500 mg in sodium chloride 0.9 % 100 mL IVPB  Status:  Discontinued     500 mg 200 mL/hr over 30 Minutes Intravenous Every 24 hours 04/01/19 0059 04/03/19 0757   03/31/19 2315  piperacillin-tazobactam (ZOSYN) IVPB 3.375 g     3.375 g 100 mL/hr over 30 Minutes Intravenous  Once 03/31/19 2300 04/01/19 0031      Subjective:  No overnight events.  Remains on tube feeding.  Unable to provide any meaningful history. Objective: Vitals:   04/16/19 2340 04/17/19 0354 04/17/19 0800 04/17/19 1114  BP:  121/71 134/83 134/74 127/71  Pulse: 98 93 95 89  Resp: _0 Temp: 98.9 F (37.2 C) 98.1 F (36.7 C) 98.1 F (36.7 C) 97.7 F (36.5 C)  TempSrc: Oral Oral Oral Axillary  SpO2: 97% 93% 93% 92%  Weight:      Height:        Intake/Output Summary (Last 24 hours) at 04/17/2019 1455 Last data filed at 04/17/2019 0803 Gross per 24 hour  Intake -  Output 2200 ml  Net -2200 ml   Filed Weights   04/14/19 0600 04/15/19 0500 04/16/19 0500  Weight: 82.3 kg 80 kg 85 kg    Examination:  General exam: Sick looking, confused, on 3-4 L oxygen. HEENT:PERRL,Oral mucosa moist,   Respiratory system: Bilateral conducted airway sounds.  no wheezes or crackles, conducted airway sounds. Cardiovascular system: S1 & S2 heard, RRR. No JVD, murmurs, rubs, gallops or clicks. Gastrointestinal system: Abdomen is nondistended, soft and nontender. No organomegaly or masses felt. Normal bowel sounds heard. Central nervous system: Sleepy , confused, left residual hemiparesis with contractures.  Follows very simple commands and opens eyes and tries to speak when called name. Extremities:  , no clubbing ,no cyanosis, distal peripheral pulses palpable. Skin: No rashes, lesions or ulcers,no icterus ,no pallor  Data Reviewed:   CBC: Recent  Labs  Lab 04/13/19 0325 04/17/19 0346  WBC 9.4 9.6  NEUTROABS 5.4 5.8  HGB 12.1* 11.1*  HCT 36.4* 34.5*  MCV 85.0 84.4  PLT 249 253   Basic Metabolic Panel: Recent Labs  Lab 04/11/19 0838 04/12/19 0823 04/13/19 0325 04/14/19 0442 04/17/19 0346  NA 146* 152* 153* 143 140  K 3.8 4.4 4.1 3.8 4.0  CL 117* 121* 120* 113* 106  CO2 22 21* 21* 22 21*  GLUCOSE 167* 184* 162* 171* 152*  BUN 38* 42* 43* 33* 27*  CREATININE 2.38* 2.49* 2.44* 2.24* 1.87*  CALCIUM 8.6* 8.9 8.7* 8.2* 8.4*   GFR: Estimated Creatinine Clearance: 44.4 mL/min (A) (by C-G formula based on SCr of 1.87 mg/dL (H)). Liver Function Tests: No results for input(s): AST, ALT, ALKPHOS, BILITOT,  PROT, ALBUMIN in the last 168 hours. No results for input(s): LIPASE, AMYLASE in the last 168 hours. No results for input(s): AMMONIA in the last 168 hours. Coagulation Profile: No results for input(s): INR, PROTIME in the last 168 hours. Cardiac Enzymes: No results for input(s): CKTOTAL, CKMB, CKMBINDEX, TROPONINI in the last 168 hours. BNP (last 3 results) No results for input(s): PROBNP in the last 8760 hours. HbA1C: No results for input(s): HGBA1C in the last 72 hours. CBG: Recent Labs  Lab 04/16/19 2003 04/16/19 2336 04/17/19 0352 04/17/19 0756 04/17/19 1149  GLUCAP 146* 176* 156* 109* 128*   Lipid Profile: No results for input(s): CHOL, HDL, LDLCALC, TRIG, CHOLHDL, LDLDIRECT in the last 72 hours. Thyroid Function Tests: No results for input(s): TSH, T4TOTAL, FREET4, T3FREE, THYROIDAB in the last 72 hours. Anemia Panel: No results for input(s): VITAMINB12, FOLATE, FERRITIN, TIBC, IRON, RETICCTPCT in the last 72 hours. Sepsis Labs: No results for input(s): PROCALCITON, LATICACIDVEN in the last 168 hours.  No results found for this or any previous visit (from the past 240 hour(s)).   Radiology Studies: No results found.  Scheduled Meds: . amLODipine  10 mg Per Tube Daily  . chlorhexidine  15 mL Mouth Rinse BID  . chlorhexidine gluconate (MEDLINE KIT)  15 mL Mouth Rinse BID  . Chlorhexidine Gluconate Cloth  6 each Topical Q0600  . feeding supplement (PRO-STAT SUGAR FREE 64)  30 mL Per Tube BID  . free water  400 mL Per Tube Q3H  . heparin  5,000 Units Subcutaneous Q8H  . insulin aspart  0-9 Units Subcutaneous Q4H  . lacosamide  100 mg Per Tube BID  . levETIRAcetam  500 mg Per Tube BID  . mouth rinse  15 mL Mouth Rinse 10 times per day  . mouth rinse  15 mL Mouth Rinse q12n4p  . metoprolol tartrate  12.5 mg Per Tube BID  . pantoprazole sodium  40 mg Per Tube Daily  . sennosides  10 mL Per Tube Daily  . vancomycin variable dose per unstable renal function  (pharmacist dosing)   Does not apply See admin instructions   Continuous Infusions: . feeding supplement (OSMOLITE 1.5 CAL) 1,000 mL (04/16/19 2156)  Total time spent: 25 minutes.   LOS: 17 days   Barb Merino, MD [facility] If 7PM-7AM, please contact night-coverage [email or web address] Password [facility] 04/17/2019, 2:55 PM

## 2019-04-18 ENCOUNTER — Encounter (HOSPITAL_COMMUNITY): Payer: Self-pay | Admitting: Physician Assistant

## 2019-04-18 LAB — BASIC METABOLIC PANEL
Anion gap: 8 (ref 5–15)
BUN: 25 mg/dL — ABNORMAL HIGH (ref 8–23)
CO2: 21 mmol/L — ABNORMAL LOW (ref 22–32)
Calcium: 8.1 mg/dL — ABNORMAL LOW (ref 8.9–10.3)
Chloride: 106 mmol/L (ref 98–111)
Creatinine, Ser: 1.7 mg/dL — ABNORMAL HIGH (ref 0.61–1.24)
GFR calc Af Amer: 49 mL/min — ABNORMAL LOW (ref >60)
GFR calc non Af Amer: 42 mL/min — ABNORMAL LOW (ref >60)
Glucose, Bld: 131 mg/dL — ABNORMAL HIGH (ref 70–99)
Potassium: 3.9 mmol/L (ref 3.5–5.1)
Sodium: 135 mmol/L (ref 135–145)

## 2019-04-18 LAB — GLUCOSE, CAPILLARY
Glucose-Capillary: 139 mg/dL — ABNORMAL HIGH (ref 70–99)
Glucose-Capillary: 136 mg/dL — ABNORMAL HIGH (ref 70–99)
Glucose-Capillary: 120 mg/dL — ABNORMAL HIGH (ref 70–99)
Glucose-Capillary: 116 mg/dL — ABNORMAL HIGH (ref 70–99)
Glucose-Capillary: 145 mg/dL — ABNORMAL HIGH (ref 70–99)

## 2019-04-18 MED ORDER — HEPARIN SODIUM (PORCINE) 5000 UNIT/ML IJ SOLN
5000.0000 [IU] | Freq: Three times a day (TID) | INTRAMUSCULAR | Status: AC
  Administered 2019-04-18 (×2): 5000 [IU] via SUBCUTANEOUS
  Filled 2019-04-18 (×2): qty 1
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
## 2019-04-18 NOTE — Progress Notes (Signed)
PROGRESS NOTE    Chad Mcclure  ZSW:[id number] DOB: [birth year] DOA: 03/31/2019 PCP: Patient, No Pcp Per   Brief Narrative:  Patient is a 63 year old male with H/O HTN, HLD, HCV, Left CVA, DM, schizophrenia, dementia and who resides at SNF in [place] , who initially was admitted to [facility] on 8/20 with fevers.  He was suspected to have pneumonia and cystitis.  He required intubation on 8/21 for acute hypoxic respiratory failure.  Extubated on 8/29 and  did require intubation on 8/31 for status epilepticus. He required initiation of hemodialysis after failing a lasix challenge, TTS schedule planned (R subclavian perm cath placed). He was transferred to [facility] on 9/2 and was under PCCM service.  He has been extubated now and transferred to hospitalist team on 04/06/2019.  He was managed for MRSA associated pneumonia, seizures.  Hospital course remarkable for persistent encephalopathy .  Nephrology was following for dialysis. Now off dialysis.We were waiting further improvement in the mental status so that he can be fed by mouth.   But unfortunately,he has not been able to eat due to persistent encephalopathy. Palliative care consulted  for goals of care discussion. Family decided to pursue Peg tube placement now.   Assessment & Plan:   Principal Problem:   Acute respiratory failure with hypoxia (HCC) Active Problems:   Status epilepticus ([place])   AKI (acute kidney injury) ([place])   Encephalopathy   Aspiration pneumonia (HCC)   Aspiration into airway   Palliative care encounter   Goals of care, counseling/discussion   Palliative care by specialist   Dysphagia   At high risk for aspiration   History of stroke   Acute metabolic encephalopathy in the setting of chronic illnesses and underlying dementia:  Neurology was following.  EEG on 04/01/2019 showed severe encephalopathy but no seizures.  Prolonged encephalopathy most likely secondary to prolonged seizure episodes/status  epilepticus.  Mostly remains encephalopathic.  Follows simple commands.   Acute hypoxic respiratory failure:Treated for MRSA pneumonia.  Had to be intubated twice.  Self extubated on 9/5.  Currently respiratory status stable on nasal cannula.  On 3-4 L of oxygen per minute. Continue pulmonary toileting as able.  MRSA pneumonia: Completed  the course of vancomycin.  Currently respiratory status is stable.  High risk of aspiration.  Seizure disorder: On Vimpat, Keppra.  Neurology was following.  Currently remains on antiseizure medication through the tube.  AKI suspected secondary to contrast nephropathy: Nephrology following.  Started on dialysis on 8/24 at [facility]. He is having good urine output.  Expecting full renal recovery.  Nephrology was following.  He had permanent cath on the right chest which has been removed. Renal functions stabilizing.   Hypernatremia: Improved.     Persistent Sinus tachycardia: Started on lowe dose metoprolol with response.  Stable.  Hypertension: Continue blood pressure medicines.  Continue to monitor blood pressure.  Stable.  Dysphagia/protein calorie malnutrition: Dietitian following.  Currently on tube feeding. Speech therapy continues to recommend NPO.  Family decided to pursue with PEG tube placement. Interventional radiology consulted and for tentative percutaneous tube on 9/21.  Diabetes mellitus: Continue sliding scale insulin.  Stable.  History of schizophrenia/dementia: Continue supportive care.  History of left CVA: Has residual weakness on the left side.  Skilled nursing facility resident.He had slurred speech on baseline.  Debility/deconditioning: PT/OT consulted and recommended skilled nursing facility.  Social worker made aware.  When he is medically ready for discharge, he will be discharged back to skilled nursing facility  at [facility].  Nutrition Problem: Inadequate oral intake Etiology: inability to eat  DVT prophylaxis: Heparin   Code Status: Full Family Communication: None.  Radiology had taken consent from family. Disposition Plan: Back to skilled nursing facility at [facility] when medically stable.Still on tube feeding. After PEG tube placement.  Consultants: PCCM, neurology, nephrology  Procedures: Intubation, extubation, dialysis catheter placement  Antimicrobials:  Anti-infectives (From admission, onward)   Start     Dose/Rate Route Frequency Ordered Stop   04/07/19 1000  vancomycin (VANCOCIN) 500 mg in sodium chloride 0.9 % 100 mL IVPB     500 mg 100 mL/hr over 60 Minutes Intravenous  Once 04/07/19 0835 04/07/19 1026   04/04/19 0830  vancomycin (VANCOCIN) 1,500 mg in sodium chloride 0.9 % 500 mL IVPB     1,500 mg 250 mL/hr over 120 Minutes Intravenous  Once 04/04/19 0810 04/04/19 1127   04/03/19 1200  vancomycin (VANCOCIN) IVPB 1000 mg/200 mL premix  Status:  Discontinued     1,000 mg 200 mL/hr over 60 Minutes Intravenous Every T-Th-Sa (Hemodialysis) 04/01/19 1253 04/03/19 0744   04/03/19 0746  vancomycin variable dose per unstable renal function (pharmacist dosing)      Does not apply See admin instructions 04/03/19 0746     04/01/19 1400  vancomycin (VANCOCIN) 2,000 mg in sodium chloride 0.9 % 500 mL IVPB     2,000 mg 250 mL/hr over 120 Minutes Intravenous  Once 04/01/19 1253 04/01/19 1900   04/01/19 0800  meropenem (MERREM) 500 mg in sodium chloride 0.9 % 100 mL IVPB  Status:  Discontinued     500 mg 200 mL/hr over 30 Minutes Intravenous Every 24 hours 04/01/19 0059 04/03/19 0757   03/31/19 2315  piperacillin-tazobactam (ZOSYN) IVPB 3.375 g     3.375 g 100 mL/hr over 30 Minutes Intravenous  Once 03/31/19 2300 04/01/19 0031      Subjective: Patient seen and examined.  No overnight events.  No interaction.  Remains on tube feeding.  On 3 to 4 L of oxygen.  Objective: Vitals:   04/18/19 0413 04/18/19 0429 04/18/19 0916 04/18/19 1231  BP: 129/83  (!) 142/89 126/79  Pulse: (!) 101  (!) 101 88   Resp: 17  20 20   Temp: 98.3 F (36.8 C)  98.6 F (37 C) 98.1 F (36.7 C)  TempSrc: Oral  Oral Oral  SpO2:   93% (!) 84%  Weight:  85.6 kg    Height:        Intake/Output Summary (Last 24 hours) at 04/18/2019 1334 Last data filed at 04/18/2019 0615 Gross per 24 hour  Intake 11619 ml  Output 1450 ml  Net 10169 ml   Filed Weights   04/15/19 0500 04/16/19 0500 04/18/19 0429  Weight: 80 kg 85 kg 85.6 kg    Examination:  General exam: Sick looking, confused, mostly sleepy, on 3-4 L oxygen. HEENT:PERRL,Oral mucosa moist,   Respiratory system: no wheezes or crackles, conducted airway sounds. Cardiovascular system: S1 & S2 heard, RRR. No JVD, murmurs, rubs, gallops or clicks. Gastrointestinal system: Abdomen is nondistended, soft and nontender. No organomegaly or masses felt. Normal bowel sounds heard. Central nervous system: Sleepy , confused, left residual hemiparesis with contractures.  Follows very simple commands and opens eyes and tries to speak when called name. Extremities:  , no clubbing ,no cyanosis, distal peripheral pulses palpable. Skin: No rashes, lesions or ulcers,no icterus ,no pallor  Data Reviewed:   CBC: Recent Labs  Lab 04/13/19 0325 04/17/19 0346  WBC  9.4 9.6  NEUTROABS 5.4 5.8  HGB 12.1* 11.1*  HCT 36.4* 34.5*  MCV 85.0 84.4  PLT 249 270   Basic Metabolic Panel: Recent Labs  Lab 04/12/19 0823 04/13/19 0325 04/14/19 0442 04/17/19 0346 04/18/19 0821  NA 152* 153* 143 140 135  K 4.4 4.1 3.8 4.0 3.9  CL 121* 120* 113* 106 106  CO2 21* 21* 22 21* 21*  GLUCOSE 184* 162* 171* 152* 131*  BUN 42* 43* 33* 27* 25*  CREATININE 2.49* 2.44* 2.24* 1.87* 1.70*  CALCIUM 8.9 8.7* 8.2* 8.4* 8.1*   GFR: Estimated Creatinine Clearance: 48.8 mL/min (A) (by C-G formula based on SCr of 1.7 mg/dL (H)). Liver Function Tests: No results for input(s): AST, ALT, ALKPHOS, BILITOT, PROT, ALBUMIN in the last 168 hours. No results for input(s): LIPASE, AMYLASE in the  last 168 hours. No results for input(s): AMMONIA in the last 168 hours. Coagulation Profile: No results for input(s): INR, PROTIME in the last 168 hours. Cardiac Enzymes: No results for input(s): CKTOTAL, CKMB, CKMBINDEX, TROPONINI in the last 168 hours. BNP (last 3 results) No results for input(s): PROBNP in the last 8760 hours. HbA1C: No results for input(s): HGBA1C in the last 72 hours. CBG: Recent Labs  Lab 04/17/19 1933 04/17/19 2318 04/18/19 0415 04/18/19 0919 04/18/19 1234  GLUCAP 143* 131* 139* 136* 120*   Lipid Profile: No results for input(s): CHOL, HDL, LDLCALC, TRIG, CHOLHDL, LDLDIRECT in the last 72 hours. Thyroid Function Tests: No results for input(s): TSH, T4TOTAL, FREET4, T3FREE, THYROIDAB in the last 72 hours. Anemia Panel: No results for input(s): VITAMINB12, FOLATE, FERRITIN, TIBC, IRON, RETICCTPCT in the last 72 hours. Sepsis Labs: No results for input(s): PROCALCITON, LATICACIDVEN in the last 168 hours.  No results found for this or any previous visit (from the past 240 hour(s)).   Radiology Studies: No results found.  Scheduled Meds: . amLODipine  10 mg Per Tube Daily  . chlorhexidine  15 mL Mouth Rinse BID  . chlorhexidine gluconate (MEDLINE KIT)  15 mL Mouth Rinse BID  . Chlorhexidine Gluconate Cloth  6 each Topical Q0600  . feeding supplement (PRO-STAT SUGAR FREE 64)  30 mL Per Tube BID  . free water  400 mL Per Tube Q3H  . heparin  5,000 Units Subcutaneous Q8H  . insulin aspart  0-9 Units Subcutaneous Q4H  . lacosamide  100 mg Per Tube BID  . levETIRAcetam  500 mg Per Tube BID  . mouth rinse  15 mL Mouth Rinse 10 times per day  . mouth rinse  15 mL Mouth Rinse q12n4p  . metoprolol tartrate  12.5 mg Per Tube BID  . pantoprazole sodium  40 mg Per Tube Daily  . sennosides  10 mL Per Tube Daily  . vancomycin variable dose per unstable renal function (pharmacist dosing)   Does not apply See admin instructions   Continuous Infusions: . feeding  supplement (OSMOLITE 1.5 CAL) 1,000 mL (04/16/19 2156)  Total time spent: 25 minutes.   LOS: 18 days   Barb Merino, MD [facility] If 7PM-7AM, please contact night-coverage [email or web address] Password [email or web address] 04/18/2019, 1:34 PM

## 2019-04-18 NOTE — Progress Notes (Signed)
1 cup thin barium given through cortrak per IR PA [facility] care order. Barium provided by x-ray. No need to document in [facility] per pharmacy, only sticky note. Pharmacist cleared barium as safe for the patient.

## 2019-04-19 LAB — BASIC METABOLIC PANEL
Anion gap: 10 (ref 5–15)
BUN: 24 mg/dL — ABNORMAL HIGH (ref 8–23)
CO2: 21 mmol/L — ABNORMAL LOW (ref 22–32)
Calcium: 8.4 mg/dL — ABNORMAL LOW (ref 8.9–10.3)
Chloride: 105 mmol/L (ref 98–111)
Creatinine, Ser: 1.72 mg/dL — ABNORMAL HIGH (ref 0.61–1.24)
GFR calc Af Amer: 48 mL/min — ABNORMAL LOW (ref >60)
GFR calc non Af Amer: 41 mL/min — ABNORMAL LOW (ref >60)
Glucose, Bld: 104 mg/dL — ABNORMAL HIGH (ref 70–99)
Potassium: 4 mmol/L (ref 3.5–5.1)
Sodium: 136 mmol/L (ref 135–145)

## 2019-04-19 LAB — CBC
HCT: 31.9 % — ABNORMAL LOW (ref 39.0–52.0)
Hemoglobin: 10.9 g/dL — ABNORMAL LOW (ref 13.0–17.0)
MCH: 27.9 pg (ref 26.0–34.0)
MCHC: 34.2 g/dL (ref 30.0–36.0)
MCV: 81.6 fL (ref 80.0–100.0)
Platelets: 313 10*3/uL (ref 150–400)
RBC: 3.91 MIL/uL — ABNORMAL LOW (ref 4.22–5.81)
RDW: 16 % — ABNORMAL HIGH (ref 11.5–15.5)
WBC: 11.6 10*3/uL — ABNORMAL HIGH (ref 4.0–10.5)
nRBC: 0 % (ref 0.0–0.2)

## 2019-04-19 LAB — GLUCOSE, CAPILLARY
Glucose-Capillary: 106 mg/dL — ABNORMAL HIGH (ref 70–99)
Glucose-Capillary: 109 mg/dL — ABNORMAL HIGH (ref 70–99)
Glucose-Capillary: 124 mg/dL — ABNORMAL HIGH (ref 70–99)
Glucose-Capillary: 131 mg/dL — ABNORMAL HIGH (ref 70–99)
Glucose-Capillary: 91 mg/dL (ref 70–99)
Glucose-Capillary: 96 mg/dL (ref 70–99)

## 2019-04-19 LAB — SARS CORONAVIRUS 2 BY RT PCR (HOSPITAL ORDER, PERFORMED IN ~~LOC~~ HOSPITAL LAB): SARS Coronavirus 2: NEGATIVE

## 2019-04-19 LAB — PROTIME-INR
INR: 1.1 (ref 0.8–1.2)
Prothrombin Time: 13.7 seconds (ref 11.4–15.2)

## 2019-04-19 NOTE — Progress Notes (Signed)
PEG scheduled for 0900 on 9/22 per IR. Nothing per tube after MN. No heparin after MN. OK to restart tube feeds until MN.

## 2019-04-19 NOTE — Progress Notes (Signed)
Patient transported to IR 

## 2019-04-19 NOTE — TOC Progression Note (Signed)
Transition of Care [facility]) - Progression Note    Patient Details  Name: Chad Mcclure MRN: [id number] Date of Birth: 04-06-1956  Transition of Care [facility]) CM/SW [place], [place] Phone Number: 04/19/2019, 1:56 PM  Clinical Narrative:  CSW following for discharge. Noting that patient getting peg tube placed today, plan for DC to SNF tomorrow. CSW reached out to [facility] to ensure they are ready to take him back tomorrow, sent clinical updates to them. [facility] will need to know what type of tube feeding the patient will be receiving after his peg is placed to be sure that they have supplies on site and don't need to order any.   CSW also spoke with son, [name]. CSW informed [name] that, due to the patient only having [place], there are no other options except to send him back to [facility]. [name] asked how to get him placed in [place] as both he and his sister live in [place], and Celina directed him to reach out to [place] about how to get the patient's Medicaid switched over to find a new long term care facility for him.  CSW to follow.     Expected Discharge Plan: [place] Barriers to Discharge: Continued Medical Work up  Expected Discharge Plan and Services Expected Discharge Plan: [place] arrangements for the past 2 months: [place] Expected Discharge Date: (Pending)                                     Social Determinants of Health (SDOH) Interventions    Readmission Risk Interventions No flowsheet data found.

## 2019-04-19 NOTE — Progress Notes (Signed)
PROGRESS NOTE    Chad Mcclure  GGY:[id number] DOB: 06-20-1956 DOA: 03/31/2019 PCP: Patient, No Pcp Per   Brief Narrative:  Patient is a 63 year old male with H/O HTN, HLD, HCV, Left CVA, DM, schizophrenia, dementia and who resides at SNF in [place] , who initially was admitted to [facility] on 8/20 with fevers.  He was suspected to have pneumonia and cystitis.  He required intubation on 8/21 for acute hypoxic respiratory failure.  Extubated on 8/29 and  did require intubation on 8/31 for status epilepticus. He required initiation of hemodialysis after failing a lasix challenge, TTS schedule planned (R subclavian perm cath placed). He was transferred to [facility] on 9/2 and was under PCCM service.  He has been extubated now and transferred to hospitalist team on 04/06/2019.  He was managed for MRSA associated pneumonia, seizures.  Hospital course remarkable for persistent encephalopathy .  Nephrology was following for dialysis. Now off dialysis.We were waiting further improvement in the mental status so that he can be fed by mouth.   But unfortunately,he has not been able to eat due to persistent encephalopathy. Palliative care consulted  for goals of care discussion.  Family decided to pursue Peg tube placement now.   Assessment & Plan:   Principal Problem:   Acute respiratory failure with hypoxia (HCC) Active Problems:   Status epilepticus ([place])   AKI (acute kidney injury) ([place])   Encephalopathy   Aspiration pneumonia (HCC)   Aspiration into airway   Palliative care encounter   Goals of care, counseling/discussion   Palliative care by specialist   Dysphagia   At high risk for aspiration   History of stroke   Acute metabolic encephalopathy in the setting of chronic illnesses and underlying dementia:  Neurology was following.  EEG on 04/01/2019 showed severe encephalopathy but no seizures.  Prolonged encephalopathy most likely secondary to prolonged seizure episodes/status  epilepticus.  Mostly remains encephalopathic.  Follows simple commands.   Acute hypoxic respiratory failure:Treated for MRSA pneumonia.  Had to be intubated twice.  Self extubated on 9/5.  Currently respiratory status stable on nasal cannula.  On 3-4 L of oxygen per minute. Continue pulmonary toileting as able.  MRSA pneumonia: Completed  the course of vancomycin.  Currently respiratory status is stable.  High risk of aspiration.  Seizure disorder: On Vimpat, Keppra.  Neurology was following.  Currently remains on antiseizure medication through the tube.  AKI suspected secondary to contrast nephropathy: Nephrology following.  Started on dialysis on 8/24 at [facility]. He is having good urine output.  Expecting full renal recovery.  Nephrology was following.  He had permanent cath on the right chest which has been removed. Renal functions stabilizing.   Hypernatremia: Improved.     Persistent Sinus tachycardia: Started on lowe dose metoprolol with response.  Stable.  Hypertension: Continue blood pressure medicines.  Continue to monitor blood pressure.  Stable.  Dysphagia/protein calorie malnutrition: Dietitian following.  Currently on tube feeding. Speech therapy continues to recommend NPO.  Family decided to pursue with PEG tube placement. Interventional radiology consulted and for tentative percutaneous tube today.  Diabetes mellitus: Continue sliding scale insulin.  Stable.  History of schizophrenia/dementia: Continue supportive care.  History of left CVA: Has residual weakness on the left side.  Skilled nursing facility resident.He had slurred speech on baseline.  Debility/deconditioning: PT/OT consulted and recommended skilled nursing facility.  Social worker made aware.  When he is medically ready for discharge, he will be discharged back to skilled nursing facility  at [facility].  Nutrition Problem: Inadequate oral intake Etiology: inability to eat  DVT prophylaxis: Heparin [place] Code  Status: Full Family Communication: None.   Disposition Plan: Back to skilled nursing facility at [facility] after PEG tube placement. Anticipate tomorrow.   consultants: PCCM, neurology, nephrology  Procedures: Intubation, extubation, dialysis catheter placement  Antimicrobials:  Anti-infectives (From admission, onward)   Start     Dose/Rate Route Frequency Ordered Stop   04/07/19 1000  vancomycin (VANCOCIN) 500 mg in sodium chloride 0.9 % 100 mL IVPB     500 mg 100 mL/hr over 60 Minutes Intravenous  Once 04/07/19 0835 04/07/19 1026   04/04/19 0830  vancomycin (VANCOCIN) 1,500 mg in sodium chloride 0.9 % 500 mL IVPB     1,500 mg 250 mL/hr over 120 Minutes Intravenous  Once 04/04/19 0810 04/04/19 1127   04/03/19 1200  vancomycin (VANCOCIN) IVPB 1000 mg/200 mL premix  Status:  Discontinued     1,000 mg 200 mL/hr over 60 Minutes Intravenous Every T-Th-Sa (Hemodialysis) 04/01/19 1253 04/03/19 0744   04/03/19 0746  vancomycin variable dose per unstable renal function (pharmacist dosing)      Does not apply See admin instructions 04/03/19 0746     04/01/19 1400  vancomycin (VANCOCIN) 2,000 mg in sodium chloride 0.9 % 500 mL IVPB     2,000 mg 250 mL/hr over 120 Minutes Intravenous  Once 04/01/19 1253 04/01/19 1900   04/01/19 0800  meropenem (MERREM) 500 mg in sodium chloride 0.9 % 100 mL IVPB  Status:  Discontinued     500 mg 200 mL/hr over 30 Minutes Intravenous Every 24 hours 04/01/19 0059 04/03/19 0757   03/31/19 2315  piperacillin-tazobactam (ZOSYN) IVPB 3.375 g     3.375 g 100 mL/hr over 30 Minutes Intravenous  Once 03/31/19 2300 04/01/19 0031      Subjective: Patient seen and examined.  No overnight events. He is more awake and was able to tell his name to me.  Tube feeding on hold for procedure. On 3 to 4 L of oxygen.  Objective: Vitals:   04/18/19 1846 04/18/19 1950 04/19/19 0415 04/19/19 0927  BP: 123/86 136/76 131/84 108/71  Pulse: (!) 108 88 (!) 102 98  Resp: (!) 27   20   Temp: 98.4 F (36.9 C) 98.5 F (36.9 C) 98.9 F (37.2 C) 97.9 F (36.6 C)  TempSrc: Oral Oral Oral   SpO2: 93% 92% 96% 92%  Weight:   85.9 kg   Height:        Intake/Output Summary (Last 24 hours) at 04/19/2019 1023 Last data filed at 04/19/2019 1610 Gross per 24 hour  Intake -  Output 3350 ml  Net -3350 ml   Filed Weights   04/16/19 0500 04/18/19 0429 04/19/19 0415  Weight: 85 kg 85.6 kg 85.9 kg    Examination:  General exam: Sick looking, not in any distress.  Confused.  Favors the right side. on 3-4 L oxygen. HEENT:PERRL,Oral mucosa moist,   Respiratory system: no wheezes or crackles, conducted airway sounds. Cardiovascular system: S1 & S2 heard, RRR. No JVD, murmurs, rubs, gallops or clicks. Gastrointestinal system: Abdomen is nondistended, soft and nontender. No organomegaly or masses felt. Normal bowel sounds heard. Central nervous system:  confused, minimal response to voice commands.  Left residual hemiparesis with contractures.  Follows very simple commands and opens eyes and tries to speak when called name. Extremities:  , no clubbing ,no cyanosis, distal peripheral pulses palpable. Skin: No rashes, lesions or ulcers,no icterus ,no  pallor  Data Reviewed:   CBC: Recent Labs  Lab 04/13/19 0325 04/17/19 0346 04/19/19 0450  WBC 9.4 9.6 11.6*  NEUTROABS 5.4 5.8  --   HGB 12.1* 11.1* 10.9*  HCT 36.4* 34.5* 31.9*  MCV 85.0 84.4 81.6  PLT 249 262 751   Basic Metabolic Panel: Recent Labs  Lab 04/13/19 0325 04/14/19 0442 04/17/19 0346 04/18/19 0821 04/19/19 0450  NA 153* 143 140 135 136  K 4.1 3.8 4.0 3.9 4.0  CL 120* 113* 106 106 105  CO2 21* 22 21* 21* 21*  GLUCOSE 162* 171* 152* 131* 104*  BUN 43* 33* 27* 25* 24*  CREATININE 2.44* 2.24* 1.87* 1.70* 1.72*  CALCIUM 8.7* 8.2* 8.4* 8.1* 8.4*   GFR: Estimated Creatinine Clearance: 48.2 mL/min (A) (by C-G formula based on SCr of 1.72 mg/dL (H)). Liver Function Tests: No results for input(s): AST, ALT,  ALKPHOS, BILITOT, PROT, ALBUMIN in the last 168 hours. No results for input(s): LIPASE, AMYLASE in the last 168 hours. No results for input(s): AMMONIA in the last 168 hours. Coagulation Profile: Recent Labs  Lab 04/19/19 0450  INR 1.1   Cardiac Enzymes: No results for input(s): CKTOTAL, CKMB, CKMBINDEX, TROPONINI in the last 168 hours. BNP (last 3 results) No results for input(s): PROBNP in the last 8760 hours. HbA1C: No results for input(s): HGBA1C in the last 72 hours. CBG: Recent Labs  Lab 04/18/19 1828 04/18/19 1955 04/19/19 0014 04/19/19 0412 04/19/19 0923  GLUCAP 116* 145* 131* 106* 109*   Lipid Profile: No results for input(s): CHOL, HDL, LDLCALC, TRIG, CHOLHDL, LDLDIRECT in the last 72 hours. Thyroid Function Tests: No results for input(s): TSH, T4TOTAL, FREET4, T3FREE, THYROIDAB in the last 72 hours. Anemia Panel: No results for input(s): VITAMINB12, FOLATE, FERRITIN, TIBC, IRON, RETICCTPCT in the last 72 hours. Sepsis Labs: No results for input(s): PROCALCITON, LATICACIDVEN in the last 168 hours.  No results found for this or any previous visit (from the past 240 hour(s)).   Radiology Studies: No results found.  Scheduled Meds: . amLODipine  10 mg Per Tube Daily  . chlorhexidine  15 mL Mouth Rinse BID  . chlorhexidine gluconate (MEDLINE KIT)  15 mL Mouth Rinse BID  . Chlorhexidine Gluconate Cloth  6 each Topical Q0600  . feeding supplement (PRO-STAT SUGAR FREE 64)  30 mL Per Tube BID  . free water  400 mL Per Tube Q3H  . insulin aspart  0-9 Units Subcutaneous Q4H  . lacosamide  100 mg Per Tube BID  . levETIRAcetam  500 mg Per Tube BID  . mouth rinse  15 mL Mouth Rinse 10 times per day  . mouth rinse  15 mL Mouth Rinse q12n4p  . metoprolol tartrate  12.5 mg Per Tube BID  . pantoprazole sodium  40 mg Per Tube Daily  . sennosides  10 mL Per Tube Daily  . vancomycin variable dose per unstable renal function (pharmacist dosing)   Does not apply See admin  instructions   Continuous Infusions: . feeding supplement (OSMOLITE 1.5 CAL) 1,000 mL (04/16/19 2156)  Total time spent: 25 minutes.   LOS: 19 days   Barb Merino, MD [facility] If 7PM-7AM, please contact night-coverage [email or web address] Password [facility] 04/19/2019, 10:23 AM

## 2019-04-19 NOTE — Plan of Care (Signed)
Pt remains confused at times and restless in bed.  Nurse able to redirect most of time.  Pt has strong-moderate cough but congested sounding only.  Pt has been NPO - TF off - since MN - for PEG Placement later today.  Problem: Coping: Goal: Ability to adjust to condition or change in health will improve Outcome: Not Progressing    Problem: Education: Goal: Expressions of having a comfortable level of knowledge regarding the disease process will increase Outcome: Progressing   Problem: Coping: Goal: Ability to identify appropriate support needs will improve Outcome: Progressing   Problem: Health Behavior/Discharge Planning: Goal: Compliance with prescribed medication regimen will improve Outcome: Progressing   Problem: Medication: Goal: Risk for medication side effects will decrease Outcome: Progressing   Problem: Clinical Measurements: Goal: Complications related to the disease process, condition or treatment will be avoided or minimized Outcome: Progressing Goal: Diagnostic test results will improve Outcome: Progressing   Problem: Safety: Goal: Verbalization of understanding the information provided will improve Outcome: Progressing   Problem: Self-Concept: Goal: Level of anxiety will decrease Outcome: Progressing Goal: Ability to verbalize feelings about condition will improve Outcome: Progressing   Problem: Fluid Volume: Goal: Compliance with measures to maintain balanced fluid volume will improve Outcome: Progressing   Problem: Clinical Measurements: Goal: Complications related to the disease process, condition or treatment will be avoided or minimized Outcome: Progressing

## 2019-04-20 ENCOUNTER — Other Ambulatory Visit: Payer: Self-pay

## 2019-04-20 ENCOUNTER — Encounter (HOSPITAL_COMMUNITY): Payer: Self-pay | Admitting: *Deleted

## 2019-04-20 ENCOUNTER — Inpatient Hospital Stay (HOSPITAL_COMMUNITY): Payer: Medicaid Other

## 2019-04-20 HISTORY — PX: IR GASTROSTOMY TUBE MOD SED: IMG625

## 2019-04-20 LAB — GLUCOSE, CAPILLARY
Glucose-Capillary: 101 mg/dL — ABNORMAL HIGH (ref 70–99)
Glucose-Capillary: 131 mg/dL — ABNORMAL HIGH (ref 70–99)
Glucose-Capillary: 166 mg/dL — ABNORMAL HIGH (ref 70–99)
Glucose-Capillary: 187 mg/dL — ABNORMAL HIGH (ref 70–99)
Glucose-Capillary: 95 mg/dL (ref 70–99)

## 2019-04-20 MED ORDER — FENTANYL CITRATE (PF) 100 MCG/2ML IJ SOLN
INTRAMUSCULAR | Status: AC
  Filled 2019-04-20: qty 2

## 2019-04-20 MED ORDER — GLUCAGON HCL RDNA (DIAGNOSTIC) 1 MG IJ SOLR
INTRAMUSCULAR | Status: AC | PRN
  Administered 2019-04-20: 1 mg via INTRAVENOUS

## 2019-04-20 MED ORDER — CEFAZOLIN SODIUM-DEXTROSE 2-4 GM/100ML-% IV SOLN
INTRAVENOUS | Status: AC
  Administered 2019-04-20: 09:00:00 2000 mg
  Filled 2019-04-20: qty 100

## 2019-04-20 MED ORDER — LIDOCAINE HCL 1 % IJ SOLN
INTRAMUSCULAR | Status: AC
  Filled 2019-04-20: qty 20

## 2019-04-20 MED ORDER — MIDAZOLAM HCL 2 MG/2ML IJ SOLN
INTRAMUSCULAR | Status: AC | PRN
  Administered 2019-04-20: 0.5 mg via INTRAVENOUS

## 2019-04-20 MED ORDER — FENTANYL CITRATE (PF) 100 MCG/2ML IJ SOLN
INTRAMUSCULAR | Status: AC | PRN
  Administered 2019-04-20: 50 ug via INTRAVENOUS

## 2019-04-20 MED ORDER — MIDAZOLAM HCL 2 MG/2ML IJ SOLN
INTRAMUSCULAR | Status: AC
  Filled 2019-04-20: qty 2

## 2019-04-20 MED ORDER — LIDOCAINE HCL 1 % IJ SOLN
INTRAMUSCULAR | Status: AC | PRN
  Administered 2019-04-20: 8 mL

## 2019-04-20 MED ORDER — GLUCAGON HCL RDNA (DIAGNOSTIC) 1 MG IJ SOLR
INTRAMUSCULAR | Status: AC
  Filled 2019-04-20: qty 1

## 2019-04-20 MED ORDER — IOHEXOL 300 MG/ML  SOLN: 50.0000 mL | Freq: Once | INTRAMUSCULAR | Status: DC | PRN

## 2019-04-20 NOTE — Procedures (Signed)
Pre procedure Dx: Dysphagia Post Procedure Dx: Same  Successful fluoroscopic guided insertion of gastrostomy tube.   The gastrostomy tube may be used immediately for medications.   Tube feeds may be initiated in 24 hours as per the primary team.    EBL: None Complications: None immediate  Jay Jania Steinke, MD Pager #: [phone]    

## 2019-04-20 NOTE — Sedation Documentation (Signed)
ETCO2 unable to properly read due to patient moth breathing

## 2019-04-20 NOTE — TOC Progression Note (Signed)
Transition of Care [facility]) - Progression Note    Patient Details  Name: Chad Mcclure MRN: [id number] Date of Birth: February 04, 1956  Transition of Care [facility]) CM/SW [place], [place] Phone Number: 04/20/2019, 11:57 AM  Clinical Narrative:  CSW noting per chart review that patient just got peg placed today. Patient will DC to [facility]. CSW updated Admissions at [facility], they will be ready for patient tomorrow.     Expected Discharge Plan: Skilled Nursing Facility Barriers to Discharge: Continued Medical Work up(PEG placement today and then SNF tomorrow)  Expected Discharge Plan and Services Expected Discharge Plan: [place] arrangements for the past 2 months: [place] Expected Discharge Date: (Pending)                                     Social Determinants of Health (SDOH) Interventions    Readmission Risk Interventions No flowsheet data found.

## 2019-04-20 NOTE — NC FL2 (Signed)
[place] LEVEL OF CARE SCREENING TOOL     IDENTIFICATION  Patient Name: Chad Mcclure Birthdate: 09-Jan-1956 Sex: male Admission Date (Current Location): 03/31/2019  [place] and [place] Number:  Chad Mcclure, [place])   Facility and Address:  [facility]. [facility], [place] [address], [place], [place] [address]      Provider Number: [id number]  Attending Physician Name and Address:  Barb Merino, MD  Relative Name and Phone Number:       Current Level of Care: Hospital Recommended Level of Care: [place] Prior Approval Number:    Date Approved/Denied:   PASRR Number:    Discharge Plan: SNF    Current Diagnoses: Patient Active Problem List   Diagnosis Date Noted  . Palliative care by specialist   . Dysphagia   . At high risk for aspiration   . History of stroke   . Aspiration into airway   . Palliative care encounter   . Goals of care, counseling/discussion   . Aspiration pneumonia ([place]) 04/01/2019  . CVA (cerebral vascular accident) ([place]) 03/31/2019  . Acute respiratory failure with hypoxia ([place])   . Status epilepticus ([place])   . AKI (acute kidney injury) ([place])   . Encephalopathy   . Seizures ([place])     Orientation RESPIRATION BLADDER Height & Weight     Self  O2([place] 4L) Incontinent Weight: 189 lb 6 oz (85.9 kg) Height:  6' (182.9 cm)  BEHAVIORAL SYMPTOMS/MOOD NEUROLOGICAL BOWEL NUTRITION STATUS    Convulsions/Seizures Incontinent Feeding tube(osmolite 1,000 mL every 24 hours)  AMBULATORY STATUS COMMUNICATION OF NEEDS Skin   Extensive Assist Verbally Normal                       Personal Care Assistance Level of Assistance  Bathing, Feeding, Dressing Bathing Assistance: Maximum assistance Feeding assistance: Maximum assistance Dressing Assistance: Maximum assistance     Functional Limitations Info  Sight, Hearing, Speech Sight Info: Adequate Hearing Info: Adequate Speech Info: Impaired(dysarthria)     SPECIAL CARE FACTORS FREQUENCY                       Contractures Contractures Info: Not present    Additional Factors Info  Code Status, Allergies, Insulin Sliding Scale, Isolation Precautions Code Status Info: Full Allergies Info: None on file   Insulin Sliding Scale Info: 0-9 units every 4 hours Isolation Precautions Info: Contact, MRSA in lungs     Current Medications (04/20/2019):  This is the current hospital active medication list Current Facility-Administered Medications  Medication Dose Route Frequency Provider Last Rate Last Dose  . acetaminophen (TYLENOL) solution 500 mg  500 mg Per Tube Q6H PRN Margaretha Seeds, MD   500 mg at 04/18/19 0944  . amLODipine (NORVASC) tablet 10 mg  10 mg Per Tube Daily Elsie Lincoln, MD   10 mg at 04/20/19 1125  . chlorhexidine (PERIDEX) 0.12 % solution 15 mL  15 mL Mouth Rinse BID Shelly Coss, MD   15 mL at 04/19/19 2203  . chlorhexidine gluconate (MEDLINE KIT) (PERIDEX) 0.12 % solution 15 mL  15 mL Mouth Rinse BID Desai, Rahul P, PA-C   15 mL at 04/20/19 1124  . Chlorhexidine Gluconate Cloth 2 % PADS 6 each  6 each Topical Q0600 Shelly Coss, MD   6 each at 04/20/19 0610  . feeding supplement (OSMOLITE 1.5 CAL) liquid 1,000 mL  1,000 mL Per Tube Continuous Shelly Coss, MD  Stopped at 04/20/19 0012  . feeding supplement (PRO-STAT SUGAR FREE 64) liquid 30 mL  30 mL Per Tube BID Shelly Coss, MD   30 mL at 04/20/19 1124  . fentaNYL (SUBLIMAZE) 100 MCG/2ML injection           . fentaNYL (SUBLIMAZE) injection 50-200 mcg  50-200 mcg Intravenous Q30 min PRN Shearon Stalls, Rahul P, PA-C   100 mcg at 04/04/19 0334  . free water 400 mL  400 mL Per Tube Q3H Pearson Grippe B, MD   400 mL at 04/20/19 1126  . glucagon (human recombinant) (GLUCAGEN) 1 MG injection           . hydrALAZINE (APRESOLINE) injection 10 mg  10 mg Intravenous Q4H PRN Margaretha Seeds, MD   10 mg at 04/04/19 2449  . insulin aspart (novoLOG) injection 0-9 Units  0-9  Units Subcutaneous Q4H Desai, Rahul P, PA-C   2 Units at 04/20/19 0011  . iohexol (OMNIPAQUE) 300 MG/ML solution 50 mL  50 mL Per Tube Once PRN Sandi Mariscal, MD      . lacosamide (VIMPAT) tablet 100 mg  100 mg Per Tube BID Hammons, Kimberly B, RPH   100 mg at 04/20/19 1125  . levETIRAcetam (KEPPRA) 100 MG/ML solution 500 mg  500 mg Per Tube BID Barb Merino, MD   500 mg at 04/19/19 2206  . lidocaine (XYLOCAINE) 1 % (with pres) injection           . LORazepam (ATIVAN) injection 1-4 mg  1-4 mg Intravenous Q2H PRN Shearon Stalls, Rahul P, PA-C   1 mg at 04/17/19 2236  . MEDLINE mouth rinse  15 mL Mouth Rinse q12n4p Shelly Coss, MD   15 mL at 04/20/19 1126  . metoprolol tartrate (LOPRESSOR) tablet 12.5 mg  12.5 mg Per Tube BID Barb Merino, MD   12.5 mg at 04/20/19 1125  . midazolam (VERSED) 2 MG/2ML injection           . morphine 2 MG/ML injection 2 mg  2 mg Intravenous Q4H PRN Shelly Coss, MD   2 mg at 04/16/19 2143  . pantoprazole sodium (PROTONIX) 40 mg/20 mL oral suspension 40 mg  40 mg Per Tube Daily Kipp Brood, MD   40 mg at 04/20/19 1125  . sennosides (SENOKOT) 8.8 MG/5ML syrup 10 mL  10 mL Per Tube Daily Basilio Cairo, NP   10 mL at 04/18/19 0943  . sodium chloride flush (NS) 0.9 % injection 10-40 mL  10-40 mL Intracatheter PRN Chesley Mires, MD      . vancomycin variable dose per unstable renal function (pharmacist dosing)   Does not apply See admin instructions Rush Farmer, MD         Discharge Medications: Please see discharge summary for a list of discharge medications.  Relevant Imaging Results:  Relevant Lab Results:   Additional Information SS#: [id number]  Geralynn Ochs, LCSW

## 2019-04-20 NOTE — Progress Notes (Signed)
PROGRESS NOTE    Chad Mcclure  IOX:[id number] DOB: 01-May-1956 DOA: 03/31/2019 PCP: Chad Mcclure, No Pcp Per   Brief Narrative:  Chad Mcclure is a 63 year old male with H/O HTN, HLD, HCV, Left CVA, DM, schizophrenia, dementia and who resides at SNF in [place] , who initially was admitted to [facility] on 8/20 with fevers.  He was suspected to have pneumonia and cystitis.  He required intubation on 8/21 for acute hypoxic respiratory failure.  Extubated on 8/29 and  did require intubation on 8/31 for status epilepticus. He required initiation of hemodialysis after failing a lasix challenge, TTS schedule planned (R subclavian perm cath placed). He was transferred to [facility] on 9/2 and was under PCCM service.  He has been extubated now and transferred to hospitalist team on 04/06/2019.  He was managed for MRSA associated pneumonia, seizures.  Hospital course remarkable for persistent encephalopathy .  Nephrology was following for dialysis. Now off dialysis.We were waiting further improvement in the mental status so that he can be fed by mouth.   But unfortunately,he has not been able to eat due to persistent encephalopathy. Palliative care consulted  for goals of care discussion. Family decided to pursue Peg tube placement. Underwent IR peg tube placement today , 9/22 Still remains on some supplemental oxygen.  Assessment & Plan:   Principal Problem:   Acute respiratory failure with hypoxia (HCC) Active Problems:   Status epilepticus ([place])   AKI (acute kidney injury) ([place])   Encephalopathy   Aspiration pneumonia (HCC)   Aspiration into airway   Palliative care encounter   Goals of care, counseling/discussion   Palliative care by specialist   Dysphagia   At high risk for aspiration   History of stroke   Acute metabolic encephalopathy in the setting of chronic illnesses and underlying dementia:  Neurology was following.  EEG on 04/01/2019 showed severe encephalopathy but no seizures.   Prolonged encephalopathy most likely secondary to prolonged seizure episodes/status epilepticus.  Mostly remains encephalopathic.  Follows simple commands.   Acute hypoxic respiratory failure:Treated for MRSA pneumonia.  Had to be intubated twice.  Self extubated on 9/5.  Currently respiratory status stable on nasal cannula.  On 3-4 L of oxygen per minute. Continue pulmonary toileting as able. High risk of aspiration.  MRSA pneumonia: Completed  the course of vancomycin.  Currently respiratory status is stable.  High risk of aspiration.  He will continue to need some oxygen on discharge.  Seizure disorder: On Vimpat, Keppra.  Neurology was following.  Currently remains on antiseizure medication through the tube.  Changed to PEG tube.  AKI suspected secondary to contrast nephropathy: Nephrology following.  Started on dialysis on 8/24 at [facility]. He is having good urine output.  Nephrology was following.  He had permanent cath on the right chest which has been removed. Renal functions stabilizing.  We will recheck in the morning.  Hypernatremia: Improved.     Persistent Sinus tachycardia: Started on lowe dose metoprolol with response.  Stable.  Hypertension: Continue blood pressure medicines.  Continue to monitor blood pressure.  Stable.  Dysphagia/protein calorie malnutrition: Now with PEG tube.  Nutrition to follow and start on PEG tube feeding.  Radiology recommended to start feeding tomorrow morning after 24 hours. Chad Mcclure is already on feeding via NG tube, he will have no problem tolerating PEG tube feeding.  Will recheck electrolytes in the morning.  Diabetes mellitus: Continue sliding scale insulin.  Stable.  History of schizophrenia/dementia: Continue supportive care.  Long-term nursing home  resident.  History of left CVA: Has residual weakness on the left side.  Skilled nursing facility resident.He had slurred speech on baseline.  Debility/deconditioning: PT/OT consulted and  recommended skilled nursing facility.  Social worker made aware.  When he is medically ready for discharge, he will be discharged back to skilled nursing facility at [facility].  Nutrition Problem: Inadequate oral intake Etiology: inability to eat  DVT prophylaxis: Heparin  Code Status: Full Family Communication: None.   Disposition Plan: Back to skilled nursing facility at [facility].  Will monitor after PEG tube placement and starting feeding.  consultants: PCCM, neurology, nephrology  Procedures: Intubation, extubation, dialysis catheter placement IR guided percutaneous PEG tube placement, 04/20/2019  Antimicrobials:  Anti-infectives (From admission, onward)   Start     Dose/Rate Route Frequency Ordered Stop   04/20/19 0830  ceFAZolin (ANCEF) 2-4 GM/100ML-% IVPB    Note to Pharmacy: Manuela Neptune   : cabinet override      04/20/19 0830 04/20/19 0845   04/07/19 1000  vancomycin (VANCOCIN) 500 mg in sodium chloride 0.9 % 100 mL IVPB     500 mg 100 mL/hr over 60 Minutes Intravenous  Once 04/07/19 0835 04/07/19 1026   04/04/19 0830  vancomycin (VANCOCIN) 1,500 mg in sodium chloride 0.9 % 500 mL IVPB     1,500 mg 250 mL/hr over 120 Minutes Intravenous  Once 04/04/19 0810 04/04/19 1127   04/03/19 1200  vancomycin (VANCOCIN) IVPB 1000 mg/200 mL premix  Status:  Discontinued     1,000 mg 200 mL/hr over 60 Minutes Intravenous Every T-Th-Sa (Hemodialysis) 04/01/19 1253 04/03/19 0744   04/03/19 0746  vancomycin variable dose per unstable renal function (pharmacist dosing)      Does not apply See admin instructions 04/03/19 0746     04/01/19 1400  vancomycin (VANCOCIN) 2,000 mg in sodium chloride 0.9 % 500 mL IVPB     2,000 mg 250 mL/hr over 120 Minutes Intravenous  Once 04/01/19 1253 04/01/19 1900   04/01/19 0800  meropenem (MERREM) 500 mg in sodium chloride 0.9 % 100 mL IVPB  Status:  Discontinued     500 mg 200 mL/hr over 30 Minutes Intravenous Every 24 hours 04/01/19 0059 04/03/19 0757    03/31/19 2315  piperacillin-tazobactam (ZOSYN) IVPB 3.375 g     3.375 g 100 mL/hr over 30 Minutes Intravenous  Once 03/31/19 2300 04/01/19 0031      Subjective: Chad Mcclure seen and examined.  No overnight events.  Came back from IR.  He was somehow responding to name and following simple commands.  Remains restless.  Objective: Vitals:   04/20/19 0910 04/20/19 0915 04/20/19 0920 04/20/19 1112  BP: 120/83 120/83 124/79 (!) 139/92  Pulse: 98 (!) 105 (!) 102 (!) 102  Resp: 20 18 20 20   Temp:    98.5 F (36.9 C)  TempSrc:    Oral  SpO2: 94% 95% 94% 97%  Weight:      Height:        Intake/Output Summary (Last 24 hours) at 04/20/2019 1323 Last data filed at 04/20/2019 1046 Gross per 24 hour  Intake --  Output 2300 ml  Net -2300 ml   Filed Weights   04/16/19 0500 04/18/19 0429 04/19/19 0415  Weight: 85 kg 85.6 kg 85.9 kg    Examination:  General exam: Sick looking, not in any distress.  Confused.  Favors the right side. on 3-4 L oxygen. HEENT:PERRL,Oral mucosa moist,   Respiratory system: no wheezes or crackles, conducted airway sounds. Cardiovascular system:  S1 & S2 heard, RRR. No JVD, murmurs, rubs, gallops or clicks. Gastrointestinal system: Abdomen is nondistended, soft and nontender. No organomegaly or masses felt. Normal bowel sounds heard. Central nervous system:  confused, minimal response to voice commands.  Left residual hemiparesis with contractures.  Follows very simple commands and opens eyes and tries to speak when called name. Extremities:  , no clubbing ,no cyanosis, distal peripheral pulses palpable. Skin: No rashes, lesions or ulcers,no icterus ,no pallor  Data Reviewed:   CBC: Recent Labs  Lab 04/17/19 0346 04/19/19 0450  WBC 9.6 11.6*  NEUTROABS 5.8  --   HGB 11.1* 10.9*  HCT 34.5* 31.9*  MCV 84.4 81.6  PLT 262 726   Basic Metabolic Panel: Recent Labs  Lab 04/14/19 0442 04/17/19 0346 04/18/19 0821 04/19/19 0450  NA 143 140 135 136  K 3.8  4.0 3.9 4.0  CL 113* 106 106 105  CO2 22 21* 21* 21*  GLUCOSE 171* 152* 131* 104*  BUN 33* 27* 25* 24*  CREATININE 2.24* 1.87* 1.70* 1.72*  CALCIUM 8.2* 8.4* 8.1* 8.4*   GFR: Estimated Creatinine Clearance: 48.2 mL/min (A) (by C-G formula based on SCr of 1.72 mg/dL (H)). Liver Function Tests: No results for input(s): AST, ALT, ALKPHOS, BILITOT, PROT, ALBUMIN in the last 168 hours. No results for input(s): LIPASE, AMYLASE in the last 168 hours. No results for input(s): AMMONIA in the last 168 hours. Coagulation Profile: Recent Labs  Lab 04/19/19 0450  INR 1.1   Cardiac Enzymes: No results for input(s): CKTOTAL, CKMB, CKMBINDEX, TROPONINI in the last 168 hours. BNP (last 3 results) No results for input(s): PROBNP in the last 8760 hours. HbA1C: No results for input(s): HGBA1C in the last 72 hours. CBG: Recent Labs  Lab 04/19/19 1753 04/19/19 2036 04/20/19 0002 04/20/19 0447 04/20/19 1108  GLUCAP 91 124* 166* 101* 131*   Lipid Profile: No results for input(s): CHOL, HDL, LDLCALC, TRIG, CHOLHDL, LDLDIRECT in the last 72 hours. Thyroid Function Tests: No results for input(s): TSH, T4TOTAL, FREET4, T3FREE, THYROIDAB in the last 72 hours. Anemia Panel: No results for input(s): VITAMINB12, FOLATE, FERRITIN, TIBC, IRON, RETICCTPCT in the last 72 hours. Sepsis Labs: No results for input(s): PROCALCITON, LATICACIDVEN in the last 168 hours.  Recent Results (from the past 240 hour(s))  SARS Coronavirus 2 [facility] order, Performed in [facility] hospital lab) Nasopharyngeal Nasopharyngeal Swab     Status: None   Collection Time: 04/19/19 11:05 AM   Specimen: Nasopharyngeal Swab  Result Value Ref Range Status   SARS Coronavirus 2 NEGATIVE NEGATIVE Final    Comment: (NOTE) If result is NEGATIVE SARS-CoV-2 target nucleic acids are NOT DETECTED. The SARS-CoV-2 RNA is generally detectable in upper and lower  respiratory specimens during the acute phase of infection. The lowest    concentration of SARS-CoV-2 viral copies this assay can detect is 250  copies / mL. A negative result does not preclude SARS-CoV-2 infection  and should not be used as the sole basis for treatment or other  Chad Mcclure management decisions.  A negative result may occur with  improper specimen collection / handling, submission of specimen other  than nasopharyngeal swab, presence of viral mutation(s) within the  areas targeted by this assay, and inadequate number of viral copies  (<250 copies / mL). A negative result must be combined with clinical  observations, Chad Mcclure history, and epidemiological information. If result is POSITIVE SARS-CoV-2 target nucleic acids are DETECTED. The SARS-CoV-2 RNA is generally detectable in upper and lower  respiratory specimens dur ing the acute phase of infection.  Positive  results are indicative of active infection with SARS-CoV-2.  Clinical  correlation with Chad Mcclure history and other diagnostic information is  necessary to determine Chad Mcclure infection status.  Positive results do  not rule out bacterial infection or co-infection with other viruses. If result is PRESUMPTIVE POSTIVE SARS-CoV-2 nucleic acids MAY BE PRESENT.   A presumptive positive result was obtained on the submitted specimen  and confirmed on repeat testing.  While 2019 novel coronavirus  (SARS-CoV-2) nucleic acids may be present in the submitted sample  additional confirmatory testing may be necessary for epidemiological  and / or clinical management purposes  to differentiate between  SARS-CoV-2 and other Sarbecovirus currently known to infect humans.  If clinically indicated additional testing with an alternate test  methodology [id number]) is advised. The SARS-CoV-2 RNA is generally  detectable in upper and lower respiratory sp ecimens during the acute  phase of infection. The expected result is Negative. Fact Sheet for Patients:  StrictlyIdeas.no Fact Sheet  for Healthcare Providers: BankingDealers.co.za This test is not yet approved or cleared by the [place] FDA and has been authorized for detection and/or diagnosis of SARS-CoV-2 by FDA under an Emergency Use Authorization (EUA).  This EUA will remain in effect (meaning this test can be used) for the duration of the COVID-19 declaration under Section 564(b)(1) of the Act, 21 U.S.C. section 360bbb-3(b)(1), unless the authorization is terminated or revoked sooner. Performed at [facility], [place] [address] [place], [place] [address]      Radiology Studies: Dg Abd 1 View  Result Date: 04/20/2019 CLINICAL DATA:  Preop gastrostomy placement. EXAM: ABDOMEN - 1 VIEW COMPARISON:  Radiograph of April 09, 2019. FINDINGS: No abnormal bowel dilatation is noted. Residual contrast is seen throughout the nondilated colon. Distal tip of Dobbhoff tube is seen in expected position of distal stomach. No radio-opaque calculi or other significant radiographic abnormality are seen. IMPRESSION: No evidence of bowel obstruction or ileus. Residual contrast remains throughout nondilated colon. Electronically Signed   By: Marijo Conception M.D.   On: 04/20/2019 09:17   Ir Gastrostomy Tube Mod Sed  Result Date: 04/20/2019 INDICATION: Dysphagia. Please place percutaneous gastrostomy tube for enteric nutrition supplementation purposes. EXAM: PULL TROUGH GASTROSTOMY TUBE PLACEMENT COMPARISON:  None. MEDICATIONS: Ancef 2 gm IV; Antibiotics were administered within 1 hour of the procedure. Glucagon 1 mg IV CONTRAST:  30 mL of Omnipaque 300 administered into the gastric lumen. ANESTHESIA/SEDATION: Moderate (conscious) sedation was employed during this procedure. A total of Versed 0.5 mg and Fentanyl 50 mcg was administered intravenously. Moderate Sedation Time: 10 minutes. The Chad Mcclure's level of consciousness and vital signs were monitored continuously by radiology nursing throughout the procedure  under my direct supervision. FLUOROSCOPY TIME:  3 minutes, 48 seconds (35 mGy) COMPLICATIONS: None immediate. PROCEDURE: Informed written consent was obtained from the Chad Mcclure's family following explanation of the procedure, risks, benefits and alternatives. A time out was performed prior to the initiation of the procedure. Ultrasound scanning was performed to demarcate the edge of the left lobe of the liver. Maximal barrier sterile technique utilized including caps, mask, sterile gowns, sterile gloves, large sterile drape, hand hygiene and Betadine prep. The left upper quadrant was sterilely prepped and draped. An oral gastric catheter was inserted into the stomach under fluoroscopy. The existing nasogastric feeding tube was removed. The left costal margin and barium/air opacified transverse colon were identified and avoided. Air was injected into the stomach  for insufflation and visualization under fluoroscopy. Under sterile conditions a 17 gauge trocar needle was utilized to access the stomach percutaneously beneath the left subcostal margin after the overlying soft tissues were anesthetized with 1% Lidocaine with epinephrine. Needle position was confirmed within the stomach with aspiration of air and injection of small amount of contrast. A single T tack was deployed for gastropexy. Over an Amplatz guide wire, a 9-French sheath was inserted into the stomach. A snare device was utilized to capture the oral gastric catheter. The snare device was pulled retrograde from the stomach up the esophagus and out the oropharynx. The 20-French pull-through gastrostomy was connected to the snare device and pulled antegrade through the oropharynx down the esophagus into the stomach and then through the percutaneous tract external to the Chad Mcclure. The gastrostomy was assembled externally. Contrast injection confirms position in the stomach. Several spot radiographic images were obtained in various obliquities for documentation.  The Chad Mcclure tolerated procedure well without immediate post procedural complication. FINDINGS: After successful fluoroscopic guided placement, the gastrostomy tube is appropriately positioned with internal disc against the ventral aspect of the gastric lumen. IMPRESSION: Successful fluoroscopic insertion of a 20-French pull-through gastrostomy tube. The gastrostomy may be used immediately for medication administration and in 24 hrs for the initiation of feeds. Electronically Signed   By: Sandi Mariscal M.D.   On: 04/20/2019 09:42    Scheduled Meds:  amLODipine  10 mg Per Tube Daily   chlorhexidine  15 mL Mouth Rinse BID   chlorhexidine gluconate (MEDLINE KIT)  15 mL Mouth Rinse BID   Chlorhexidine Gluconate Cloth  6 each Topical Q0600   feeding supplement (PRO-STAT SUGAR FREE 64)  30 mL Per Tube BID   fentaNYL       free water  400 mL Per Tube Q3H   glucagon (human recombinant)       insulin aspart  0-9 Units Subcutaneous Q4H   lacosamide  100 mg Per Tube BID   levETIRAcetam  500 mg Per Tube BID   lidocaine       mouth rinse  15 mL Mouth Rinse q12n4p   metoprolol tartrate  12.5 mg Per Tube BID   midazolam       pantoprazole sodium  40 mg Per Tube Daily   sennosides  10 mL Per Tube Daily   vancomycin variable dose per unstable renal function (pharmacist dosing)   Does not apply See admin instructions   Continuous Infusions:  feeding supplement (OSMOLITE 1.5 CAL) Stopped (04/20/19 0012)  Total time spent: 25 minutes.   LOS: 20 days   Barb Merino, MD [facility] If 7PM-7AM, please contact night-coverage [email or web address] Password [email or web address] 04/20/2019, 1:23 PM

## 2019-04-20 NOTE — Progress Notes (Addendum)
Nutrition Follow-up  DOCUMENTATION CODES:   Not applicable  INTERVENTION:  24 hours after PEG placement, PEG may be used for feeding per IR.  Tomorrow 9/23 at 0930, initiate Osmolite 1.5 formula @ 35 ml/hr and increase by 10 ml every 4 hours to goal rate of 55 ml/hr.   Provide 30 ml Prostat BID per tube.   Free water flushes of 400 ml every 3 hours per MD.   Tube feeding regimen provides 2180 kcal (100% of needs), 113 grams of protein, and 4203 ml of H2O.   NUTRITION DIAGNOSIS:   Inadequate oral intake related to inability to eat as evidenced by NPO status; ongoing  GOAL:   Patient will meet greater than or equal to 90% of their needs; progressing  MONITOR:   TF tolerance, Weight trends, Labs, I & O's, Skin  REASON FOR ASSESSMENT:   Ventilator, Consult Enteral/tube feeding initiation and management  ASSESSMENT:   63 year old male who presented on 9/02 from [place] (admitted 8/20) with fevers suspected to be related to sepsis secondary to pneumonia and cystitis. Pt was intubated 8/21 and failed extubation after gross aspiration and status epilepticus which prompted transfer to [facility]. PMH of HTN, HLD, left CVA, DM, hepatitis C, schizophrenia, dementia. Pt off dialysis. Pt with MRSA PNA. Extubated 9/6. Encephalopathy secondary to seizures. Cortrak NGT placed 9/11.   PEG placed today. Per IR, may use PEG for tube feedings 24 hour after tube placement. Order put in place to restart feedings tomorrow at 0930. Tube feeding recommendations stated above. RD to monitor for tolerance.   Labs and medications reviewed.   Diet Order:   Diet Order            Diet NPO time specified  Diet effective now              EDUCATION NEEDS:   No education needs have been identified at this time  Skin:  Skin Assessment: Reviewed RN Assessment  Last BM:  9/20  Height:   Ht Readings from Last 1 Encounters:  03/31/19 6' (1.829 m)    Weight:   Wt Readings from Last 1  Encounters:  04/19/19 85.9 kg    Ideal Body Weight:  80.9 kg  BMI:  Body mass index is 25.68 kg/m.  Estimated Nutritional Needs:   Kcal:  2100-2300  Protein:  110-125 grams  Fluid:  >/= 2  L/day    Corrin Parker, MS, RD, LDN Pager # [phone] After hours/ weekend pager # [phone]

## 2019-04-20 NOTE — Progress Notes (Signed)
Physical Therapy Treatment Patient Details Name: Chad Mcclure MRN: [id number] DOB: 01/31/1956 Today's Date: 04/20/2019    History of Present Illness Chad Mcclure is a 63 y.o. male who has a PMH including but not limited to HTN, HLD, HCV, Left CVA, DM, schizophrenia, dementia and who resides at [facility] in [place].  He was admitted to [facility] on 03/18/19 with fever due PNA, required intubated on 8/21 for hypoxic respiratory failure, extubated 8/29 then required re-intubation 8/31 for staus epilepticus then transferred to [facility] 9/2.    PT Comments    Patient received in bed, awake, alert, able to tell me his name. Very restless. Trying to get out of bed. Moving legs all around. He is able to follow some direction to participate in LE exercises, but not very effectively, requiring min assist. Patient is unsafe to attempt to sit up on edge of the bed due to restlessness and agitation at this time. Patient will benefit from continued skilled PT to improve safety and mobility.        Follow Up Recommendations  SNF     Equipment Recommendations  None recommended by PT    Recommendations for Other Services       Precautions / Restrictions Precautions Precautions: Fall Precaution Comments: PEG tube, mitt on right UE Required Braces or Orthoses: Other Brace Other Brace: resting hand splint L UE Restrictions Weight Bearing Restrictions: No    Mobility  Bed Mobility Overal bed mobility: Needs Assistance Bed Mobility: Supine to Sit;Sit to Supine           General bed mobility comments: patient attempting to get out of bed, moving legs restlessly.  Transfers                 General transfer comment: NT due to limited sitting balance/safety  Ambulation/Gait             General Gait Details: deferred today   Stairs             Wheelchair Mobility    Modified Rankin (Stroke Patients Only)       Balance                                             Cognition Arousal/Alertness: Awake/alert Behavior During Therapy: Flat affect;Restless Overall Cognitive Status: No family/caregiver present to determine baseline cognitive functioning Area of Impairment: Following commands;Safety/judgement;Awareness;Problem solving                   Current Attention Level: Selective   Following Commands: Follows one step commands inconsistently Safety/Judgement: Decreased awareness of safety     General Comments: Followed some commands, but patient is very restless this date. Unsafe to attempt sitting EOB.      Exercises Other Exercises Other Exercises: Patient able to follow direction for some LE exercises: heel slides, SLR x 10 reps each with assist.    General Comments        Pertinent Vitals/Pain Pain Assessment: No/denies pain    Home Living                      Prior Function            PT Goals (current goals can now be found in the care plan section) Acute Rehab PT Goals Patient Stated Goal: unable PT Goal Formulation: Patient unable  to participate in goal setting Time For Goal Achievement: 04/24/19 Progress towards PT goals: Not progressing toward goals - comment(due to restlessness, cognition)    Frequency    Min 2X/week      PT Plan Current plan remains appropriate    Co-evaluation              AM-PAC PT "6 Clicks" Mobility   Outcome Measure  Help needed turning from your back to your side while in a flat bed without using bedrails?: A Lot Help needed moving from lying on your back to sitting on the side of a flat bed without using bedrails?: Total Help needed moving to and from a bed to a chair (including a wheelchair)?: Total Help needed standing up from a chair using your arms (e.g., wheelchair or bedside chair)?: Total Help needed to walk in hospital room?: Total Help needed climbing 3-5 steps with a railing? : Total 6 Click Score: 7    End of Session  Equipment Utilized During Treatment: Oxygen Activity Tolerance: Other (comment)(patient very restless) Patient left: in bed;with bed alarm set Nurse Communication: Mobility status PT Visit Diagnosis: Other abnormalities of gait and mobility (R26.89)     Time: 1245-1300 PT Time Calculation (min) (ACUTE ONLY): 15 min  Charges:  $Therapeutic Exercise: 8-22 mins                     Alexanderia Gorby, PT, GCS 04/20/19,1:03 PM

## 2019-04-21 ENCOUNTER — Inpatient Hospital Stay (HOSPITAL_COMMUNITY): Payer: Medicaid Other

## 2019-04-21 DIAGNOSIS — J9601 Acute respiratory failure with hypoxia: Secondary | ICD-10-CM

## 2019-04-21 LAB — CBC WITH DIFFERENTIAL/PLATELET
Abs Immature Granulocytes: 0.06 10*3/uL (ref 0.00–0.07)
Basophils Absolute: 0 10*3/uL (ref 0.0–0.1)
Basophils Relative: 0 %
Eosinophils Absolute: 0.1 10*3/uL (ref 0.0–0.5)
Eosinophils Relative: 1 %
HCT: 36.5 % — ABNORMAL LOW (ref 39.0–52.0)
Hemoglobin: 12.1 g/dL — ABNORMAL LOW (ref 13.0–17.0)
Immature Granulocytes: 1 %
Lymphocytes Relative: 24 %
Lymphs Abs: 2.2 10*3/uL (ref 0.7–4.0)
MCH: 27.7 pg (ref 26.0–34.0)
MCHC: 33.2 g/dL (ref 30.0–36.0)
MCV: 83.5 fL (ref 80.0–100.0)
Monocytes Absolute: 1.1 10*3/uL — ABNORMAL HIGH (ref 0.1–1.0)
Monocytes Relative: 12 %
Neutro Abs: 5.8 10*3/uL (ref 1.7–7.7)
Neutrophils Relative %: 62 %
Platelets: 346 10*3/uL (ref 150–400)
RBC: 4.37 MIL/uL (ref 4.22–5.81)
RDW: 16.2 % — ABNORMAL HIGH (ref 11.5–15.5)
WBC: 9.3 10*3/uL (ref 4.0–10.5)
nRBC: 0 % (ref 0.0–0.2)

## 2019-04-21 LAB — BLOOD GAS, ARTERIAL
Acid-base deficit: 0.1 mmol/L (ref 0.0–2.0)
Bicarbonate: 23.7 mmol/L (ref 20.0–28.0)
Drawn by: 560031
FIO2: 100
O2 Saturation: 97.4 %
Patient temperature: 98.6
pCO2 arterial: 35.9 mmHg (ref 32.0–48.0)
pH, Arterial: 7.435 (ref 7.350–7.450)
pO2, Arterial: 107 mmHg (ref 83.0–108.0)

## 2019-04-21 LAB — AMMONIA: Ammonia: 22 umol/L (ref 9–35)

## 2019-04-21 LAB — GLUCOSE, CAPILLARY
Glucose-Capillary: 106 mg/dL — ABNORMAL HIGH (ref 70–99)
Glucose-Capillary: 119 mg/dL — ABNORMAL HIGH (ref 70–99)
Glucose-Capillary: 127 mg/dL — ABNORMAL HIGH (ref 70–99)
Glucose-Capillary: 165 mg/dL — ABNORMAL HIGH (ref 70–99)
Glucose-Capillary: 186 mg/dL — ABNORMAL HIGH (ref 70–99)
Glucose-Capillary: 188 mg/dL — ABNORMAL HIGH (ref 70–99)

## 2019-04-21 LAB — BASIC METABOLIC PANEL
Anion gap: 5 (ref 5–15)
BUN: 26 mg/dL — ABNORMAL HIGH (ref 8–23)
CO2: 26 mmol/L (ref 22–32)
Calcium: 8.4 mg/dL — ABNORMAL LOW (ref 8.9–10.3)
Chloride: 107 mmol/L (ref 98–111)
Creatinine, Ser: 1.81 mg/dL — ABNORMAL HIGH (ref 0.61–1.24)
GFR calc Af Amer: 45 mL/min — ABNORMAL LOW (ref >60)
GFR calc non Af Amer: 39 mL/min — ABNORMAL LOW (ref >60)
Glucose, Bld: 220 mg/dL — ABNORMAL HIGH (ref 70–99)
Potassium: 4.5 mmol/L (ref 3.5–5.1)
Sodium: 138 mmol/L (ref 135–145)

## 2019-04-21 LAB — MAGNESIUM: Magnesium: 1.9 mg/dL (ref 1.7–2.4)

## 2019-04-21 LAB — PHOSPHORUS: Phosphorus: 4.5 mg/dL (ref 2.5–4.6)

## 2019-04-21 LAB — PROCALCITONIN: Procalcitonin: 0.22 ng/mL

## 2019-04-21 MED ORDER — NALOXONE HCL 2 MG/2ML IJ SOSY: 1.0000 mg | PREFILLED_SYRINGE | INTRAMUSCULAR | Status: DC | PRN

## 2019-04-21 MED ORDER — LACOSAMIDE 100 MG PO TABS: 100.0000 mg | ORAL_TABLET | Freq: Two times a day (BID) | ORAL | 0 refills | Status: AC

## 2019-04-21 MED ORDER — LORAZEPAM 2 MG/ML IJ SOLN: 1.0000 mg | INTRAMUSCULAR | 0 refills | Status: AC | PRN

## 2019-04-21 MED ORDER — LEVETIRACETAM 500 MG PO TABS: 500.0000 mg | ORAL_TABLET | Freq: Two times a day (BID) | ORAL | 0 refills | Status: AC

## 2019-04-21 MED ORDER — VANCOMYCIN HCL 10 G IV SOLR
1250.0000 mg | INTRAVENOUS | Status: DC
  Administered 2019-04-21: 1250 mg via INTRAVENOUS
  Filled 2019-04-21 (×2): qty 1250

## 2019-04-21 MED ORDER — PANTOPRAZOLE SODIUM 40 MG PO PACK: 40.0000 mg | PACK | Freq: Every day | ORAL | Status: AC

## 2019-04-21 MED ORDER — HEPARIN SODIUM (PORCINE) 5000 UNIT/ML IJ SOLN
5000.0000 [IU] | Freq: Three times a day (TID) | INTRAMUSCULAR | Status: DC
  Administered 2019-04-21 – 2019-04-24 (×9): 5000 [IU] via SUBCUTANEOUS
  Filled 2019-04-21 (×8): qty 1

## 2019-04-21 MED ORDER — NALOXONE HCL 0.4 MG/ML IJ SOLN: 0.4000 mg | INTRAMUSCULAR | Status: DC | PRN

## 2019-04-21 MED ORDER — PIPERACILLIN-TAZOBACTAM 3.375 G IVPB
3.3750 g | Freq: Three times a day (TID) | INTRAVENOUS | Status: DC
  Administered 2019-04-21 – 2019-04-22 (×3): 3.375 g via INTRAVENOUS
  Filled 2019-04-21 (×5): qty 50

## 2019-04-21 MED ORDER — METOPROLOL TARTRATE 25 MG PO TABS: 12.5000 mg | ORAL_TABLET | Freq: Two times a day (BID) | ORAL | 0 refills | Status: AC

## 2019-04-21 MED ORDER — TRAZODONE HCL 50 MG PO TABS: 12.5000 mg | ORAL_TABLET | Freq: Every day | ORAL | 0 refills | Status: AC

## 2019-04-21 MED ORDER — OSMOLITE 1.5 CAL PO LIQD: 1000.0000 mL | ORAL | 0 refills | Status: AC

## 2019-04-21 MED ORDER — NALOXONE HCL 0.4 MG/ML IJ SOLN
INTRAMUSCULAR | Status: AC
  Administered 2019-04-21: 0.4 mg
  Filled 2019-04-21: qty 1

## 2019-04-21 MED ORDER — METOPROLOL TARTRATE 5 MG/5ML IV SOLN: 5.0000 mg | INTRAVENOUS | Status: DC | PRN

## 2019-04-21 MED ORDER — NALOXONE HCL 4 MG/10ML IJ SOLN
1.0000 mg/h | INTRAVENOUS | Status: DC
  Administered 2019-04-21: 1 mg/h via INTRAVENOUS
  Filled 2019-04-21 (×2): qty 10

## 2019-04-21 MED ORDER — FREE WATER: 400.0000 mL | Status: DC

## 2019-04-21 MED ORDER — NALOXONE HCL 0.4 MG/ML IJ SOLN
INTRAMUSCULAR | Status: AC
  Administered 2019-04-21: 12:00:00 0.4 mg
  Filled 2019-04-21: qty 1

## 2019-04-21 NOTE — Progress Notes (Signed)
Pharmacy Antibiotic Note  Chad Mcclure is a 63 y.o. male transferred to [facility] on 03/31/2019 with fever, respiratory failure and seizure.  Patient completed a course of antibiotics for MRSA PNA.  Now with increased WOB and desaturation and Pharmacy has been consulted for vancomycin and zosyn dosing for PNA.  Patient had dialysis earlier this admission, but renal function has recovered and dialysis access was removed.  SCr currently at 1.81, CrCL 46 ml/min, afebrile, WBC WNL.  Will not load vancomycin given recent dialysis need.  Plan: Vanc 1250mg  IV Q24H for AUC 481 using SCr 1.81 Zosyn EID 3.375gm IV Q8H Monitor renal fxn, clinical progress, vanc AUC as indicated  Height: 6' (182.9 cm) Weight: 189 lb 6 oz (85.9 kg) IBW/kg (Calculated) : 77.6  Temp (24hrs), Avg:98.1 F (36.7 C), Min:97.6 F (36.4 C), Max:98.5 F (36.9 C)  Recent Labs  Lab 04/17/19 0346 04/18/19 0821 04/19/19 0450 04/21/19 0551  WBC 9.6  --  11.6* 9.3  CREATININE 1.87* 1.70* 1.72* 1.81*    Estimated Creatinine Clearance: 45.9 mL/min (A) (by C-G formula based on SCr of 1.81 mg/dL (H)).    Not on File  [place] 9/3 >> 9/5 Vanc 9/3 > 9/9, restarted 9/23 >> Zosyn 9/23 >>  9/5 VR =20 mg/dl - per nephrology no dialysis today. 9/6 VR = 16 mg/dl >> gave Vanc 1500mg  9/8 VR = 21 9/9 VR = 13 >> gave Vanc 500mg   9/2 MRSA PCR - positive 9/3 TA - MRSA 9/21 covid - negative  [name] cultures: 9/2 sputum: moderate staph aureus - MRSA  Chad Egloff D. Mina Mcclure, PharmD, BCPS, [place] 04/21/2019, 1:07 PM

## 2019-04-21 NOTE — Progress Notes (Signed)
SLP Cancellation Note  Patient Details Name: Chad Mcclure MRN: [id number] DOB: Dec 19, 1955   Cancelled treatment:       Reason Eval/Treat Not Completed: Pt is currently on a non-rebreather and is not appropriate for speech therapy treatment at this time.  ST will continue to follow up as appropriate.    Bretta Bang, M.S., [place] [facility] Office: [phone]  [place] 04/21/2019, 2:53 PM

## 2019-04-21 NOTE — Progress Notes (Signed)
Called by RN re: ??? WOB and desat depsite increse FiO2 from 5-15 NRB ABG reflects resp alkalosis rec'd IV morphine for PEG tube pain--rec'd Narca 4 x 2 with minimal effect Will tx to ICU status under CCM medicine  Attempted to call 3 family member numbers in chart--no answer Greatly appreciate cares from RN and CCM   Will follow peripherally--if somehow miraculously awakens in am, can re-assume care  Verneita Griffes, MD [facility] 12:29 PM

## 2019-04-21 NOTE — Progress Notes (Signed)
LE venous duplex       has been completed. Preliminary results can be found under CV proc through chart review. Chyenne Sobczak, BS, RDMS, RVT   

## 2019-04-21 NOTE — Progress Notes (Signed)
OT NOTE  Ot to defer treatment at this time due to move to ICU and await for more appropriate time for OTR/L to follow up for reevaluation needs   Jeri Modena, OTR/L  Acute Rehabilitation Services Pager: [phone] Office: [phone] .

## 2019-04-21 NOTE — Significant Event (Addendum)
Rapid Response Event Note  Overview: Respiratory - Hypoxia  Initial Focused Assessment: Received a call from nurse with concerns of patient being hypoxic. Per nurse, patient was on 5L [place] this morning, at times he would take his oxygen off and drop his oxygen saturation but once the oxygen was placed on back, his saturations would quickly recuperate. This time per nurse, saturations dropped into the 80s while the oxygen was on, nurse escalated the oxygen up to NRB 15L 100%, and after several minutes, saturations improved to > 95%.   Upon arrival, saturations were 96% on 15L NRB, RR 12-14, shallow breathing, lung sounds clear in the upper fields, diminished in the lower fields. Oral secretions present - patient was suctioned and NTS, small amount of thin clear secretions present. HR 100-113, stable BP per nurse. Neuro: slow to respond, quite lethargic, per nurse, patient is more sleepy now than before, + CR/Gag/Cough when elicited. Received Morphine 2mg  IV at 1048, received Ativan overnight. LT arm contracted s/p previously CVA, abdomen - soft, + PEG - on TF, which are now stopped.   TRH MD came to bedside quickly and PCCM NP came as well   Interventions: -- STAT CXR -- STAT ABG -- Narcan 0.4 mg IV x 2 - RR improved after both doses,  Patient did arouse more after second dose, mildly agitated. -- PIV - 20 G RAC (+BR)  -- Narcan Drip started.   Plan of Care: -- Transfer to ICU per PCCM   Event Summary:  Call Time 1143 Arrival Time 1145 End Time Pending     Chad Mcclure R

## 2019-04-21 NOTE — TOC Transition Note (Signed)
Transition of Care [facility]) - CM/SW Discharge Note   Patient Details  Name: Chad Mcclure MRN: [id number] Date of Birth: 17-Nov-1955  [facility] CM/SW Contact:  Eileen Stanford, LCSW Phone Number: 04/21/2019, 11:50 AM   Clinical Narrative:   Clinical Social Worker facilitated patient discharge including contacting patient family and facility to confirm patient discharge plans.  Clinical information faxed to facility and family agreeable with plan.  CSW arranged ambulance transport via [facility] to [place].  RN to call [phone] for report prior to discharge.   Final next level of care: Skilled Nursing Facility Barriers to Discharge: No Barriers Identified   Patient Goals and CMS Choice Patient states their goals for this hospitalization and ongoing recovery are:: return [facility] to [facility]      Discharge Placement              Patient chooses bed at: [facility] Patient to be transferred to facility by: [place] Name of family member notified: Jason-son Patient and family notified of of transfer: 04/21/19  Discharge Plan and Services                                     Social Determinants of Health (SDOH) Interventions     Readmission Risk Interventions No flowsheet data found.

## 2019-04-21 NOTE — Progress Notes (Signed)
Referring Physician(s): Dr. Sloan Leiter  Supervising Physician: Sandi Mariscal  Patient Status:  [facility] - In-pt  Chief Complaint: Dysphagia  Subjective: Somnolent during visit.  RN and CCM at bedside. H/o status epilepticus.  Not responsive during assessment.  G-tube in place.  Free water bolus infusing.   Allergies: Patient has no allergy information on record.  Medications: Prior to Admission medications   Medication Sig Start Date End Date Taking? Authorizing Provider  acetaminophen (TYLENOL) 325 MG suppository Place 650 mg rectally every 6 (six) hours as needed for mild pain or fever.    [provider]  amLODipine (NORVASC) 10 MG tablet Take 10 mg by mouth daily.    [provider]  aspirin EC 81 MG tablet Take 81 mg by mouth daily.    [provider]  Cholecalciferol (VITAMIN D) 50 MCG (2000 UT) CAPS Take 2,000 Units by mouth daily.     [provider]  dorzolamide-timolol (COSOPT) 22.3-6.8 MG/ML ophthalmic solution     [provider]  hydrALAZINE (APRESOLINE) 25 MG tablet Take 25 mg by mouth daily.    [provider]  Lacosamide 100 MG TABS Take 1 tablet (100 mg total) by mouth 2 (two) times daily. 04/21/19   Nita Sells, MD  levETIRAcetam (KEPPRA) 500 MG tablet Take 1 tablet (500 mg total) by mouth 2 (two) times daily. 04/21/19   Nita Sells, MD  lisinopril (ZESTRIL) 10 MG tablet Take 10 mg by mouth daily.    [provider]  LORazepam (ATIVAN) 2 MG/ML injection Inject 0.5-2 mLs (1-4 mg total) into the vein every 2 (two) hours as needed for anxiety. 04/21/19   Nita Sells, MD  metFORMIN (GLUCOPHAGE) 1000 MG tablet Take 1,000 mg by mouth 2 (two) times daily with a meal.    [provider]  metoprolol tartrate (LOPRESSOR) 25 MG tablet Place 0.5 tablets (12.5 mg total) into feeding tube 2 (two) times daily. 04/21/19   Nita Sells, MD  Nutritional Supplements (FEEDING SUPPLEMENT,  OSMOLITE 1.5 CAL,) LIQD Place 1,000 mLs into feeding tube continuous. 04/21/19   Nita Sells, MD  omeprazole (PRILOSEC) 20 MG capsule Take 20 mg by mouth daily.    [provider]  pantoprazole sodium (PROTONIX) 40 mg/20 mL PACK Place 20 mLs (40 mg total) into feeding tube daily. 04/22/19   Nita Sells, MD  travoprost, benzalkonium, (TRAVATAN) 0.004 % ophthalmic solution Place 1 drop into both eyes at bedtime.    [provider]  traZODone (DESYREL) 50 MG tablet Take 0.5 tablets (25 mg total) by mouth at bedtime. 04/21/19   Nita Sells, MD  vitamin C (ASCORBIC ACID) 250 MG tablet Take 250 mg by mouth 2 (two) times daily.    [provider]  Water For Irrigation, Sterile (FREE WATER) SOLN Place 400 mLs into feeding tube every 3 (three) hours. 04/21/19   Nita Sells, MD  zinc sulfate 220 (50 Zn) MG capsule Take 220 mg by mouth daily.    [provider]     Vital Signs: BP (!) 143/84 (BP Location: Left Arm)   Pulse (!) 110   Temp 97.9 F (36.6 C) (Oral)   Resp (!) 28   Ht 6' (1.829 m)   Wt 189 lb 6 oz (85.9 kg)   SpO2 94%   BMI 25.68 kg/m   Physical Exam Vitals signs and nursing note reviewed.  Constitutional:      General: He is not in acute distress.    Appearance: He is ill-appearing.  Cardiovascular:     Rate and Rhythm: Normal rate and regular rhythm.  Pulmonary:     Effort: No respiratory distress.     Breath sounds: Normal breath sounds.     Comments: On  Non-rebreather Abdominal:     General: Abdomen is flat.     Palpations: Abdomen is soft.     Comments: Gastrostomy tube in place.  Insertion site c/d/i.  Bolus infusing.   Skin:    General: Skin is warm and dry.  Neurological:     General: No focal deficit present.  Psychiatric:        Mood and Affect: Mood normal.        Behavior: Behavior normal.        Thought Content: Thought content normal.        Judgment: Judgment normal.     Imaging: Dg  Abd 1 View  Result Date: 04/20/2019 CLINICAL DATA:  Preop gastrostomy placement. EXAM: ABDOMEN - 1 VIEW COMPARISON:  Radiograph of April 09, 2019. FINDINGS: No abnormal bowel dilatation is noted. Residual contrast is seen throughout the nondilated colon. Distal tip of Dobbhoff tube is seen in expected position of distal stomach. No radio-opaque calculi or other significant radiographic abnormality are seen. IMPRESSION: No evidence of bowel obstruction or ileus. Residual contrast remains throughout nondilated colon. Electronically Signed   By: Lupita Raider M.D.   On: 04/20/2019 09:17   Ir Gastrostomy Tube Mod Sed  Result Date: 04/20/2019 INDICATION: Dysphagia. Please place percutaneous gastrostomy tube for enteric nutrition supplementation purposes. EXAM: PULL TROUGH GASTROSTOMY TUBE PLACEMENT COMPARISON:  None. MEDICATIONS: Ancef 2 gm IV; Antibiotics were administered within 1 hour of the procedure. Glucagon 1 mg IV CONTRAST:  30 mL of Omnipaque 300 administered into the gastric lumen. ANESTHESIA/SEDATION: Moderate (conscious) sedation was employed during this procedure. A total of Versed 0.5 mg and Fentanyl 50 mcg was administered intravenously. Moderate Sedation Time: 10 minutes. The patient's level of consciousness and vital signs were monitored continuously by radiology nursing throughout the procedure under my direct supervision. FLUOROSCOPY TIME:  3 minutes, 48 seconds (35 mGy) COMPLICATIONS: None immediate. PROCEDURE: Informed written consent was obtained from the patient's family following explanation of the procedure, risks, benefits and alternatives. A time out was performed prior to the initiation of the procedure. Ultrasound scanning was performed to demarcate the edge of the left lobe of the liver. Maximal barrier sterile technique utilized including caps, mask, sterile gowns, sterile gloves, large sterile drape, hand hygiene and Betadine prep. The left upper quadrant was sterilely prepped  and draped. An oral gastric catheter was inserted into the stomach under fluoroscopy. The existing nasogastric feeding tube was removed. The left costal margin and barium/air opacified transverse colon were identified and avoided. Air was injected into the stomach for insufflation and visualization under fluoroscopy. Under sterile conditions a 17 gauge trocar needle was utilized to access the stomach percutaneously beneath the left subcostal margin after the overlying soft tissues were anesthetized with 1% Lidocaine with epinephrine. Needle position was confirmed within the stomach with aspiration of air and injection of small amount of contrast. A single T tack was deployed for gastropexy. Over an Amplatz guide wire, a 9-French sheath was inserted into the stomach. A snare device was utilized to capture the oral gastric catheter. The snare device was pulled retrograde from the stomach up the esophagus and out the oropharynx. The 20-French pull-through gastrostomy was connected to the snare device and pulled antegrade through the oropharynx down the esophagus into  the stomach and then through the percutaneous tract external to the patient. The gastrostomy was assembled externally. Contrast injection confirms position in the stomach. Several spot radiographic images were obtained in various obliquities for documentation. The patient tolerated procedure well without immediate post procedural complication. FINDINGS: After successful fluoroscopic guided placement, the gastrostomy tube is appropriately positioned with internal disc against the ventral aspect of the gastric lumen. IMPRESSION: Successful fluoroscopic insertion of a 20-French pull-through gastrostomy tube. The gastrostomy may be used immediately for medication administration and in 24 hrs for the initiation of feeds. Electronically Signed   By: Simonne Come M.D.   On: 04/20/2019 09:42   Dg Chest Port 1 View  Result Date: 04/21/2019 CLINICAL DATA:   Pneumonia.  Cough. EXAM: PORTABLE CHEST 1 VIEW COMPARISON:  04/08/2019. FINDINGS: Mediastinum hilar structures normal. Heart size stable. Patchy bilateral pulmonary infiltrates are again noted. Similar findings noted on prior exam. No pleural effusion or pneumothorax. Degenerative change thoracic spine. Contrast noted in the left upper quadrant. IMPRESSION: Patchy bilateral pulmonary infiltrates again noted without significant interim change. Electronically Signed   By: Maisie Fus  Register   On: 04/21/2019 12:18    Labs:  CBC: Recent Labs    04/13/19 0325 04/17/19 0346 04/19/19 0450 04/21/19 0551  WBC 9.4 9.6 11.6* 9.3  HGB 12.1* 11.1* 10.9* 12.1*  HCT 36.4* 34.5* 31.9* 36.5*  PLT 249 262 313 346    COAGS: Recent Labs    04/19/19 0450  INR 1.1    BMP: Recent Labs    04/17/19 0346 04/18/19 0821 04/19/19 0450 04/21/19 0551  NA 140 135 136 138  K 4.0 3.9 4.0 4.5  CL 106 106 105 107  CO2 21* 21* 21* 26  GLUCOSE 152* 131* 104* 220*  BUN 27* 25* 24* 26*  CALCIUM 8.4* 8.1* 8.4* 8.4*  CREATININE 1.87* 1.70* 1.72* 1.81*  GFRNONAA 37* 42* 41* 39*  GFRAA 43* 49* 48* 45*    LIVER FUNCTION TESTS: Recent Labs    03/31/19 2317 04/02/19 0634 04/04/19 0452 04/06/19 2140  BILITOT 0.8  --   --   --   AST 63*  --   --   --   ALT 81*  --   --   --   ALKPHOS 52  --   --   --   PROT 6.7  --   --   --   ALBUMIN 1.9* 1.9* 1.9* 2.0*    Assessment and Plan: Dysphagia Patient s/p gastrostomy tube placement yesterday in IR.  With desaturation event this AM.  On non-re breather at time of visit.  G-tube intact.  No issues with tube.  Recommend holding infusion of free water until stable and CXR obtained. G-tube ready for use per primary team.  Of note, patient was planning for discharge today, now to transfer to ICU.    Electronically Signed: Hoyt Koch, PA 04/21/2019, 2:01 PM   I spent a total of 15 Minutes at the the patient's bedside AND on the patient's  hospital floor or unit, greater than 50% of which was counseling/coordinating care for dysphagia.

## 2019-04-21 NOTE — Discharge Summary (Addendum)
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 Nasopharyngeal Nasopharyngeal Swab     Status: None   Collection Time: 04/19/19 11:05 AM   Specimen: Nasopharyngeal Swab  Result Value Ref Range Status   SARS Coronavirus 2 NEGATIVE NEGATIVE Final    Comment: (NOTE) If result is NEGATIVE SARS-CoV-2 target nucleic acids are NOT DETECTED. The SARS-CoV-2 RNA is generally detectable in upper and lower  respiratory specimens during the acute phase of infection. The lowest  concentration of SARS-CoV-2 viral copies this assay can detect is 250  copies / mL. A negative result does not preclude SARS-CoV-2 infection  and should not be used as the sole basis for treatment or other  patient management decisions.  A negative result may occur with  improper specimen collection / handling, submission of specimen other  than nasopharyngeal swab, presence of viral mutation(s) within the  areas targeted by this assay, and inadequate number of viral copies  (<250 copies / mL). A negative result must be combined with clinical   observations, patient history, and epidemiological information. If result is POSITIVE SARS-CoV-2 target nucleic acids are DETECTED. The SARS-CoV-2 RNA is generally detectable in upper and lower  respiratory specimens dur ing the acute phase of infection.  Positive  results are indicative of active infection with SARS-CoV-2.  Clinical  correlation with patient history and other diagnostic information is  necessary to determine patient infection status.  Positive results do  not rule out bacterial infection or co-infection with other viruses. If result is PRESUMPTIVE POSTIVE SARS-CoV-2 nucleic acids MAY BE PRESENT.   A presumptive positive result was obtained on the submitted specimen  and confirmed on repeat testing.  While 2019 novel coronavirus  (SARS-CoV-2) nucleic acids may be present in the submitted sample  additional confirmatory testing may be necessary for epidemiological  and / or clinical management purposes  to differentiate between  SARS-CoV-2 and other Sarbecovirus currently known to infect humans.  If clinically indicated additional testing with an alternate test  methodology [phone]) is advised. The SARS-CoV-2 RNA is generally  detectable in upper and lower respiratory sp ecimens during the acute  phase of infection. The expected result is Negative. Fact Sheet for Patients:  BoilerBrush.com.cy Fact Sheet for Healthcare Providers: https://pope.com/ This test is not yet approved or cleared by the [place] FDA and has been authorized for detection and/or diagnosis of SARS-CoV-2 by FDA under an Emergency Use Authorization (EUA).  This EUA will remain in effect (meaning this test can  be used) for the duration of the COVID-19 declaration under Section 564(b)(1) of the Act, 21 U.S.C. section 360bbb-3(b)(1), unless the authorization is terminated or revoked sooner. Performed at [facility], [place] [address]  [place], [place] [address]      Labs: Basic Metabolic Panel: Recent Labs  Lab 04/17/19 0346 04/18/19 0821 04/19/19 0450 04/21/19 0551  NA 140 135 136 138  K 4.0 3.9 4.0 4.5  CL 106 106 105 107  CO2 21* 21* 21* 26  GLUCOSE 152* 131* 104* 220*  BUN 27* 25* 24* 26*  CREATININE 1.87* 1.70* 1.72* 1.81*  CALCIUM 8.4* 8.1* 8.4* 8.4*  MG  --   --   --  1.9  PHOS  --   --   --  4.5   Liver Function Tests: No results for input(s): AST, ALT, ALKPHOS, BILITOT, PROT, ALBUMIN in the last 168 hours. No results for input(s): LIPASE, AMYLASE in the last 168 hours. No results for input(s): AMMONIA in the last 168 hours. CBC: Recent Labs  Lab 04/17/19 0346 04/19/19 0450 04/21/19 0551  WBC 9.6 11.6* 9.3  NEUTROABS 5.8  --  5.8  HGB 11.1* 10.9* 12.1*  HCT 34.5* 31.9* 36.5*  MCV 84.4 81.6 83.5  PLT 262 313 346   Cardiac Enzymes: No results for input(s): CKTOTAL, CKMB, CKMBINDEX, TROPONINI in the last 168 hours. BNP: BNP (last 3 results) No results for input(s): BNP in the last 8760 hours.  ProBNP (last 3 results) No results for input(s): PROBNP in the last 8760 hours.  CBG: Recent Labs  Lab 04/20/19 1717 04/20/19 2031 04/21/19 0000 04/21/19 0404 04/21/19 0821  GLUCAP 95 187* 165* 188* 186*       Signed:  Nita Sells MD   [facility] 04/21/2019, 10:14 AM  \

## 2019-04-21 NOTE — Progress Notes (Signed)
NAME:  Chad Mcclure, MRN:  [id number], DOB:  Jul 09, 1956, LOS: 21 ADMISSION DATE:  03/31/2019, CONSULTATION DATE:  03/31/19 REFERRING MD:  [facility]  CHIEF COMPLAINT:  Status epilepticus   Brief History   Chad Mcclure is a 63 y.o. male who was initially admitted to [name] 8/20 with fevers felt to be due to PNA and cystitis. Required intubation 8/21 for hypoxic respiratory failure.  Extubated 8/29 then required re-intubation 8/31 for status epilepticus. Transferred to [facility] 9/2.   Past Medical History  HTN, HLD, HCV, Left CVA, DM, schizophrenia, dementia.  Significant Hospital Events   8/20 > admit. 8/21 > intubation. 8/24 > HD started. 8/29 > extubated. 8/31 > seizures / status. 9/2 > transferred to [facility]. 9/4 >Tolerated PS. No extubation d/t mental status 9/5 >Self-extubated and remained stable on [place] 9/6 > Monitored respirator status 9/7 >Tolerating 3L O2 9/8 to triad, cr improving, requiring free water replacement w/ solute diuresis associated w/ renal recovery  9/10 remaining encephalopathic. Completing vanc for MRSA PNA 9/14 HD cath removed.  9/19 family decided on PEG. Wanted aggressive care after several PMT visits.  9/22 PEG placed.  9/23 was to be discharged to SNF. Had received morphine for post-peg pain. Found Hypoxic w/ sats in 70s on 4 liters. Placed on NRB. Narcan given w/ improvement in mental status. CXR w/ possible worsening left base. Narcan gtt started, placed on empiric nosocomial coverage and transferred to ICU  Consults:  Neurology. Nephrology.  Procedures:  ETT 8/21 > 8/29.  8/31 > 9/5 R [place] HD 8/24 > out  L IJ CVL 8/22 > out   Significant Diagnostic Tests:  EEG 9/1 > neg. CT head 9/2 >  EEG 9/3 > neg for sz MRI 9/3 > chronic microvascular changes, remote lacunar infarcts  Micro Data:  SARS CoV2 8/20 > neg. Trach Asp 9/3 > MRSA  Antimicrobials:  Zosyn 9/2  Meropenum 9/3 Vanc 9/3>9/9 vanc 9/23>>> Zosyn 9/23>>  Interim  history/subjective:   Seems to be responding to Narcan  Objective:  Blood pressure (Abnormal) 137/92, pulse (Abnormal) 110, temperature 98.5 F (36.9 C), temperature source Oral, resp. rate 20, height 6' (1.829 m), weight 85.9 kg, SpO2 93 %.        Intake/Output Summary (Last 24 hours) at 04/21/2019 1245 Last data filed at 04/21/2019 0331 Gross per 24 hour  Intake no documentation  Output 800 ml  Net -800 ml   Filed Weights   04/16/19 0500 04/18/19 0429 04/19/19 0415  Weight: 85 kg 85.6 kg 85.9 kg   Physical Exam: General: 63 year old black male, lethargic, moans, does not make attempts to verbalize this is incomprehensible HEENT normocephalic mucous membranes are dry neck veins flat Pulmonary: Diminished left base, scattered rhonchi respiratory rate is regular and seemingly depressed respiratory rate at times Cardiac: Regular rate and rhythm Abdomen: Soft nontender no organomegaly Extremities: Warm and dry dependent edema Neuro: Opens eyes, attempts to verbalize, the left hand is in a Occupational Therapy brace, continues to have left-sided hemiparesis moves spontaneously on the right  Resolved problem list   MRSA pneumonia Assessment & Plan:   Acute hypoxic respiratory failure.  Presumed secondary to atelectasis and narcotics -Portable chest x-ray personally reviewed this demonstrates fairly  stable chest x-ray with possible slight worsening of left base.   -New oxygen requirement seems somewhat out of proportion to chest x-ray changes.  He had been receiving subcutaneous heparin.  Plan Initiating Narcan drip Continue supplemental oxygen Transfer to intensive care Pulse oximetry  Procalcitonin Initiate vancomycin and Zosyn If no improvement in the next couple hours will consider CT imaging of chest to rule out thromboembolic event A.m. chest x-ray  Acute toxic/metabolic encephalopathy.  Acutely decompensated likely secondary to narcotics but complicated further by  recent critical illness, renal dysfunction, and seizure all superimposed on underlying schizophrenia and dementia Plan Narcan infusion as above discontinue all sedating medications Continue anticonvulsants which will include Vimpat and Keppra Serial neuro checks Check ammonia level   AKI secondary contrast nephropathy - started on HD on 8/24 (TTS schedule).  Dialysis has since been discontinued with HD catheter being removed on 9/5 -Serum creatinine seems to be holding around 1.7 to 1.8 Plan Avoid hypotension Renal adjust medications Strict intake and output A.m. chemistry  At risk for fluid and electrolyte imbalance Plan Trend daily chemistries  Protein calorie malnutrition: Critical illness complicated by dysphasia, now status post PEG placement 9/22 Plan N.p.o. Medications and diet via PEG  Hypertension Plan Continue amlodipine,And Lopressor.   Hx DM. Plan Sliding scale insulin  Best Practice:  Diet: TF, FWF Pain/Anxiety/Delirium protocol (if indicated): - VAP protocol (if indicated): - DVT prophylaxis: SCD's / Heparin. GI prophylaxis: PPI. Glucose control: SSI. Mobility: Bedrest. Code Status: Full. Family Communication: Updated by triad Disposition: Moving to intensive care.  Initiating Narcan drip.  Starting empiric antibiotics.  Will need to follow closely, high risk for intubation.  If hypoxia persists will need to consider CT imaging  Critical care time 33 minutes  Simonne Martinet ACNP-BC [facility] Pulmonary/Critical Care Pager # [phone] OR # [phone] if no answer

## 2019-04-21 NOTE — Progress Notes (Signed)
Per Dr. Truman Hayward, due to patient already having MRSA in lungs, a PCR does not need to be done.

## 2019-04-21 NOTE — Plan of Care (Signed)
  Problem: Elimination: Goal: Will not experience complications related to urinary retention Outcome: Progressing   Problem: Nutrition: Goal: Adequate nutrition will be maintained Outcome: Not Progressing Note: Pt transferred down to ICU as a rapid response for respiratory distress and possible aspiration. Tube feedings have stopped at this time.

## 2019-04-22 ENCOUNTER — Inpatient Hospital Stay (HOSPITAL_COMMUNITY): Payer: Medicaid Other

## 2019-04-22 LAB — BASIC METABOLIC PANEL
Anion gap: 12 (ref 5–15)
BUN: 24 mg/dL — ABNORMAL HIGH (ref 8–23)
CO2: 21 mmol/L — ABNORMAL LOW (ref 22–32)
Calcium: 8.6 mg/dL — ABNORMAL LOW (ref 8.9–10.3)
Chloride: 110 mmol/L (ref 98–111)
Creatinine, Ser: 1.84 mg/dL — ABNORMAL HIGH (ref 0.61–1.24)
GFR calc Af Amer: 44 mL/min — ABNORMAL LOW (ref >60)
GFR calc non Af Amer: 38 mL/min — ABNORMAL LOW (ref >60)
Glucose, Bld: 132 mg/dL — ABNORMAL HIGH (ref 70–99)
Potassium: 4.5 mmol/L (ref 3.5–5.1)
Sodium: 143 mmol/L (ref 135–145)

## 2019-04-22 LAB — GLUCOSE, CAPILLARY
Glucose-Capillary: 125 mg/dL — ABNORMAL HIGH (ref 70–99)
Glucose-Capillary: 106 mg/dL — ABNORMAL HIGH (ref 70–99)
Glucose-Capillary: 122 mg/dL — ABNORMAL HIGH (ref 70–99)
Glucose-Capillary: 140 mg/dL — ABNORMAL HIGH (ref 70–99)
Glucose-Capillary: 122 mg/dL — ABNORMAL HIGH (ref 70–99)

## 2019-04-22 LAB — CBC
HCT: 37.3 % — ABNORMAL LOW (ref 39.0–52.0)
Hemoglobin: 12.6 g/dL — ABNORMAL LOW (ref 13.0–17.0)
MCH: 27.8 pg (ref 26.0–34.0)
MCHC: 33.8 g/dL (ref 30.0–36.0)
MCV: 82.2 fL (ref 80.0–100.0)
Platelets: 353 10*3/uL (ref 150–400)
RBC: 4.54 MIL/uL (ref 4.22–5.81)
RDW: 15.9 % — ABNORMAL HIGH (ref 11.5–15.5)
WBC: 11.3 10*3/uL — ABNORMAL HIGH (ref 4.0–10.5)
nRBC: 0 % (ref 0.0–0.2)

## 2019-04-22 LAB — MAGNESIUM: Magnesium: 1.7 mg/dL (ref 1.7–2.4)

## 2019-04-22 LAB — PHOSPHORUS: Phosphorus: 4.2 mg/dL (ref 2.5–4.6)

## 2019-04-22 MED ORDER — AMOXICILLIN-POT CLAVULANATE 875-125 MG PO TABS
1.0000 | ORAL_TABLET | Freq: Two times a day (BID) | ORAL | Status: DC
  Administered 2019-04-22 – 2019-04-24 (×5): 1 via ORAL
  Filled 2019-04-22 (×5): qty 1

## 2019-04-22 MED ORDER — PRO-STAT SUGAR FREE PO LIQD
30.0000 mL | Freq: Two times a day (BID) | ORAL | Status: DC
  Administered 2019-04-22 – 2019-04-24 (×5): 30 mL
  Filled 2019-04-22 (×5): qty 30

## 2019-04-22 MED ORDER — OSMOLITE 1.5 CAL PO LIQD
1000.0000 mL | ORAL | Status: DC
  Administered 2019-04-22 – 2019-04-23 (×3): 1000 mL
  Filled 2019-04-22 (×4): qty 1000

## 2019-04-22 MED ORDER — FREE WATER
200.0000 mL | Status: DC
  Administered 2019-04-22 – 2019-04-24 (×12): 200 mL

## 2019-04-22 NOTE — Progress Notes (Signed)
  Speech Language Pathology Treatment: Dysphagia;Cognitive-Linquistic  Patient Details Name: MARKIES MOWATT MRN: [id number] DOB: November 11, 1955 Today's Date: 04/22/2019 Time: 9163-8466 SLP Time Calculation (min) (ACUTE ONLY): 23 min  Assessment / Plan / Recommendation Clinical Impression  Pt seen at bedside for skilled ST intervention targeting goals for swallow function/po readiness, effective communication, and basic cognition. RN assisted with repositioning pt. Pt tolerated oral care with suction. Following oral care, pt accepted trials of ice chips x2 and sips of thin liquid via straw. Pt exhibited appropriate oral maniuplation of ice chips with adequate laryngeal elevation per palpation. No overt s/s aspiration following ice chips. Pt was then given sips of water via straw and exhibited cough response after trials. Minimal vocalizations elicited during this session. Pt did not follow verbal commands or open his eyes despite max multimodal cues. Pt now has PEG tube, however, ice chips after oral care are recommended for comfort. Will continue ST intervention for com/cog treatment per plan of care    HPI HPI: DAEGEN BERROCAL is a 63 y.o. 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 Admitted to [facility] on 03/18/19 with fever due PNA, required intubated on 8/21 for hypoxic respiratory failure, extubated 8/29 then required re-intubation 8/31 for staus epilepticus then transferred to [facility] 9/2 and self extubated 9/5.       SLP Plan  Continue with current plan of care;Goals updated       Recommendations  Diet recommendations: NPO Medication Administration: Via alternative means                Oral Care Recommendations: Oral care QID Follow up Recommendations: Skilled Nursing facility;24 hour supervision/assistance SLP  Visit Diagnosis: Dysphagia, unspecified (R13.10) Plan: Continue with current plan of care;Goals updated       [place] B. Quentin [facility], CCC-SLP Speech Language Pathologist [phone]  Shonna Chock 04/22/2019, 11:21 AM

## 2019-04-22 NOTE — Progress Notes (Signed)
Nutrition Follow-up  DOCUMENTATION CODES:   Not applicable  INTERVENTION:   Resume TF via PEG:   Osmolite 1.5 at 76ml/hr, increase by 10 ml every 8hours to goal rate of 36ml/hr.    Pro-stat 63ml BID per tube.    Free water flushes 200 ml every 4 hours.   Provides 2180kcal (100% of needs),113grams of protein, 2203 ml free water daily.  NUTRITION DIAGNOSIS:   Inadequate oral intake related to inability to eat as evidenced by NPO status.  Ongoing   GOAL:   Patient will meet greater than or equal to 90% of their needs  Progressing   MONITOR:   TF tolerance, Weight trends, Labs, I & O's, Skin  ASSESSMENT:   63 year old male who presented on 9/02 from [place] (admitted 8/20) with fevers suspected to be related to sepsis secondary to pneumonia and cystitis. Pt was intubated 8/21 and failed extubation after gross aspiration and status epilepticus which prompted transfer to [facility]. PMH of HTN, HLD, left CVA, DM, hepatitis C, schizophrenia, dementia.  Discussed patient in ICU rounds and with RN today. Patient transferred to the ICU 9/23 with acute on chronic hypoxic respiratory failure. Required NRB overnight. Now on 5L HFNC. Plans to transfer to progressive care unit today. Per discussion with MD, RD to restart TF and titrate slowly to goal rate.  Labs reviewed.  CBG's: 125-106  Medications reviewed and include novolog and senokot.  Weight stable over the past week. I/O -20 L since admission  Diet Order:   Diet Order            Diet - low sodium heart healthy        Diet NPO time specified  Diet effective now              EDUCATION NEEDS:   No education needs have been identified at this time  Skin:  Skin Assessment: Reviewed RN Assessment  Last BM:  9/21  Height:   Ht Readings from Last 1 Encounters:  03/31/19 6' (1.829 m)    Weight:   Wt Readings from Last 1 Encounters:  04/22/19 85.7 kg    Ideal Body Weight:  80.9 kg  BMI:   Body mass index is 25.62 kg/m.  Estimated Nutritional Needs:   Kcal:  2100-2300  Protein:  110-125 grams  Fluid:  >/= 2  L/day    Molli Barrows, RD, LDN, [place] Pager [phone] After Hours Pager [phone]

## 2019-04-22 NOTE — Progress Notes (Signed)
Checked on patient Discussed with his nurse  Off Narcan since about 5 PM last evening  Awake, interactive, slow  On oxygen supplementation  We will transfer back out to medical floor, progressive care  Dr. Verlon Au will follow

## 2019-04-22 NOTE — Progress Notes (Signed)
CSW was made aware in rounds that the patient was currently in mittens, CSW and RN CM notified Dr. Verlon Au that the patient must be out of restraints for at least 24 hours prior to discharge.  Madilyn Fireman, MSW, LCSW-A Clinical Social Worker   Transitions of [facility]   Medical ICU [phone]

## 2019-04-22 NOTE — Discharge Summary (Signed)
PROGRESS NOTE  Chad Mcclure PJA:[id number] DOB: Oct 10, 1955 DOA: 03/31/2019 PCP: Patient, No Pcp Per  Brief History   24 prior community dwelling black male HTN HLD HCV?  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 admitted from [facility] 8/20 fevers suspect related to sepsis/cystitis-intubated 8/21-reintubated 8/31 for status epilepticus failed extubation 9/2 and was transferred to [facility]- Initially required TTS dialysis right subclavian permacath-transferred to hospitalist 04/06/2019-Rx MRSA pneumonia seizure persisting encephalopathy Came off dialysis IR PEG placement 9/22-palliative care discussion performed family wished everything to be done  A & P  Acute metabolic encephalopathy EEG 9/3 severe encephalopathy no seizures-continue Keppra, Vimpat, other meds prolonged encephalopathic state 2/2 prolonged seizures/status-therapy to see in the outpatient setting Would continue aspirin for secondary stroke prevention as he is high risk  Acute hypoxic respiratory failure, MRSA pneumonia Decompensated 9/23 after patient actually had been discharged back to skilled facility secondary to iatrogenic morphine given versus aspiration-received Narcan multiple rounds and was transferred to the ICU for Narcan GTT and is now off of the same Respiratory failure is improved marginally only he is down from 15 to 5 L but is slightly hypoxic he will need to be monitored overnight especially given his x-ray findings below Rx MRSA completed course of vancomycin this admission Will need Unasyn for the next 5 days transition to Augmentin 9/24 and date antibiotics 9/29 If continues to aspirate and spiked fevers-would recommend goals of care reconsidered outpatient  AKI 2/2 contrast nephropathy, nephropathy, hypernatremia from free water deficit Dialysis started in [place] on 8/24 nephrology saw the patient had a PermCath which was removed renal function stabilized  during hospital stay  DM TY 2, dysphagia secondary to massive stroke in the past and encephalopathy with no ability to feed himself IR PEG placement 9/22 feeding protocol as per dietician/free water q 3 Nutritionist consulted to resume feeds at a hopefully slower rate given possible aspiration on 9/23-a.m. CXR rule out worsening or progression of PNA SSI as needed for diabetes as an outpatient?-we will need to continue metformin and adjust this according to labs and glycemic control in the outpatient setting  HTN-continue amlodipine, metoprolol 12.5 twice daily, hydralazine 25 daily and titrate meds if blood pressure is high             NO Lisinopril given prerenal azotemia this admission  DVT prophylaxis: lovenox Code Status: Full Family Communication: spoke with Mother in law Disposition Plan: transfer to Progressive    Verneita Griffes, MD [facility] 11:32 AM  04/22/2019, 11:32 AM  LOS: 22 days   Consultants  . pccm  Procedures  . multiple3  Antibiotics  .  unasyn-->augmentin  Interval History/Subjective  Awake alert more coherent and congnisant Moving 4 limbs somewhat agitated at times  Objective   Vitals:  Vitals:   04/22/19 0500 04/22/19 1010  BP: 118/80 120/75  Pulse: (!) 117   Resp: (!) 22   Temp:    SpO2: 96%     Exam:  Awake alert coherent no distress EOMI tracks fairly well however does have left hemineglect to some degree Patient has a mitten on the right hand mucosa is dry Patient has a splint on left wrist Chest is clinically clear with no added sound rales rhonchi Sinus rhythm on exam PEG tube in place site looks clean abdomen slightly distended no rebound No lower extremity edema Integumentary not examined Neurologically intact however dense deficit on left side mainly in the left upper extremity Cannot assess  psych  I have personally reviewed the following:   Today's Data  . BUN/creatinine 24/1.8 bicarb 21-prior was 26 . WBC 11.3 .  Procalcitonin 0.22 . Ammonia 22  Lab Data  . CXR my overview showed right basilar atelectasis infiltrate + left lung opacity  Micro Data  . None at this time  Imaging  . As above  Cardiology Data  . As below  Other Data  . As below  Scheduled Meds: . amLODipine  10 mg Per Tube Daily  . amoxicillin-clavulanate  1 tablet Oral Q12H  . chlorhexidine  15 mL Mouth Rinse BID  . chlorhexidine gluconate (MEDLINE KIT)  15 mL Mouth Rinse BID  . Chlorhexidine Gluconate Cloth  6 each Topical Q0600  . heparin injection (subcutaneous)  5,000 Units Subcutaneous Q8H  . insulin aspart  0-9 Units Subcutaneous Q4H  . lacosamide  100 mg Per Tube BID  . levETIRAcetam  500 mg Per Tube BID  . mouth rinse  15 mL Mouth Rinse q12n4p  . metoprolol tartrate  12.5 mg Per Tube BID  . pantoprazole sodium  40 mg Per Tube Daily  . sennosides  10 mL Per Tube Daily   Continuous Infusions:  Principal Problem:   Acute respiratory failure with hypoxia (HCC) Active Problems:   Status epilepticus (HCC)   AKI (acute kidney injury) ([place])   Encephalopathy   Aspiration pneumonia (HCC)   Aspiration into airway   Palliative care encounter   Goals of care, counseling/discussion   Palliative care by specialist   Dysphagia   At high risk for aspiration   History of stroke   LOS: 22 days   How to contact the [facility] Attending or Consulting provider [place] or covering provider during after hours [place], for this patient?  1. Check the care team in [facility] and look for a) attending/consulting TRH provider listed and b) the [facility] team listed 2. Log into [email or web address] and use [place]'s universal password to access. If you do not have the password, please contact the hospital operator. 3. Locate the [facility] provider you are looking for under [facility] and page to a number that you can be directly reached. 4. If you still have difficulty reaching the provider, please page the [facility] (Director on Call) for the  Hospitalists listed on amion for assistance.

## 2019-04-23 ENCOUNTER — Inpatient Hospital Stay (HOSPITAL_COMMUNITY): Payer: Medicaid Other

## 2019-04-23 LAB — COMPREHENSIVE METABOLIC PANEL
ALT: 22 U/L (ref 0–44)
AST: 28 U/L (ref 15–41)
Albumin: 1.9 g/dL — ABNORMAL LOW (ref 3.5–5.0)
Alkaline Phosphatase: 115 U/L (ref 38–126)
Anion gap: 11 (ref 5–15)
BUN: 25 mg/dL — ABNORMAL HIGH (ref 8–23)
CO2: 21 mmol/L — ABNORMAL LOW (ref 22–32)
Calcium: 8.3 mg/dL — ABNORMAL LOW (ref 8.9–10.3)
Chloride: 110 mmol/L (ref 98–111)
Creatinine, Ser: 1.79 mg/dL — ABNORMAL HIGH (ref 0.61–1.24)
GFR calc Af Amer: 46 mL/min — ABNORMAL LOW (ref >60)
GFR calc non Af Amer: 39 mL/min — ABNORMAL LOW (ref >60)
Glucose, Bld: 135 mg/dL — ABNORMAL HIGH (ref 70–99)
Potassium: 3.8 mmol/L (ref 3.5–5.1)
Sodium: 142 mmol/L (ref 135–145)
Total Bilirubin: 0.5 mg/dL (ref 0.3–1.2)
Total Protein: 6.9 g/dL (ref 6.5–8.1)

## 2019-04-23 LAB — CBC WITH DIFFERENTIAL/PLATELET
Abs Immature Granulocytes: 0.04 10*3/uL (ref 0.00–0.07)
Basophils Absolute: 0 10*3/uL (ref 0.0–0.1)
Basophils Relative: 0 %
Eosinophils Absolute: 0.1 10*3/uL (ref 0.0–0.5)
Eosinophils Relative: 1 %
HCT: 33.6 % — ABNORMAL LOW (ref 39.0–52.0)
Hemoglobin: 11.3 g/dL — ABNORMAL LOW (ref 13.0–17.0)
Immature Granulocytes: 0 %
Lymphocytes Relative: 18 %
Lymphs Abs: 1.7 10*3/uL (ref 0.7–4.0)
MCH: 28 pg (ref 26.0–34.0)
MCHC: 33.6 g/dL (ref 30.0–36.0)
MCV: 83.2 fL (ref 80.0–100.0)
Monocytes Absolute: 1.2 10*3/uL — ABNORMAL HIGH (ref 0.1–1.0)
Monocytes Relative: 12 %
Neutro Abs: 6.4 10*3/uL (ref 1.7–7.7)
Neutrophils Relative %: 69 %
Platelets: 361 10*3/uL (ref 150–400)
RBC: 4.04 MIL/uL — ABNORMAL LOW (ref 4.22–5.81)
RDW: 16 % — ABNORMAL HIGH (ref 11.5–15.5)
WBC: 9.4 10*3/uL (ref 4.0–10.5)
nRBC: 0 % (ref 0.0–0.2)

## 2019-04-23 LAB — GLUCOSE, CAPILLARY
Glucose-Capillary: 116 mg/dL — ABNORMAL HIGH (ref 70–99)
Glucose-Capillary: 133 mg/dL — ABNORMAL HIGH (ref 70–99)
Glucose-Capillary: 137 mg/dL — ABNORMAL HIGH (ref 70–99)
Glucose-Capillary: 142 mg/dL — ABNORMAL HIGH (ref 70–99)
Glucose-Capillary: 145 mg/dL — ABNORMAL HIGH (ref 70–99)
Glucose-Capillary: 163 mg/dL — ABNORMAL HIGH (ref 70–99)
Glucose-Capillary: 94 mg/dL (ref 70–99)

## 2019-04-23 NOTE — Progress Notes (Signed)
Mittens removed at 1140.

## 2019-04-23 NOTE — Progress Notes (Signed)
Physical Therapy Treatment Patient Details Name: Chad Mcclure MRN: [id number] DOB: 08/20/1955 Today's Date: 04/23/2019    History of Present Illness Chad Mcclure is a 63 y.o. male who has a PMH including but not limited to HTN, HLD, HCV, Left CVA, DM, schizophrenia, dementia and who resides at [facility] in [place].  He was admitted to [facility] on 03/18/19 with fever due PNA, required intubated on 8/21 for hypoxic respiratory failure, extubated 8/29 then required re-intubation 8/31 for staus epilepticus then transferred to [facility] 9/2.    PT Comments    Pt making progress with mobility. Cognitively able to participate more.    Follow Up Recommendations  SNF     Equipment Recommendations  None recommended by PT    Recommendations for Other Services       Precautions / Restrictions Precautions Precautions: Fall Precaution Comments: PEG tube Required Braces or Orthoses: Other Brace Other Brace: resting hand splint L UE Restrictions Weight Bearing Restrictions: No    Mobility  Bed Mobility Overal bed mobility: Needs Assistance Bed Mobility: Supine to Sit;Sit to Sidelying     Supine to sit: Mod assist;HOB elevated   Sit to sidelying: Mod assist General bed mobility comments: Assist to elevate trunk into sitting and bring hips to EOB. Assist to bring legs back up into bed returning to sidelying  Transfers Overall transfer level: Needs assistance Equipment used: (Holding onto back of a straight chair) Transfers: Sit to/from Stand Sit to Stand: +2 physical assistance;Mod assist;Max assist         General transfer comment: Assist to bring hips up and for balance. Pt holding back of chair with RUE. Pt with hips and trunk in flexion  Ambulation/Gait                 Stairs             Wheelchair Mobility    Modified Rankin (Stroke Patients Only)       Balance Overall balance assessment: Needs assistance Sitting-balance support: Feet  supported;Single extremity supported Sitting balance-Leahy Scale: Poor Sitting balance - Comments: min to mod assist to sit EOB x 10 minutes Postural control: Posterior lean;Right lateral lean Standing balance support: Single extremity supported Standing balance-Leahy Scale: Zero Standing balance comment: +2 mod/max to stand x 3 for 20-30 sec                            Cognition Arousal/Alertness: Awake/alert Behavior During Therapy: Flat affect;Restless Overall Cognitive Status: No family/caregiver present to determine baseline cognitive functioning Area of Impairment: Following commands;Safety/judgement;Awareness;Problem solving                   Current Attention Level: Sustained   Following Commands: Follows one step commands consistently;Follows one step commands with increased time Safety/Judgement: Decreased awareness of safety            Exercises      General Comments General comments (skin integrity, edema, etc.): Pt on 2L of O2 with SpO2 dropping to low 80's at end of session. Incr to 5L with recovery to 90%      Pertinent Vitals/Pain Pain Assessment: Faces Faces Pain Scale: No hurt    Home Living                      Prior Function            PT Goals (current goals can now be found  in the care plan section) Acute Rehab PT Goals PT Goal Formulation: Patient unable to participate in goal setting Time For Goal Achievement: 05/07/19 Potential to Achieve Goals: Fair Progress towards PT goals: Progressing toward goals;Goals met and updated - see care plan    Frequency    Min 2X/week      PT Plan Current plan remains appropriate    Co-evaluation              AM-PAC PT "6 Clicks" Mobility   Outcome Measure  Help needed turning from your back to your side while in a flat bed without using bedrails?: A Lot Help needed moving from lying on your back to sitting on the side of a flat bed without using bedrails?: A  Lot Help needed moving to and from a bed to a chair (including a wheelchair)?: Total Help needed standing up from a chair using your arms (e.g., wheelchair or bedside chair)?: Total Help needed to walk in hospital room?: Total Help needed climbing 3-5 steps with a railing? : Total 6 Click Score: 8    End of Session Equipment Utilized During Treatment: Oxygen Activity Tolerance: Treatment limited secondary to medical complications (Comment);Patient limited by fatigue(Pt with decr SpO2 as fatigued) Patient left: in bed;with bed alarm set Nurse Communication: Mobility status PT Visit Diagnosis: Other abnormalities of gait and mobility (R26.89)     Time: 1610-9604 PT Time Calculation (min) (ACUTE ONLY): 21 min  Charges:  $Therapeutic Activity: 8-22 mins                     [place] Pager [phone] Office [place] 04/23/2019, 1:57 PM

## 2019-04-23 NOTE — Progress Notes (Signed)
PROGRESS NOTE  Chad Mcclure KZL:[id number] DOB: [birth year] DOA: 03/31/2019 PCP: Patient, No Pcp Per  Brief History   63 prior community dwelling black male HTN HLD HCV?  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 admitted from [facility] 8/20 fevers suspect related to sepsis/cystitis-intubated 8/21-reintubated 8/31 for status epilepticus failed extubation 9/2 and was transferred to [facility]- Initially required TTS dialysis right subclavian permacath-transferred to hospitalist 04/06/2019-Rx MRSA pneumonia seizure persisting encephalopathy Came off dialysis IR PEG placement 9/22-palliative care discussion performed family wished everything to be done  A & P  Acute metabolic encephalopathy EEG 9/3 severe encephalopathy no seizures-continue Keppra, Vimpat, other meds prolonged encephalopathic state 2/2 prolonged seizures/status-therapy to see in the outpatient setting Would continue aspirin for secondary stroke prevention as he is high risk  Acute hypoxic respiratory failure, MRSA pneumonia Decompensated 9/23 2/2 iatrogenic morphine given versus aspiration- transferred to the ICU for Narcan GTT and is now off of the same Respiratory failure much improved, CXR = bilateral pneumonia Transition from Unasyn 9/24 Augmentin 9/24 and date antibiotics 9/29 Outpatient goals of care needed  AKI 2/2 contrast nephropathy, nephropathy, hypernatremia from free water deficit Dialysis started in [place] on 8/24 nephrology saw the patient had a PermCath which was removed renal function stabilized during hospital stay Slightly acidotic however overall same trend monitor  DM TY 2, dysphagia secondary to massive stroke in the past and encephalopathy with no ability to feed himself IR PEG placement 9/22 feeding protocol as per dietician/free water q 3 Currently at goals with feeds at 55 per RN SSI as needed for diabetes as an outpatient?-Continue glycemic control-CBG  94-1 63 continue Osmolite 1.5  HTN-continue amlodipine, metoprolol 12.5 twice daily, hydralazine 25 daily and titrate meds if blood pressure is high             NO Lisinopril given prerenal azotemia this admission  DVT prophylaxis: lovenox Code Status: Full Family Communication: spoke with Mother in law Disposition Plan: transfer to [facility]    Pleas Koch, MD [facility] 1:54 PM  04/23/2019, 1:54 PM  LOS: 23 days   Consultants  . pccm  Procedures  . multiple3  Antibiotics  .  unasyn-->augmentin  Interval History/Subjective  Awake alert more cognizant able to follow commands verbalizing very slowly but able to comprehend it seems  Objective   Vitals:  Vitals:   04/23/19 1326 04/23/19 1336  BP:    Pulse: 93 100  Resp: (!) 21 20  Temp:    SpO2: 96% 94%    Exam:  More coherent no distress EOMI tracks fairly well  mitten on the right hand mucosa is dry Patient has a splint on left wrist Chest is clinically clear with no added sound rales rhonchi Sinus rhythm on exam PEG tube in place site looks clean abdomen slightly distended no rebound No lower extremity edema Integumentary Neurologically intact however dense deficit on left side mainly in the left upper extremity Cannot assess psych  I have personally reviewed the following:   Today's Data  . BUN/creatinine 24/1.8 . bicarb 21 . WBC 11.3-->9.4 . Procalcitonin 0.22  Lab Data  . CXR my overview showed right basilar atelectasis infiltrate + left lung opacity  Micro Data  . None at this time  Imaging  . As above  Cardiology Data  . As below  Other Data  . As below  Scheduled Meds: . amLODipine  10 mg Per Tube Daily  . amoxicillin-clavulanate  1 tablet Oral  Q12H  . chlorhexidine  15 mL Mouth Rinse BID  . Chlorhexidine Gluconate Cloth  6 each Topical Q0600  . feeding supplement (PRO-STAT SUGAR FREE 64)  30 mL Per Tube BID  . free water  200 mL Per Tube Q4H  . heparin injection  (subcutaneous)  5,000 Units Subcutaneous Q8H  . insulin aspart  0-9 Units Subcutaneous Q4H  . lacosamide  100 mg Per Tube BID  . levETIRAcetam  500 mg Per Tube BID  . mouth rinse  15 mL Mouth Rinse q12n4p  . metoprolol tartrate  12.5 mg Per Tube BID  . pantoprazole sodium  40 mg Per Tube Daily  . sennosides  10 mL Per Tube Daily   Continuous Infusions: . feeding supplement (OSMOLITE 1.5 CAL) 55 mL/hr at 04/23/19 1314    Principal Problem:   Acute respiratory failure with hypoxia (HCC) Active Problems:   Status epilepticus ([place])   AKI (acute kidney injury) ([place])   Encephalopathy   Aspiration pneumonia (HCC)   Aspiration into airway   Palliative care encounter   Goals of care, counseling/discussion   Palliative care by specialist   Dysphagia   At high risk for aspiration   History of stroke   LOS: 23 days   Verneita Griffes, MD [facility] 1:54 PM

## 2019-04-24 LAB — CBC WITH DIFFERENTIAL/PLATELET
Abs Immature Granulocytes: 0.03 10*3/uL (ref 0.00–0.07)
Basophils Absolute: 0 10*3/uL (ref 0.0–0.1)
Basophils Relative: 0 %
Eosinophils Absolute: 0.1 10*3/uL (ref 0.0–0.5)
Eosinophils Relative: 2 %
HCT: 35.8 % — ABNORMAL LOW (ref 39.0–52.0)
Hemoglobin: 12.2 g/dL — ABNORMAL LOW (ref 13.0–17.0)
Immature Granulocytes: 0 %
Lymphocytes Relative: 29 %
Lymphs Abs: 2.6 10*3/uL (ref 0.7–4.0)
MCH: 28.3 pg (ref 26.0–34.0)
MCHC: 34.1 g/dL (ref 30.0–36.0)
MCV: 83.1 fL (ref 80.0–100.0)
Monocytes Absolute: 1 10*3/uL (ref 0.1–1.0)
Monocytes Relative: 12 %
Neutro Abs: 5.2 10*3/uL (ref 1.7–7.7)
Neutrophils Relative %: 57 %
Platelets: 404 10*3/uL — ABNORMAL HIGH (ref 150–400)
RBC: 4.31 MIL/uL (ref 4.22–5.81)
RDW: 15.9 % — ABNORMAL HIGH (ref 11.5–15.5)
WBC: 9.1 10*3/uL (ref 4.0–10.5)
nRBC: 0 % (ref 0.0–0.2)

## 2019-04-24 LAB — RENAL FUNCTION PANEL
Albumin: 2 g/dL — ABNORMAL LOW (ref 3.5–5.0)
Anion gap: 9 (ref 5–15)
BUN: 23 mg/dL (ref 8–23)
CO2: 22 mmol/L (ref 22–32)
Calcium: 8.5 mg/dL — ABNORMAL LOW (ref 8.9–10.3)
Chloride: 113 mmol/L — ABNORMAL HIGH (ref 98–111)
Creatinine, Ser: 1.69 mg/dL — ABNORMAL HIGH (ref 0.61–1.24)
GFR calc Af Amer: 49 mL/min — ABNORMAL LOW (ref >60)
GFR calc non Af Amer: 42 mL/min — ABNORMAL LOW (ref >60)
Glucose, Bld: 178 mg/dL — ABNORMAL HIGH (ref 70–99)
Phosphorus: 3.9 mg/dL (ref 2.5–4.6)
Potassium: 4.1 mmol/L (ref 3.5–5.1)
Sodium: 144 mmol/L (ref 135–145)

## 2019-04-24 LAB — GLUCOSE, CAPILLARY
Glucose-Capillary: 161 mg/dL — ABNORMAL HIGH (ref 70–99)
Glucose-Capillary: 189 mg/dL — ABNORMAL HIGH (ref 70–99)
Glucose-Capillary: 105 mg/dL — ABNORMAL HIGH (ref 70–99)

## 2019-04-24 LAB — SARS CORONAVIRUS 2 BY RT PCR (HOSPITAL ORDER, PERFORMED IN ~~LOC~~ HOSPITAL LAB): SARS Coronavirus 2: NEGATIVE

## 2019-04-24 MED ORDER — FREE WATER: 200.0000 mL | Status: AC

## 2019-04-24 MED ORDER — PRO-STAT SUGAR FREE PO LIQD: 30.0000 mL | Freq: Two times a day (BID) | ORAL | 0 refills | Status: AC

## 2019-04-24 MED ORDER — HEPARIN SODIUM (PORCINE) 5000 UNIT/ML IJ SOLN: 5000.0000 [IU] | Freq: Three times a day (TID) | INTRAMUSCULAR | Status: AC

## 2019-04-24 MED ORDER — OSMOLITE 1.5 CAL PO LIQD: 1000.0000 mL | ORAL | 0 refills | Status: AC

## 2019-04-24 MED ORDER — AMOXICILLIN-POT CLAVULANATE 875-125 MG PO TABS: 1.0000 | ORAL_TABLET | Freq: Two times a day (BID) | ORAL | 0 refills | Status: AC

## 2019-04-24 NOTE — TOC Progression Note (Signed)
Transition of Care [facility]) - Progression Note    Patient Details  Name: Chad Mcclure MRN: [id number] Date of Birth: [birth year]  Transition of Care [facility]) CM/SW [place], [place] Phone Number: 04/24/2019, 11:35 AM  Clinical Narrative:     CSW called and spoke with [place] with [facility] in [place], [place]. They would be able to accept the patient back toady if he received a COVID screen. CSW was instructed that he would have to be in the building by 2:00pm. CSW was provided his room number and number for report. Christy requested updated clinicals. CSW faxed requested clinicals along with the discharge summary.   CSW informed MD and nursing staff that the patient needed a rapid COVID screen in order to discharge today.   The patient will be able to discharge pending COVID results. CSW will continue to follow.   Expected Discharge Plan: [place] Barriers to Discharge: No Barriers Identified  Expected Discharge Plan and Services Expected Discharge Plan: [place] arrangements for the past 2 months: [place] Expected Discharge Date: 04/24/19                                     Social Determinants of Health (SDOH) Interventions    Readmission Risk Interventions No flowsheet data found.

## 2019-04-24 NOTE — Progress Notes (Signed)
Nurse paged doctor regarding missing printed prescriptions for patient after discharge. Doctor instructed nurse to call pharmacy. Pharmacists stated they could not help nurse and to have MD call pharmacy to do verbal order. Nurse notified doctor of pharmacy request. Will continue to follow up. Gwendolyn Grant, RN

## 2019-04-24 NOTE — TOC Transition Note (Signed)
Transition of Care [facility]) - CM/SW Discharge Note   Patient Details  Name: Chad Mcclure MRN: [id number] Date of Birth: 15-Mar-1956  Transition of Care [facility]) CM/SW Contact:  Gelene Mink, [place] Phone Number: 04/24/2019, 12:07 PM   Clinical Narrative:     Patient will DC to: [place] date: 04/24/2019 Family notified: Yes Transport by: Corey Harold   Per MD patient ready for DC to . RN, patient, patient's family, and facility notified of DC. Discharge Summary and FL2 sent to facility. RN to call report prior to discharge [phone]). The patient will report to room 55B.  DC packet on chart. Ambulance transport requested for patient.   CSW will sign off for now as social work intervention is no longer needed. Please consult [place] again if new needs arise.  Jannell Franta, LCSW-A [place]/Clinical Social Work Department Cell: [phone]    Final next level of care: [place] Barriers to Discharge: No Barriers Identified   Patient Goals and CMS Choice Patient states their goals for this hospitalization and ongoing recovery are:: Pt will return back to [email or web address] Compare Post Acute Care list provided to:: Other (Comment Required)(Pt will return back to his SNF) Choice offered to / list presented to : NA  Discharge Placement   Existing PASRR number confirmed : 04/20/19          Patient chooses bed at: [facility] Patient to be transferred to facility by: [place] Name of family member notified: [facility] Patient and family notified of of transfer: 04/24/19  Discharge Plan and Services                DME Arranged: N/A DME Agency: NA       HH Arranged: NA HH Agency: NA        Social Determinants of Health (SDOH) Interventions     Readmission Risk Interventions No flowsheet data found.

## 2019-04-24 NOTE — Progress Notes (Signed)
Report called and given to [place] [place]. Discharge instructions sent with patient. Patient belongings sent with patient. IV removed and telemetry discontinued. Patient transported by [facility]. Gwendolyn Grant, RN

## 2019-04-24 NOTE — Progress Notes (Signed)
Dr. Verlon Au reprinted prescriptions and signed off. Prescriptions faxed to number [phone].  04/24/19 1730 pm Gwendolyn Grant, RN

## 2019-04-24 NOTE — Discharge Summary (Addendum)
Physician Discharge Summary  Chad Mcclure MCN:[id number] DOB: [birth year] DOA: 03/31/2019  PCP: Patient, No Pcp Per  Admit date: 03/31/2019 Discharge date: 04/24/2019  Time spent: 45 minutes  Recommendations for Outpatient Follow-up:  1. Stop date for antibiotics is 9/29 = 5 days of aspiration pneumonia Rx-recommend chest x-ray in 2 weeks to ensure some clearing of infiltrates that were seen bilaterally 2. Needs cmet and cbc 1 week 3. Avoid at all costs narcotics-May use ibuprofen or other modalities of therapy 4. Needs cxr ~ 4 weeks-continue oxygen and titrate downwards if there does not appear to be great need over the next several weeks 5. Continue feeds and free water as per protocol and will need nutritionist to see the patient at the skilled facility 6. Needs palliative to follow-up if fails to thrive in the outpatient setting 7. Discharge to skilled facility when bed available  Discharge Diagnoses:  Principal Problem:   Acute respiratory failure with hypoxia (HCC) Active Problems:   Status epilepticus (HCC)   AKI (acute kidney injury) (HCC)   Encephalopathy   Aspiration pneumonia (HCC)   Aspiration into airway   Palliative care encounter   Goals of care, counseling/discussion   Palliative care by specialist   Dysphagia   At high risk for aspiration   History of stroke   Discharge Condition: Guarded with overall poor prognosis unless improved miraculously  Diet recommendation: Tube feeds needs to be n.p.o. until otherwise specified by speech therapy in the outpatient setting Filed Weights   04/22/19 0457 04/23/19 0500 04/24/19 0500  Weight: 85.7 kg 85.1 kg 80.9 kg    History of present illness:  63 prior community dwelling black male HTN HLD HCV?  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 admitted from [facility] 8/20 fevers suspect related to sepsis/cystitis-intubated 8/21-reintubated 8/31 for status epilepticus failed  extubation 9/2 and was transferred to [facility]- Initially required TTS dialysis right subclavian permacath-transferred to hospitalist 04/06/2019-Rx MRSA pneumonia seizure persisting encephalopathy Came off dialysis IR PEG placement 9/22-palliative care discussion performed family wished everything to be done  Hospital Course:  Acute metabolic encephalopathy EEG 9/3 severe encephalopathy no seizures-continue Keppra, Vimpat, other meds prolonged encephalopathic state 2/2 prolonged seizures/status-does not really follow commands will need outpatient neurocognitive work-up in the outpatient setting by speech and neurology as arranged by skilled facility  Would continue aspirin for secondary stroke prevention as he is high risk  Acute hypoxic respiratory failure, MRSA pneumonia Rx MRSA completed course of vancomycin this admission-is very high risk aspiration 9/23 developed possible aspiration and is being treated with antibiotics until 9/29 as above Intubated extubated this admission self extubated 9/5 currently using 3 to 4 L of oxygen pulmonary toilet  very sensitive to narcotics- iatrogenic metabolic encephalopathy -2/2 opiates on 9/23-please do not use narcotics he is much more stable much more coherent and has some rehab potential to rehab goals If continues to aspirate and spiked fevers-would recommend goals of care reconsidered outpatient-please see below  AKI 2/2 contrast nephropathy, nephropathy, hypernatremia from free water deficit Dialysis started in [place] on 8/24 nephrology saw the patient had a PermCath which was removed renal function stabilized during hospital stay  DM TY 2, dysphagia secondary to massive stroke in the past and encephalopathy with no ability to feed himself IR PEG placement 9/22 feeding protocol as per them and will need free water in addition every 3 hourly as per MAR, vitamins etc. SSI as needed for diabetes as an outpatient?-we will  need to continue metformin and  adjust this according to labs and glycemic control in the outpatient setting  HTN-continue amlodipine, metoprolol 12.5 twice daily, hydralazine 25 daily and titrate meds if blood pressure is high  NO Lisinopril given prerenal azotemia this admission   Procedures:  multiple   Consultations:  Neuro   nephro  Discharge Exam: Vitals:   04/24/19 0340 04/24/19 0746  BP: 123/79 (!) 144/95  Pulse: (!) 106 (!) 126  Resp: 17 17  Temp: 97.9 F (36.6 C) (!) 97.4 F (36.3 C)  SpO2: (!) 85% 92%    Awake alert coherent, EOMI NCAT although favoring the right side >left side, able to lift the right hand and is much more interactive talkative and making some verbal posturing trying to communicate well Chest clear no added sound S1-S2 no murmur rub or gallop Abdomen soft nontender no rebound no guarding No lower extremity edema   Discharge Instructions   Discharge Instructions    Diet - low sodium heart healthy   Complete by: As directed    Increase activity slowly   Complete by: As directed      Allergies as of 04/24/2019   Not on File     Medication List    STOP taking these medications   lisinopril 10 MG tablet Commonly known as: ZESTRIL   omeprazole 20 MG capsule Commonly known as: PRILOSEC   vitamin C 250 MG tablet Commonly known as: ASCORBIC ACID   Vitamin D 50 MCG (2000 UT) Caps     TAKE these medications   acetaminophen 325 MG suppository Commonly known as: TYLENOL Place 650 mg rectally every 6 (six) hours as needed for mild pain or fever.   amLODipine 10 MG tablet Commonly known as: NORVASC Take 10 mg by mouth daily.   amoxicillin-clavulanate 875-125 MG tablet Commonly known as: AUGMENTIN Take 1 tablet by mouth every 12 (twelve) hours for 3 days.   aspirin EC 81 MG tablet Take 81 mg by mouth daily.   dorzolamide-timolol 22.3-6.8 MG/ML ophthalmic solution Commonly known as: COSOPT   feeding supplement (OSMOLITE 1.5 CAL) Liqd Place 1,000 mLs into  feeding tube continuous.   feeding supplement (OSMOLITE 1.5 CAL) Liqd Place 1,000 mLs into feeding tube continuous.   feeding supplement (PRO-STAT SUGAR FREE 64) Liqd Place 30 mLs into feeding tube 2 (two) times daily.   free water Soln Place 200 mLs into feeding tube every 4 (four) hours.   heparin 5000 UNIT/ML injection Inject 1 mL (5,000 Units total) into the skin every 8 (eight) hours.   hydrALAZINE 25 MG tablet Commonly known as: APRESOLINE Take 25 mg by mouth daily.   Lacosamide 100 MG Tabs Take 1 tablet (100 mg total) by mouth 2 (two) times daily.   levETIRAcetam 500 MG tablet Commonly known as: KEPPRA Take 1 tablet (500 mg total) by mouth 2 (two) times daily.   LORazepam 2 MG/ML injection Commonly known as: ATIVAN Inject 0.5-2 mLs (1-4 mg total) into the vein every 2 (two) hours as needed for anxiety.   metFORMIN 1000 MG tablet Commonly known as: GLUCOPHAGE Take 1,000 mg by mouth 2 (two) times daily with a meal.   metoprolol tartrate 25 MG tablet Commonly known as: LOPRESSOR Place 0.5 tablets (12.5 mg total) into feeding tube 2 (two) times daily.   pantoprazole sodium 40 mg/20 mL Pack Commonly known as: PROTONIX Place 20 mLs (40 mg total) into feeding tube daily.   travoprost (benzalkonium) 0.004 % ophthalmic solution Commonly known as: TRAVATAN  Place 1 drop into both eyes at bedtime.   traZODone 50 MG tablet Commonly known as: DESYREL Take 0.5 tablets (25 mg total) by mouth at bedtime. What changed: how much to take   zinc sulfate 220 (50 Zn) MG capsule Take 220 mg by mouth daily.      Not on File Contact information for after-discharge care    Destination    [facility] .   Service: Skilled Nursing Contact information: [address] [place] [address] [phone]               The results of significant diagnostics from this hospitalization (including imaging, microbiology, ancillary and laboratory)  are listed below for reference.    Significant Diagnostic Studies: Dg Chest 1 View  Result Date: 04/08/2019 CLINICAL DATA:  Aspiration. EXAM: CHEST  1 VIEW COMPARISON:  04/03/2019 FINDINGS: The endotracheal tube has been removed. The right jugular catheter has also been removed. A nasogastric tube remains in place with its side hole in the mid esophagus and tip in the distal esophagus. A poor inspiration is again demonstrated with mild right basilar atelectasis. Patchy opacity in the left mid and lower lung zones without significant change. No pleural fluid seen. Unremarkable bones. IMPRESSION: 1. Stable probable pneumonia in the left mid and lower lung zones. 2. Poor inspiration with mild right basilar atelectasis. 3. Nasogastric tube tip and side hole in the esophagus. It is recommended that this be advanced. Electronically Signed   By: Beckie Salts M.D.   On: 04/08/2019 17:51   Dg Chest 2 View  Result Date: 04/23/2019 CLINICAL DATA:  Pneumonia EXAM: CHEST - 2 VIEW COMPARISON:  Yesterday FINDINGS: Patchy bilateral airspace disease consistent with history of pneumonia. Lung volumes are low. Normal heart size for technique. No visible effusion or pneumothorax. Percutaneous gastrostomy tube with oral contrast seen in the colon. IMPRESSION: Stable low volume chest with bilateral pneumonia. Electronically Signed   By: Marnee Spring M.D.   On: 04/23/2019 09:26   Dg Abd 1 View  Result Date: 04/20/2019 CLINICAL DATA:  Preop gastrostomy placement. EXAM: ABDOMEN - 1 VIEW COMPARISON:  Radiograph of April 09, 2019. FINDINGS: No abnormal bowel dilatation is noted. Residual contrast is seen throughout the nondilated colon. Distal tip of Dobbhoff tube is seen in expected position of distal stomach. No radio-opaque calculi or other significant radiographic abnormality are seen. IMPRESSION: No evidence of bowel obstruction or ileus. Residual contrast remains throughout nondilated colon. Electronically Signed    By: Lupita Raider M.D.   On: 04/20/2019 09:17   Dg Abd 1 View  Result Date: 04/08/2019 CLINICAL DATA:  Nasogastric tube placement. EXAM: ABDOMEN - 1 VIEW COMPARISON:  Earlier today. Chest radiograph obtained at the same time. FINDINGS: Nasogastric tube tip in the distal esophagus and side hole in the mid esophagus. Normal bowel gas pattern. Lung changes described on the portable chest obtained at the same time. Multiple metallic densities overlying the upper right pelvis. Lumbar and lower thoracic spine degenerative changes. IMPRESSION: Nasogastric tube tip in the distal esophagus and side hole in the mid esophagus. It is recommended that this be advanced. Electronically Signed   By: Beckie Salts M.D.   On: 04/08/2019 17:53   Dg Abd 1 View  Result Date: 04/08/2019 CLINICAL DATA:  Check nasogastric catheter EXAM: ABDOMEN - 1 VIEW COMPARISON:  04/07/2019 FINDINGS: Scattered large and small bowel gas is noted. Previously seen nasogastric catheter has been withdrawn and now lies in the distal  esophagus. This should be advanced several cm further into the stomach. IMPRESSION: Nasogastric catheter within the distal esophagus. This should be advanced further into the stomach. Electronically Signed   By: Inez Catalina M.D.   On: 04/08/2019 15:43   Dg Abd 1 View  Result Date: 04/07/2019 CLINICAL DATA:  NG tube placement EXAM: ABDOMEN - 1 VIEW COMPARISON:  04/04/2019 FINDINGS: NG tube tip is in the mid to distal stomach. This has been slightly advanced since prior study. Nonobstructive bowel gas pattern. IMPRESSION: NG tube tip in the mid to distal stomach. Electronically Signed   By: Rolm Baptise M.D.   On: 04/07/2019 23:40   Dg Abd 1 View  Result Date: 04/02/2019 CLINICAL DATA:  MRI clearance. EXAM: ABDOMEN - 1 VIEW COMPARISON:  None. FINDINGS: Enteric tube is partially visualized. There are no radiopaque foreign bodies that would preclude MRI imaging. High-density bowel contents is consistent with ingested  material. No bowel obstruction or free air. IMPRESSION: No radiopaque foreign bodies that would preclude MRI imaging. Electronically Signed   By: Keith Rake M.D.   On: 04/02/2019 01:31   Ct Head Wo Contrast  Result Date: 03/31/2019 CLINICAL DATA:  Initial evaluation for acute altered mental status. EXAM: CT HEAD WITHOUT CONTRAST TECHNIQUE: Contiguous axial images were obtained from the base of the skull through the vertex without intravenous contrast. COMPARISON:  Prior CT from 12/16/2010. FINDINGS: Brain: Moderate to advanced cerebral atrophy with chronic microvascular ischemic disease. No acute intracranial hemorrhage. No acute large vessel territory infarct. No mass lesion, midline shift or mass effect. Diffuse ventricular prominence related to global parenchymal volume loss of hydrocephalus. No extra-axial fluid collection. Vascular: No hyperdense vessel. Scattered vascular calcifications noted within the carotid siphons. Skull: Scalp soft tissues and calvarium within normal limits. Sinuses/Orbits: Globes orbital soft tissues within normal limits. Scattered mucosal thickening noted throughout the paranasal sinuses. Superimposed air-fluid levels noted within the sphenoid sinuses and right maxillary sinus. Fluid seen layering within the nasopharynx. Trace bilateral mastoid effusions. Patient is likely intubated. Other: None. IMPRESSION: 1. No acute intracranial abnormality. 2. Moderately advanced cerebral atrophy with chronic microvascular ischemic disease. Electronically Signed   By: Jeannine Boga M.D.   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 EXAM: MRA HEAD WITHOUT CONTRAST TECHNIQUE: Angiographic images of the Circle of Willis were obtained using MRA technique without intravenous contrast. COMPARISON:  MR head without contrast 04/02/2019. FINDINGS: The internal carotid  arteries are within normal limits from the high cervical segments through the ICA termini bilaterally. The A1 and M1 segments are normal. MCA bifurcations are intact. The ACA and MCA branch vessels are within normal limits. The left vertebral artery is dominant. Study is mildly degraded by patient motion. PICA origins are not clearly seen. Left PICA is patent. Basilar artery is within normal limits. Both posterior cerebral arteries originate from basilar tip. PCA branch vessels are within normal limits. IMPRESSION: Normal MRA circle-of-Willis without significant proximal stenosis, aneurysm, or branch vessel occlusion. No focal lesion to explain third nerve palsy. Electronically Signed   By: San Morelle M.D.   On: 04/03/2019 18:31   Mr Brain Wo Contrast  Result Date: 04/02/2019 CLINICAL DATA:  Initial evaluation for acute encephalopathy. EXAM: MRI HEAD WITHOUT CONTRAST TECHNIQUE: Multiplanar, multiecho pulse sequences of the brain and surrounding structures were obtained without intravenous contrast. COMPARISON:  Prior CT from 03/31/2019. FINDINGS: Brain:  Diffuse prominence of the CSF containing spaces compatible with generalized cerebral atrophy, fairly advanced in nature. Extensive confluent T2/FLAIR hyperintensity throughout the periventricular deep white matter both cerebral hemispheres as well as the pons, consistent with advanced chronic microvascular ischemic disease. Multiple superimposed remote lacunar infarcts seen involving the hemispheric cerebral white matter as well as the thalami and pons. No abnormal foci of restricted diffusion to suggest acute or subacute ischemia. Gray-white matter differentiation maintained without evidence for chronic cortical infarction. No acute intracranial hemorrhage. Multiple scattered chronic micro hemorrhages noted clustered about the periventricular white matter and pons, most likely related to chronic underlying hypertension. No mass lesion, mass effect, or  midline shift. Diffuse ventricular prominence related to global parenchymal volume loss without hydrocephalus. No extra-axial fluid collection. Pituitary gland and suprasellar region normal. Midline structures intact. Vascular: Major intracranial vascular flow voids are maintained. Skull and upper cervical spine: Craniocervical junction within normal limits. Upper cervical spine normal. Bone marrow signal intensity within normal limits. No scalp soft tissue abnormality. Sinuses/Orbits: Globes and orbital soft tissues within normal limits. Scattered mucosal thickening noted throughout the paranasal sinuses. Superimposed air-fluid levels noted within the right maxillary and sphenoid sinuses. Fluid seen layering within the posterior nasopharynx. Patient likely intubated. Small bilateral mastoid effusions also likely related to intubation as well. Other: None. IMPRESSION: 1. No acute intracranial abnormality. 2. Advanced cerebral atrophy with chronic microvascular ischemic disease, with multiple remote lacunar infarcts involving the hemispheric cerebral white matter, thalami, and pons. 3. Multiple scattered chronic micro hemorrhages clustered about the periventricular white matter and pons, most likely related to underlying poorly controlled hypertension. Electronically Signed   By: Rise Mu M.D.   On: 04/02/2019 02:52   Ir Gastrostomy Tube Mod Sed  Result Date: 04/20/2019 INDICATION: Dysphagia. Please place percutaneous gastrostomy tube for enteric nutrition supplementation purposes. EXAM: PULL TROUGH GASTROSTOMY TUBE PLACEMENT COMPARISON:  None. MEDICATIONS: Ancef 2 gm IV; Antibiotics were administered within 1 hour of the procedure. Glucagon 1 mg IV CONTRAST:  30 mL of Omnipaque 300 administered into the gastric lumen. ANESTHESIA/SEDATION: Moderate (conscious) sedation was employed during this procedure. A total of Versed 0.5 mg and Fentanyl 50 mcg was administered intravenously. Moderate Sedation  Time: 10 minutes. The patient's level of consciousness and vital signs were monitored continuously by radiology nursing throughout the procedure under my direct supervision. FLUOROSCOPY TIME:  3 minutes, 48 seconds (35 mGy) COMPLICATIONS: None immediate. PROCEDURE: Informed written consent was obtained from the patient's family following explanation of the procedure, risks, benefits and alternatives. A time out was performed prior to the initiation of the procedure. Ultrasound scanning was performed to demarcate the edge of the left lobe of the liver. Maximal barrier sterile technique utilized including caps, mask, sterile gowns, sterile gloves, large sterile drape, hand hygiene and Betadine prep. The left upper quadrant was sterilely prepped and draped. An oral gastric catheter was inserted into the stomach under fluoroscopy. The existing nasogastric feeding tube was removed. The left costal margin and barium/air opacified transverse colon were identified and avoided. Air was injected into the stomach for insufflation and visualization under fluoroscopy. Under sterile conditions a 17 gauge trocar needle was utilized to access the stomach percutaneously beneath the left subcostal margin after the overlying soft tissues were anesthetized with 1% Lidocaine with epinephrine. Needle position was confirmed within the stomach with aspiration of air and injection of small amount of contrast. A single T tack was deployed for gastropexy. Over an Amplatz guide wire, a 9-French sheath was inserted into  the stomach. A snare device was utilized to capture the oral gastric catheter. The snare device was pulled retrograde from the stomach up the esophagus and out the oropharynx. The 20-French pull-through gastrostomy was connected to the snare device and pulled antegrade through the oropharynx down the esophagus into the stomach and then through the percutaneous tract external to the patient. The gastrostomy was assembled  externally. Contrast injection confirms position in the stomach. Several spot radiographic images were obtained in various obliquities for documentation. The patient tolerated procedure well without immediate post procedural complication. FINDINGS: After successful fluoroscopic guided placement, the gastrostomy tube is appropriately positioned with internal disc against the ventral aspect of the gastric lumen. IMPRESSION: Successful fluoroscopic insertion of a 20-French pull-through gastrostomy tube. The gastrostomy may be used immediately for medication administration and in 24 hrs for the initiation of feeds. Electronically Signed   By: Simonne Come M.D.   On: 04/20/2019 09:42   US Renal  Result Date: 04/03/2019 CLINICAL DATA:  Acute renal insufficiency. EXAM: RENAL / URINARY TRACT ULTRASOUND COMPLETE COMPARISON:  None. FINDINGS: Right Kidney: Renal measurements: 12.3 x 6.5 x 5.2 cm = volume: 215 mL. Increased cortical echogenicity. Benign-appearing cyst is noted in the upper pole of the right kidney measuring 2.6 x 2.5 x 2.6 cm. A second smaller cyst is seen adjacent to the superior right renal pelvis measuring 1.5 x 1.2 x 1.1 cm. Left Kidney: Renal measurements: 12.9 x 6.5 x 5.3 cm = volume: 233 mL. Increased parenchymal echogenicity. Bladder: Appears normal for degree of bladder distention. IMPRESSION: Increased parenchymal echogenicity of the kidneys usually associated with intrinsic renal disease. Two benign-appearing right renal cysts. Electronically Signed   By: Ted Mcalpine M.D.   On: 04/03/2019 13:24   Ir Removal Tun Cv Cath W/o Fl  Result Date: 04/07/2019 INDICATION: Patient recently admitted to [facility] on 03/18/2019 for management of fever. His hospital course was complicated by hypoxic respiratory failure requiring intubation, status epilepticus, and AKI s/p tunneled right subclavian HD catheter placement at [facility] 03/22/2019. He was transferred to [facility] 03/31/2023  escalation of care. Since, renal function has improved and patient no longer requires hemodialysis. Request is made for removal of tunneled hemodialysis catheter. EXAM: REMOVAL OF TUNNELED HEMODIALYSIS CATHETER MEDICATIONS: None COMPLICATIONS: None immediate. PROCEDURE: Informed written consent was obtained from the patient's daughter, Johnn Krasowski, via telephone following an explanation of the procedure, risks, benefits and alternatives to treatment. A time out was performed prior to the initiation of the procedure. Maximal barrier sterile technique was utilized including mask, sterile gowns, sterile gloves, large sterile drape, hand hygiene, and Hibiclens. Utilizing gentle traction, the catheter was removed intact. Hemostasis was obtained with manual compression. A dressing was placed. The patient tolerated the procedure well without immediate post procedural complication. IMPRESSION: Successful removal of tunneled hemodialysis catheter. Read by: Elwin Mocha, PA-C Electronically Signed   By: Corlis Leak M.D.   On: 04/07/2019 14:49   Dg Chest Port 1 View  Result Date: 04/22/2019 CLINICAL DATA:  Respiratory failure with hypoxia. EXAM: PORTABLE CHEST 1 VIEW COMPARISON:  Radiograph of April 21, 2019. FINDINGS: The heart size and mediastinal contours are within normal limits. No pneumothorax or pleural effusion is noted. Increased left lung opacity is noted diffusely concerning for worsening pneumonia. Stable right basilar atelectasis or infiltrate is noted. The visualized skeletal structures are unremarkable. IMPRESSION: Stable right basilar atelectasis or infiltrate. Increased left lung opacity is noted concerning for worsening pneumonia. Electronically Signed   By: Fayrene Fearing  Christen Butter M.D.   On: 04/22/2019 08:42   Dg Chest Port 1 View  Result Date: 04/21/2019 CLINICAL DATA:  Pneumonia.  Cough. EXAM: PORTABLE CHEST 1 VIEW COMPARISON:  04/08/2019. FINDINGS: Mediastinum hilar structures normal. Heart  size stable. Patchy bilateral pulmonary infiltrates are again noted. Similar findings noted on prior exam. No pleural effusion or pneumothorax. Degenerative change thoracic spine. Contrast noted in the left upper quadrant. IMPRESSION: Patchy bilateral pulmonary infiltrates again noted without significant interim change. Electronically Signed   By: Maisie Fus  Register   On: 04/21/2019 12:18   Dg Chest Port 1 View  Result Date: 04/03/2019 CLINICAL DATA:  Patient admitted for management of acute kidney injury and possible status epilepticus. EXAM: PORTABLE CHEST 1 VIEW COMPARISON:  Single-view of the chest 04/01/2019 12/28/2010. FINDINGS: Support tubes and lines are unchanged and project in good position. Hazy bilateral pulmonary opacities seen on yesterday's examination persist without marked change. Heart size is normal. No pneumothorax or pleural effusion. IMPRESSION: No change in left worse than right patchy airspace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

## 2020-09-06 IMAGING — CT CT HEAD W/O CM
4 series · 16 of 47 positions shown, 18 images · non-contrast
Comparison: Prior CT from 12/16/2010.

CLINICAL DATA: Initial evaluation for acute altered mental status.

EXAM:
CT HEAD WITHOUT CONTRAST
TECHNIQUE: Contiguous axial images were obtained from the base of the skull
through the vertex without intravenous contrast.

[Series 3: head without · axial · non-contrast · 0.46mm/px · z∈[+1050,+1175]mm · 7 of 35 slices shown, 9 images]
[im 5/35  brain]
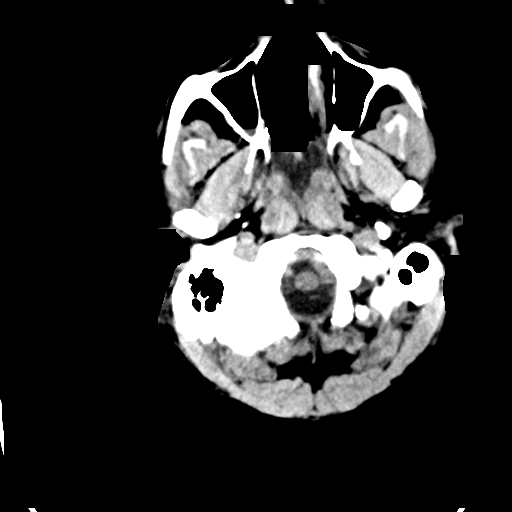
[im 5/35  bone]
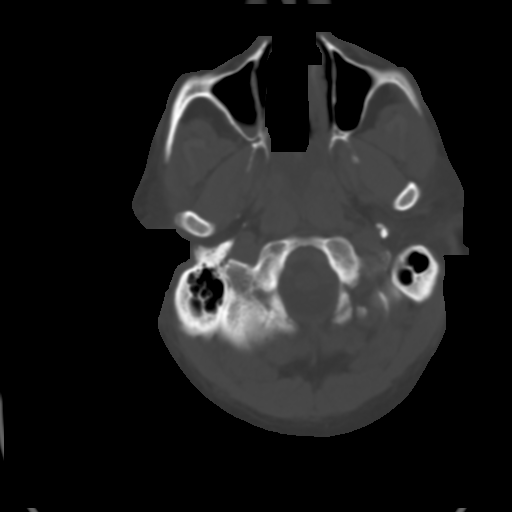
[im 9/35  brain]
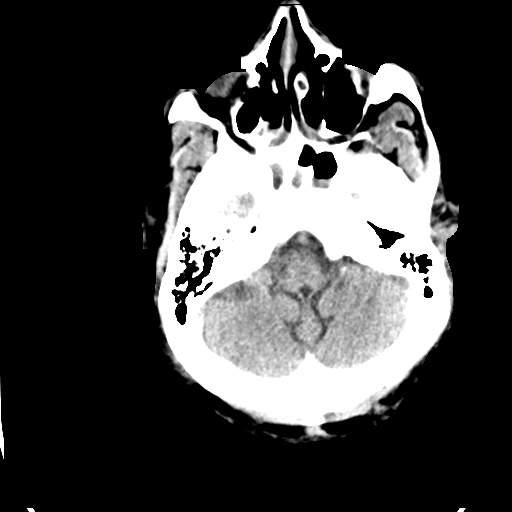
[im 13/35  brain]
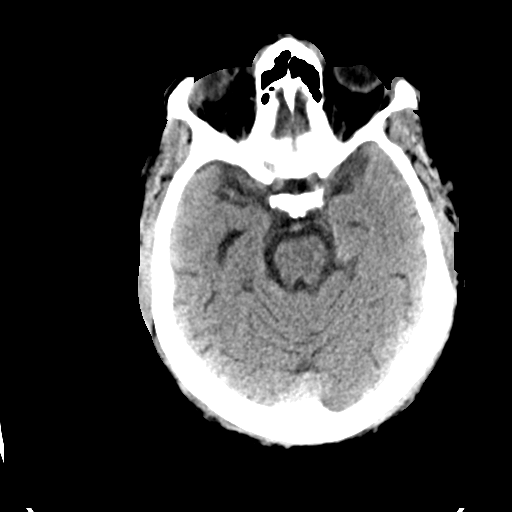
[im 18/35  brain]
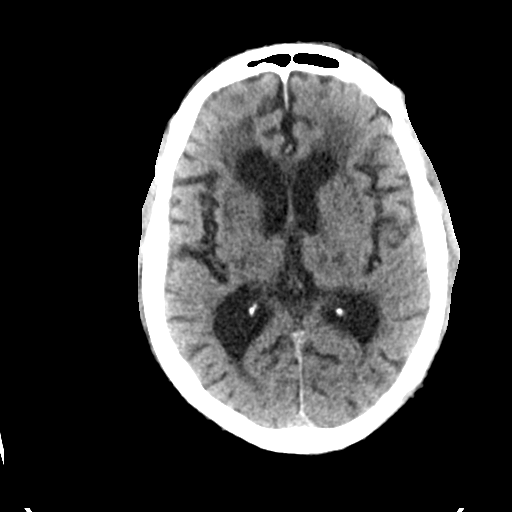
[im 22/35  brain]
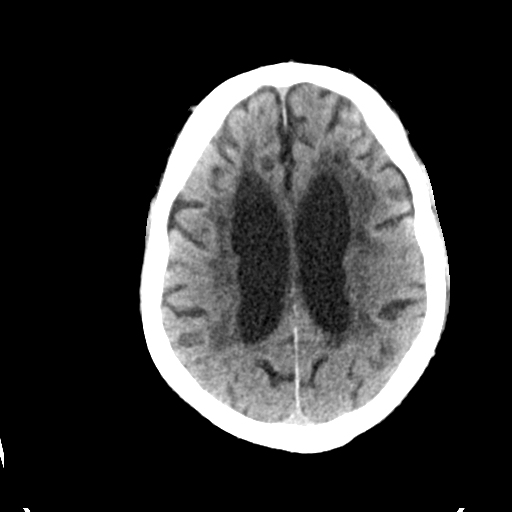
[im 22/35  bone]
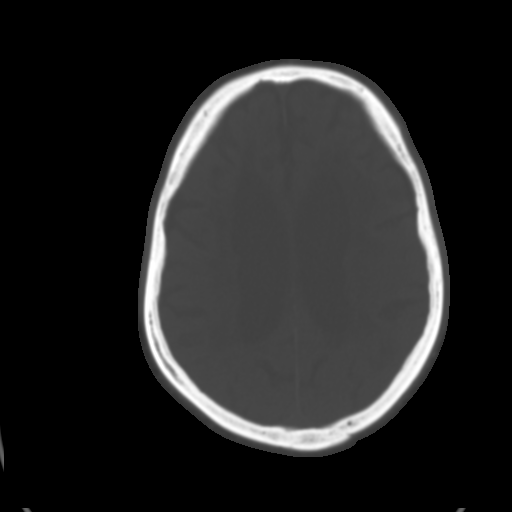
[im 26/35  brain]
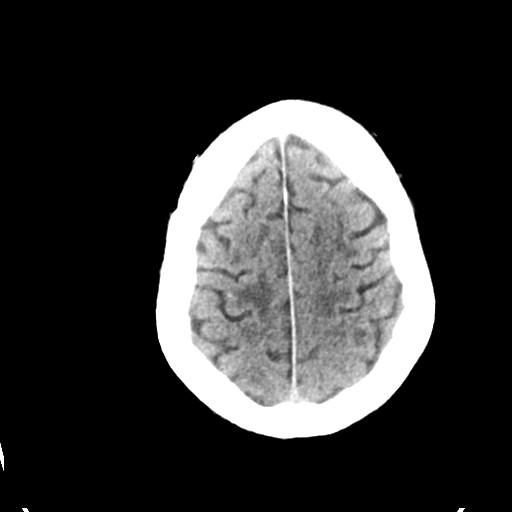
[im 30/35  brain]
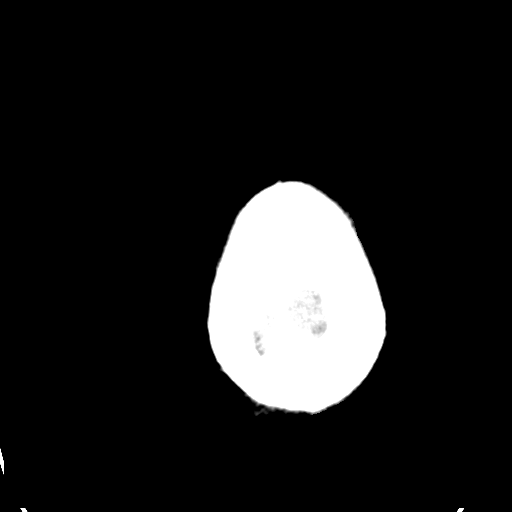

[Series 4: head bone · axial · 0.46mm/px · z∈[+1046,+1080]mm · 3 of 86 slices shown]
[im 9/86  bone]
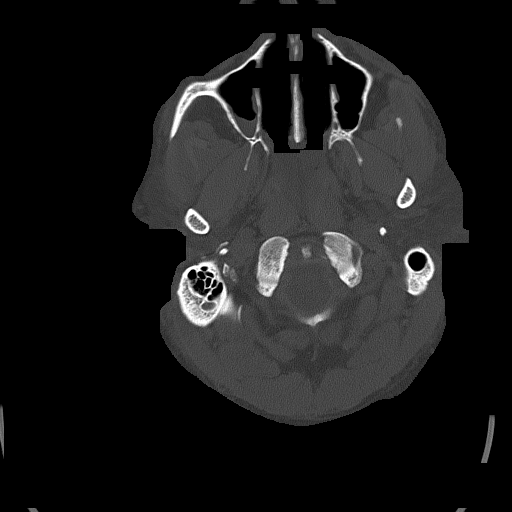
[im 18/86  bone]
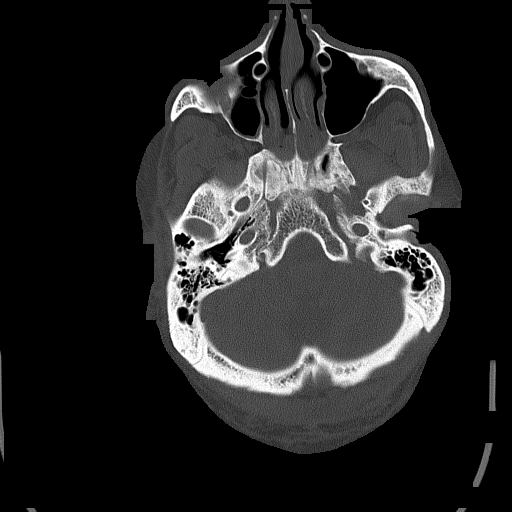
[im 26/86  bone]
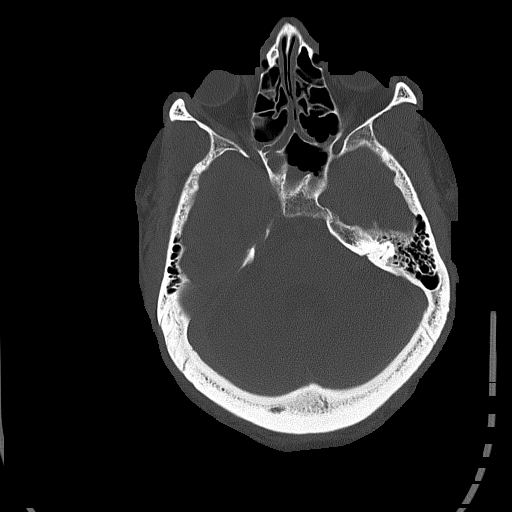

[Series 5: head without cor · coronal · non-contrast · 0.33mm/px · 3 of 74 slices shown]
[im 25/74  brain]
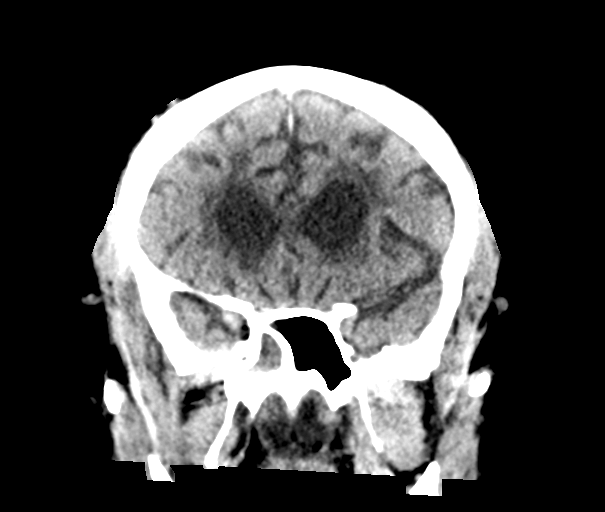
[im 33/74  brain]
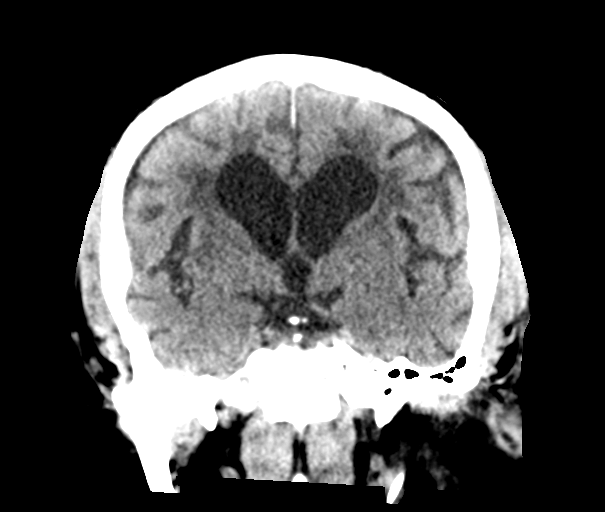
[im 41/74  brain]
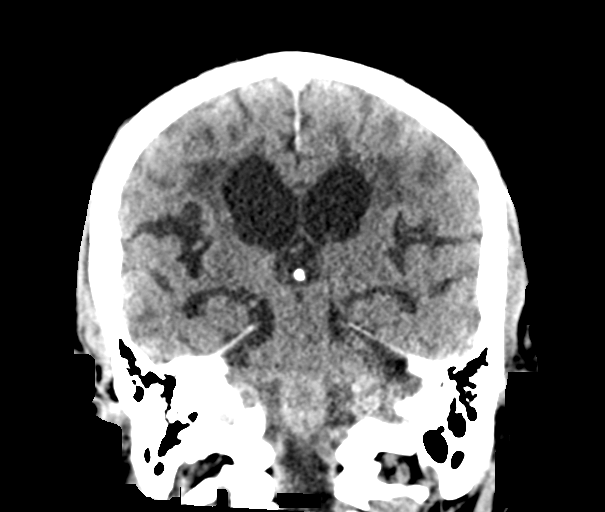

[Series 6: head without sag · sagittal · non-contrast · 0.36mm/px · 3 of 63 slices shown]
[im 21/63  brain]
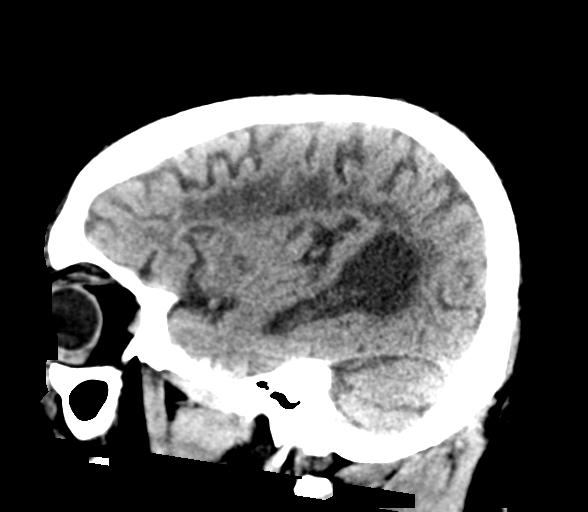
[im 32/63  brain]
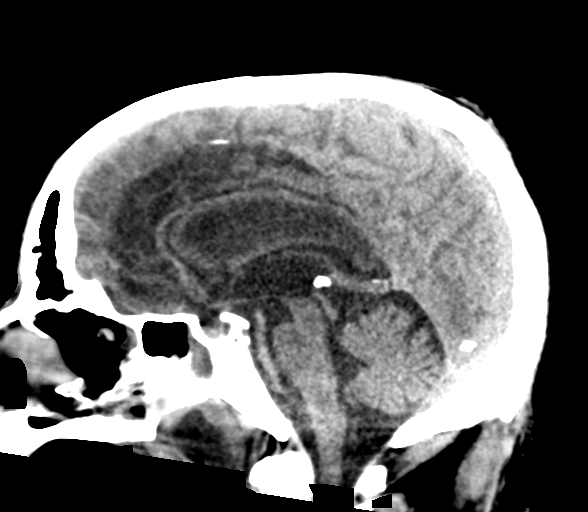
[im 42/63  brain]
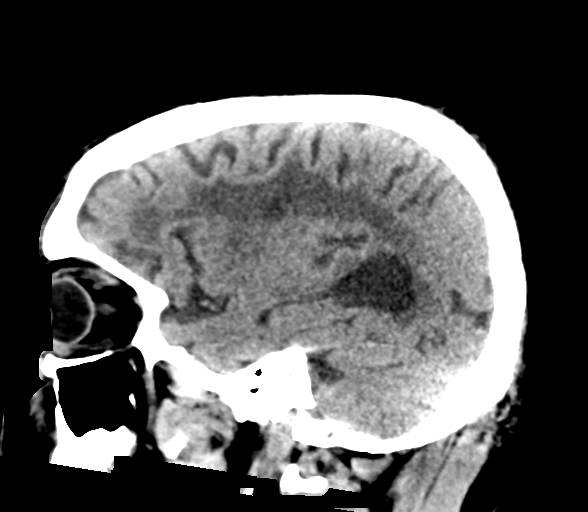

[16 of 47 positions shown; findings below may reference images not displayed]

FINDINGS: Brain: Moderate to advanced cerebral atrophy with chronic
microvascular ischemic disease. No acute intracranial hemorrhage. No
acute large vessel territory infarct. No mass lesion, midline shift
or mass effect. Diffuse ventricular prominence related to global
parenchymal volume loss of hydrocephalus. No extra-axial fluid
collection.

Vascular: No hyperdense vessel. Scattered vascular calcifications
noted within the carotid siphons.

Skull: Scalp soft tissues and calvarium within normal limits.

Sinuses/Orbits: Globes orbital soft tissues within normal limits.
Scattered mucosal thickening noted throughout the paranasal sinuses.
Superimposed air-fluid levels noted within the sphenoid sinuses and
right maxillary sinus. Fluid seen layering within the nasopharynx.
Trace bilateral mastoid effusions. Patient is likely intubated.

Other: None.
IMPRESSION: 1. No acute intracranial abnormality.
2. Moderately advanced cerebral atrophy with chronic microvascular
ischemic disease.

## 2020-09-09 IMAGING — DX DG CHEST 1V PORT
1 series · 1 of 1 positions shown · non-contrast
Comparison: Single-view of the chest 04/01/2019 12/28/2010.

CLINICAL DATA: Patient admitted for management of acute kidney
injury and possible status epilepticus.

EXAM:
PORTABLE CHEST 1 VIEW

[chest]
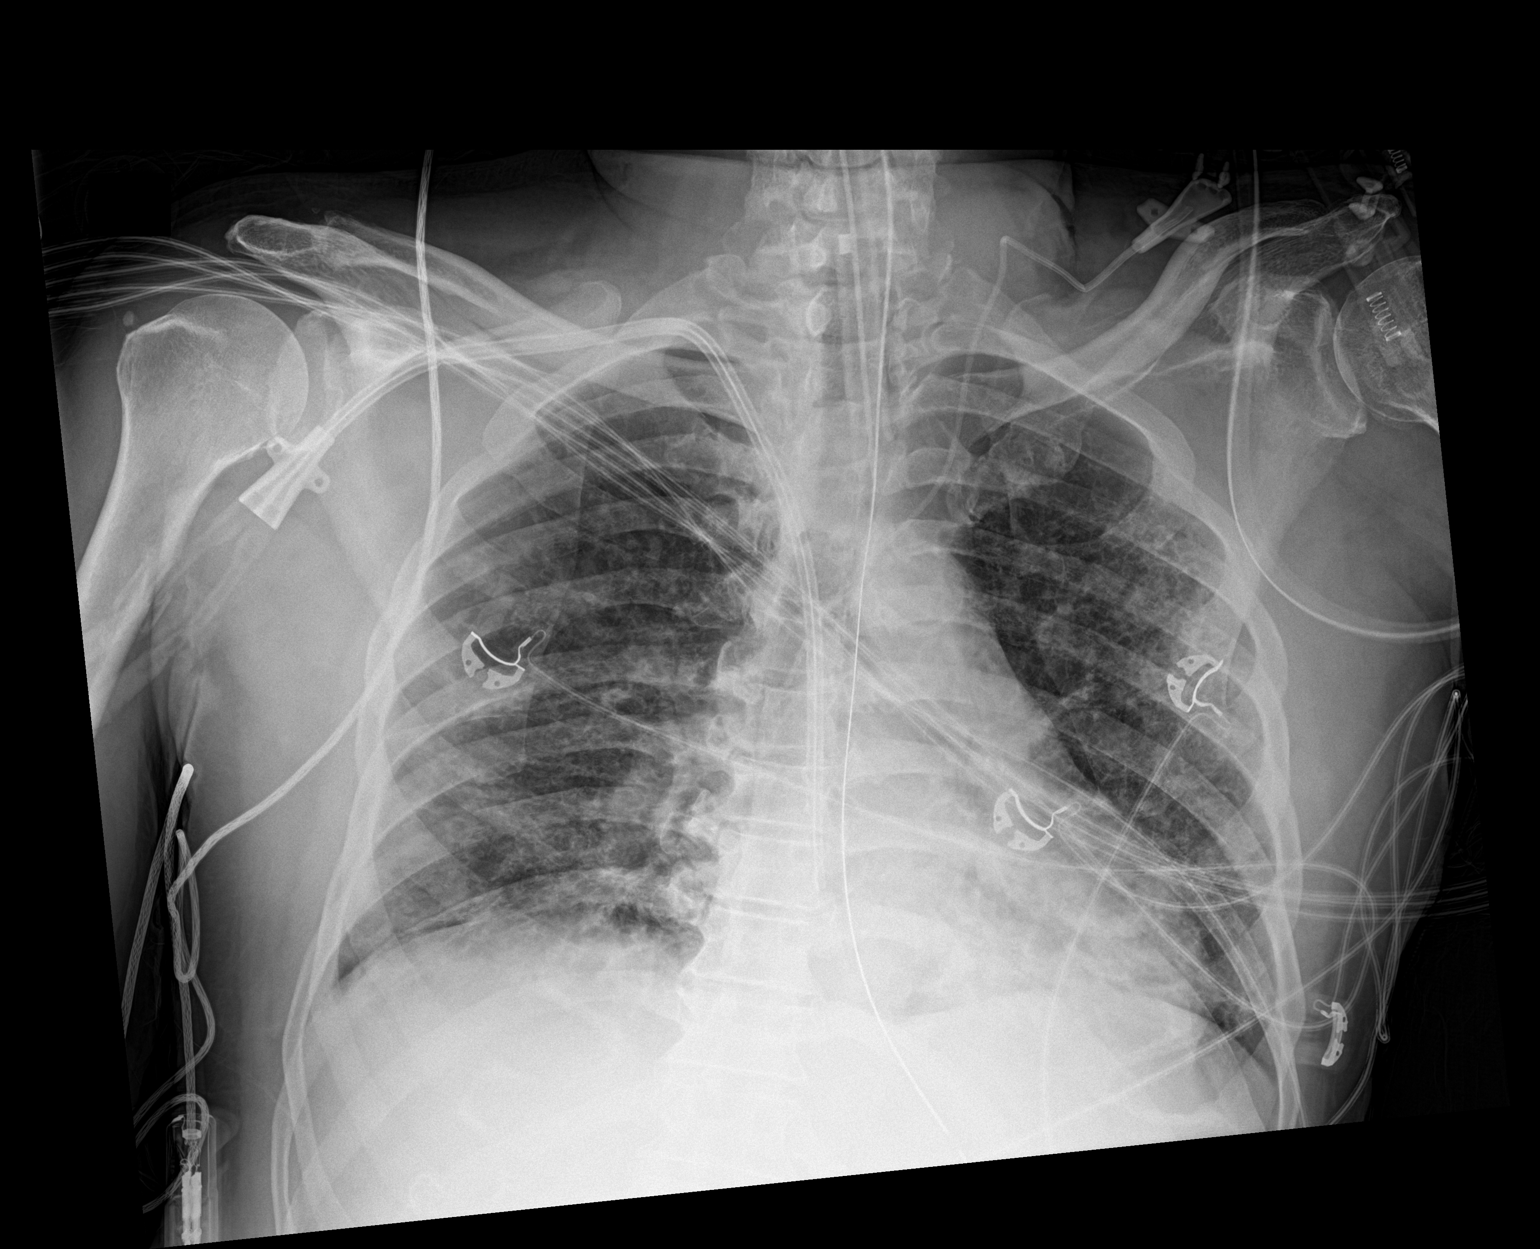

[1 of 1 positions shown; findings below may reference images not displayed]

FINDINGS: Support tubes and lines are unchanged and project in good position.
Hazy bilateral pulmonary opacities seen on yesterday's examination
persist without marked change. Heart size is normal. No pneumothorax
or pleural effusion.
IMPRESSION: No change in left worse than right patchy airspace disease worrisome
for pneumonia.

Support tubes and lines projecting good position.

## 2020-09-09 IMAGING — US US RENAL
1 series · 14 of 25 positions shown · non-contrast
Comparison: None.

CLINICAL DATA: Acute renal insufficiency.

EXAM:
RENAL / URINARY TRACT ULTRASOUND COMPLETE

[Series 1: us renal · 14 of 51 slices shown]
[im 1/51]
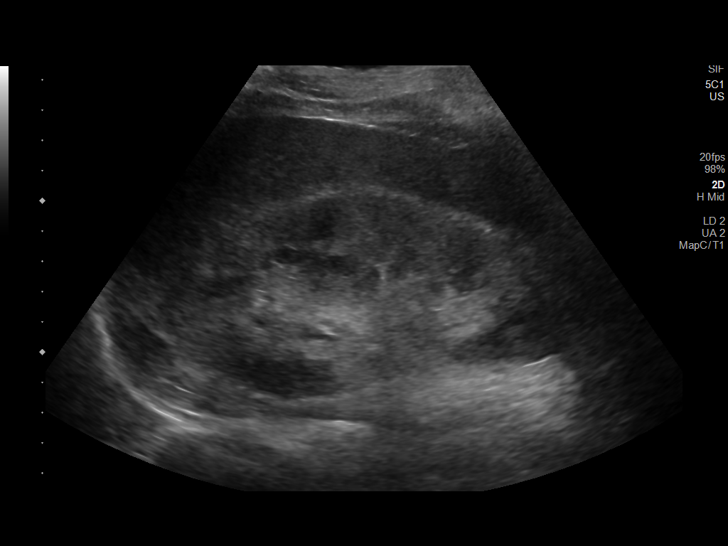
[im 5/51]
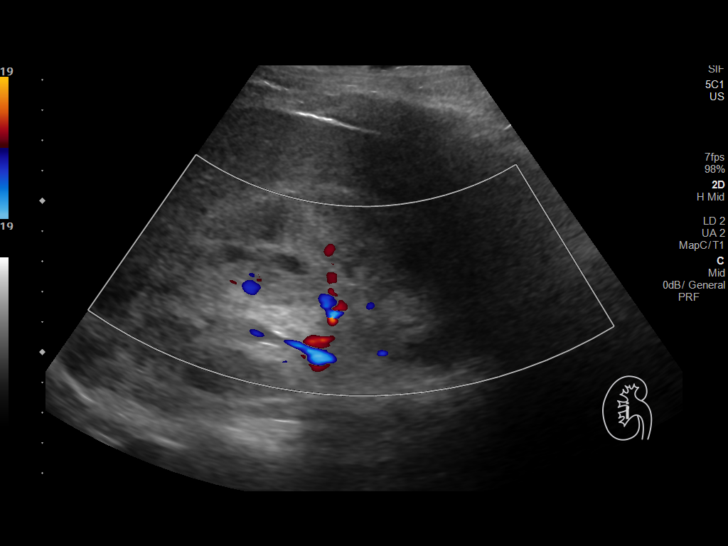
[im 9/51]
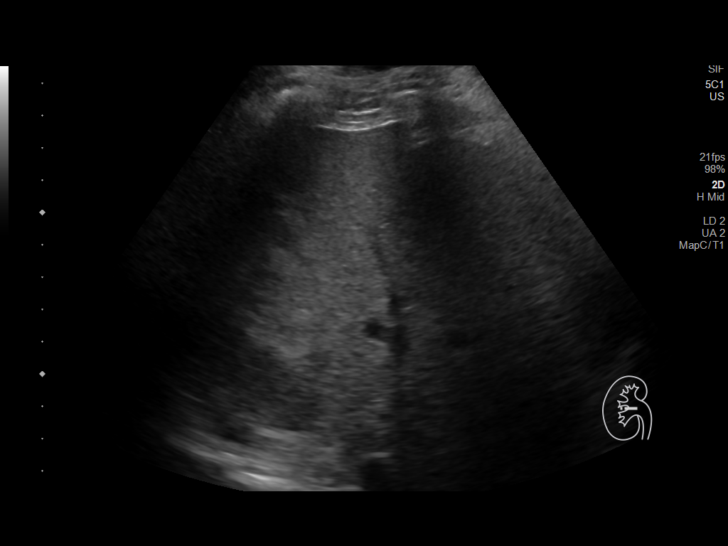
[im 13/51]
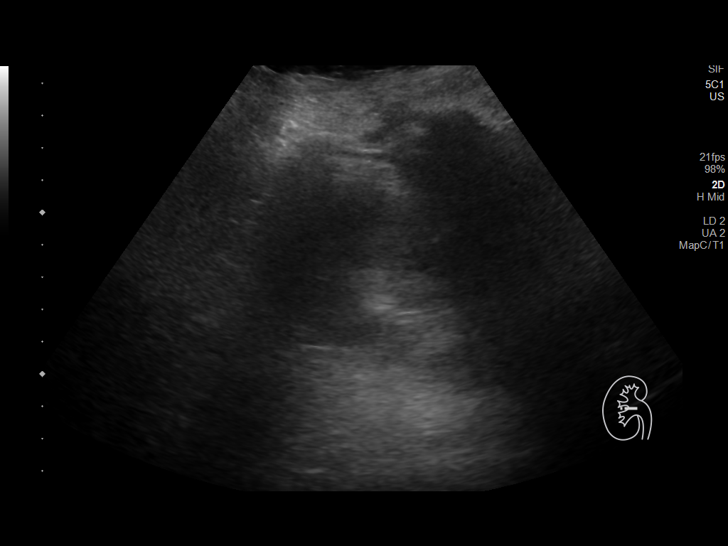
[im 17/51]
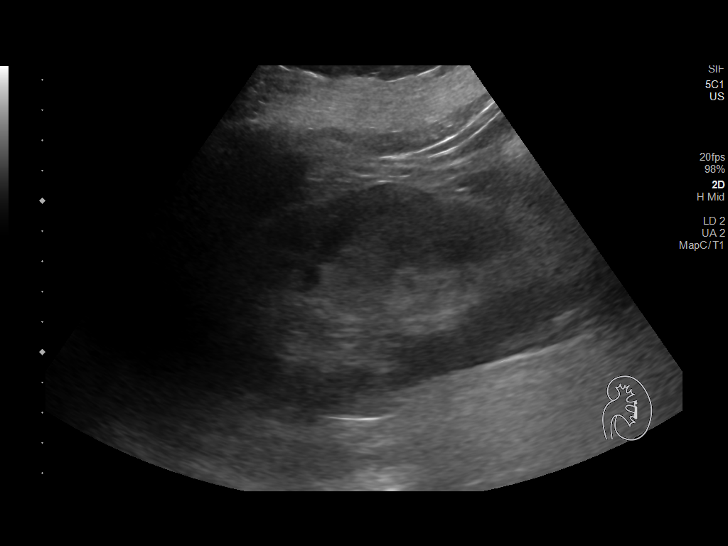
[im 19/51]
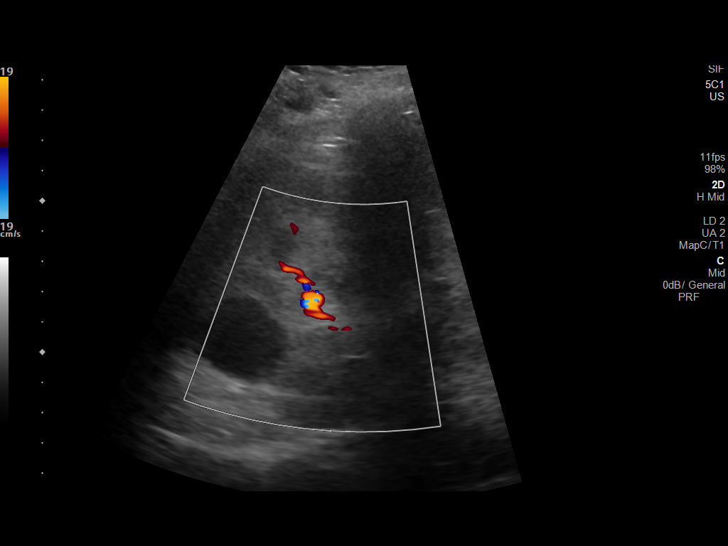
[im 23/51]
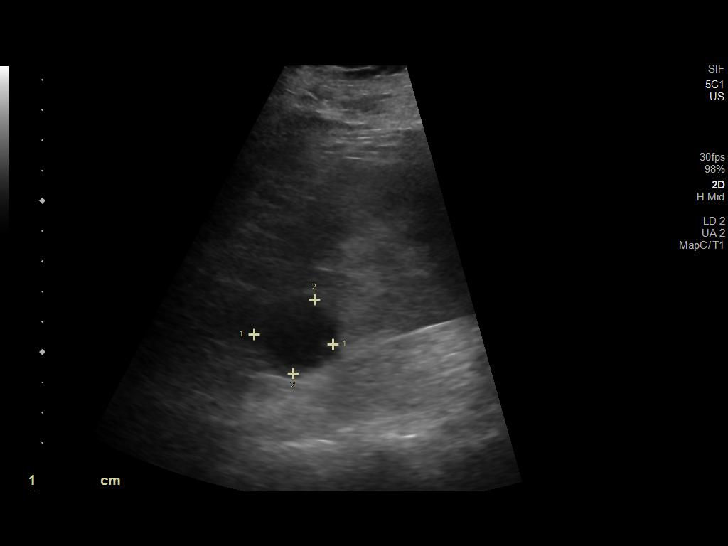
[im 28/51]
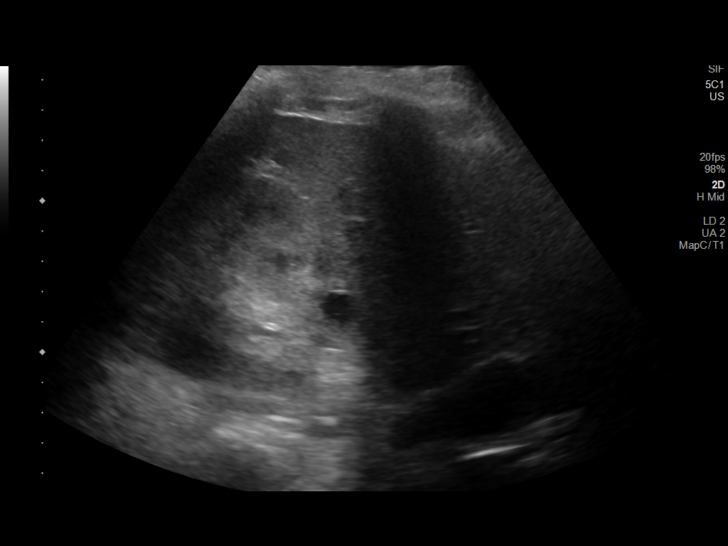
[im 32/51]
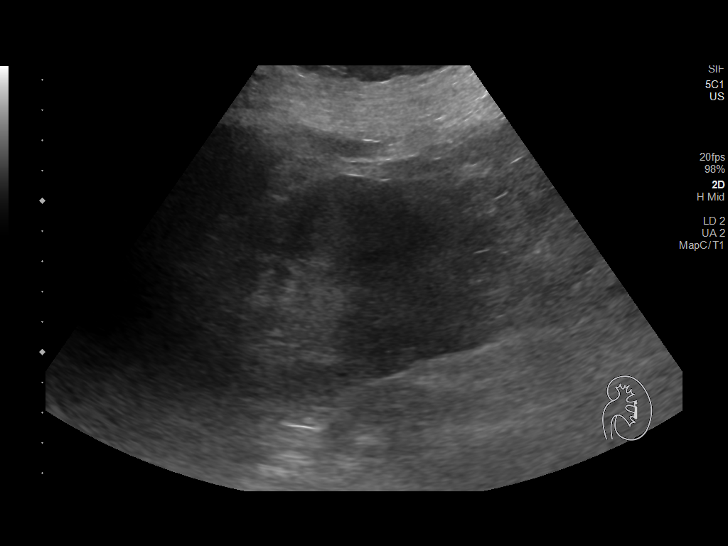
[im 34/51]
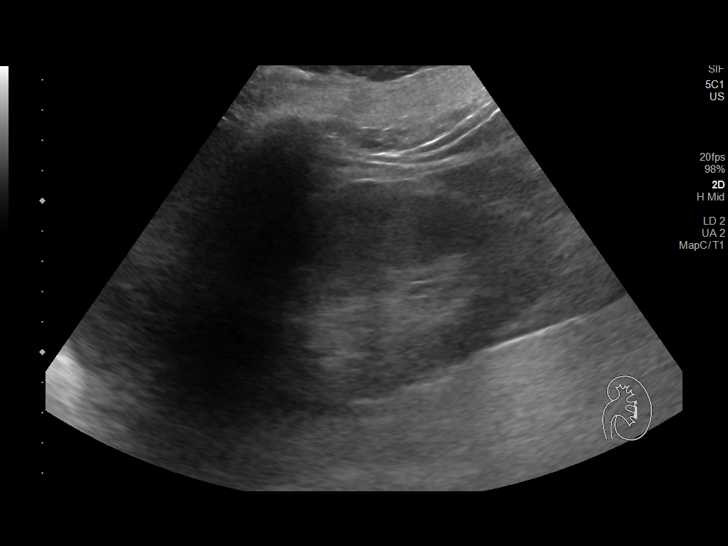
[im 38/51]
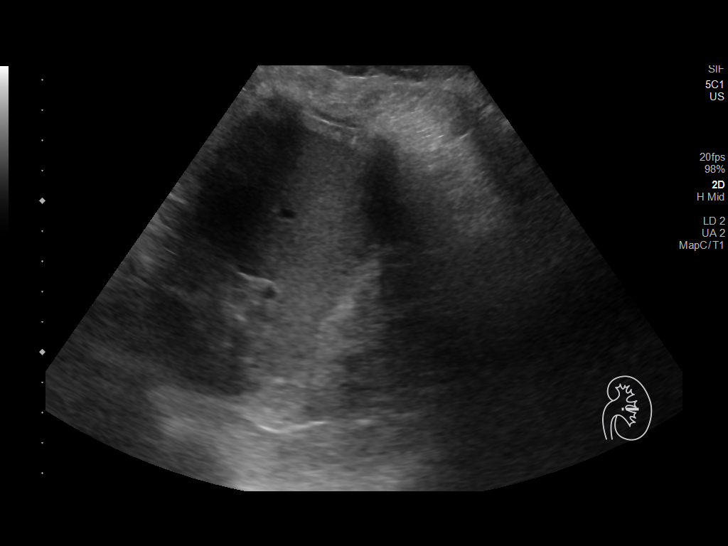
[im 42/51]
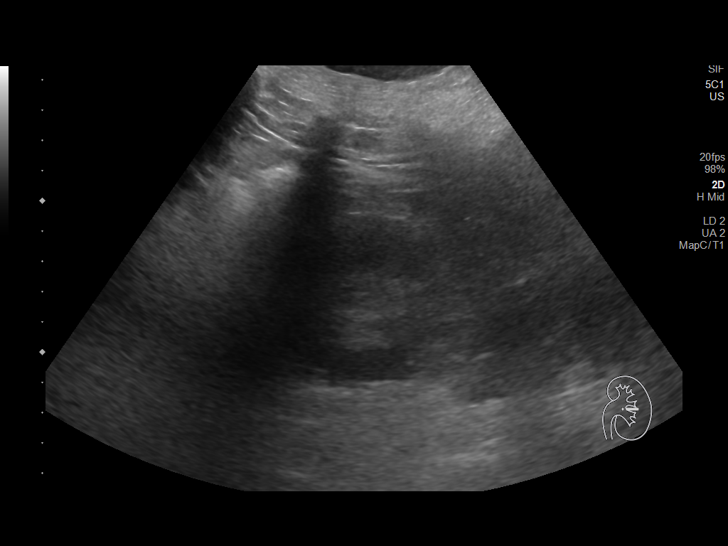
[im 46/51]
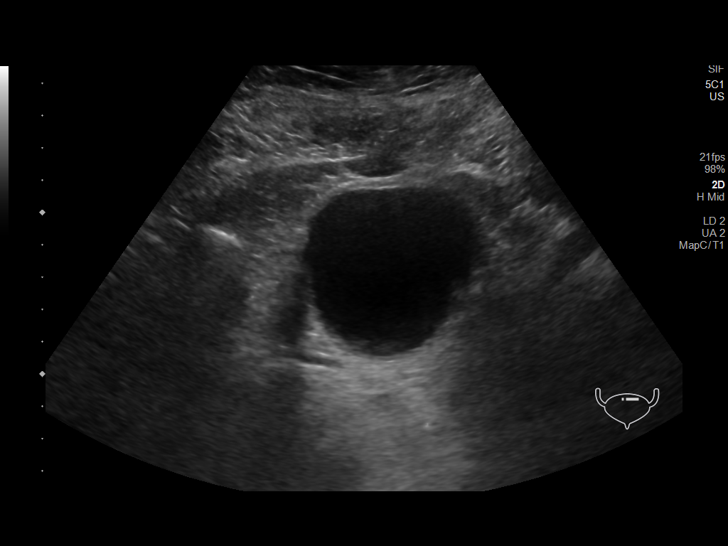
[im 51/51]
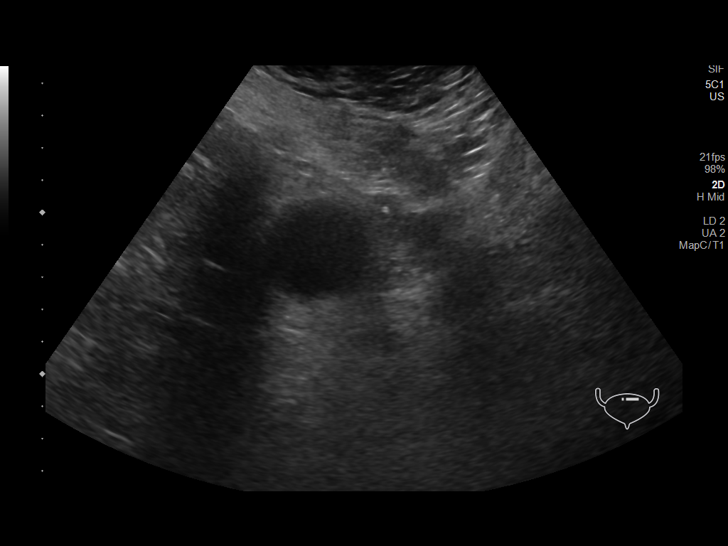

[14 of 25 positions shown; findings below may reference images not displayed]

FINDINGS: Right Kidney:

Renal measurements: 12.3 x 6.5 x 5.2 cm = volume: 215 mL. Increased
cortical echogenicity. Benign-appearing cyst is noted in the upper
pole of the right kidney measuring 2.6 x 2.5 x 2.6 cm. A second
smaller cyst is seen adjacent to the superior right renal pelvis
measuring 1.5 x 1.2 x 1.1 cm.

Left Kidney:

Renal measurements: 12.9 x 6.5 x 5.3 cm = volume: 233 mL. Increased
parenchymal echogenicity.

Bladder:

Appears normal for degree of bladder distention.
IMPRESSION: Increased parenchymal echogenicity of the kidneys usually associated
with intrinsic renal disease.

Two benign-appearing right renal cysts.

## 2020-09-09 IMAGING — MR MR MRA HEAD W/O CM
1 series · 20 of 48 positions shown · non-contrast
Comparison: MR head without contrast 04/02/2019.

CLINICAL DATA: Nystagmus or other irregular eye movements 3rd nerve
palsy on the right.

EXAM:
MRA HEAD WITHOUT CONTRAST
TECHNIQUE: Angiographic images of the Circle of Willis were obtained using MRA
technique without intravenous contrast.

[Series 4: ax (id) · axial · 1.0mm · 0.43mm/px · z∈[-59,+24]mm · 20 of 176 slices shown]
[im 1/176]
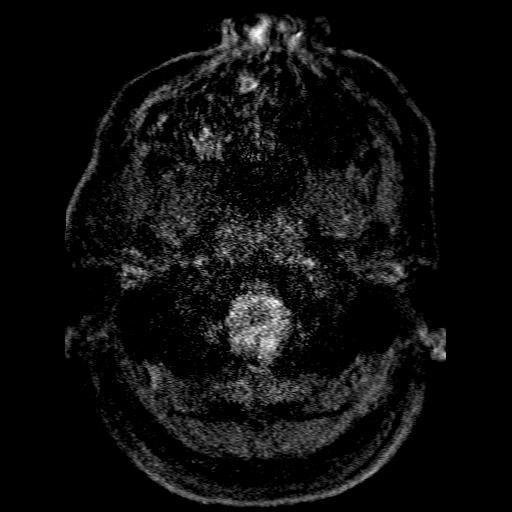
[im 4/176]
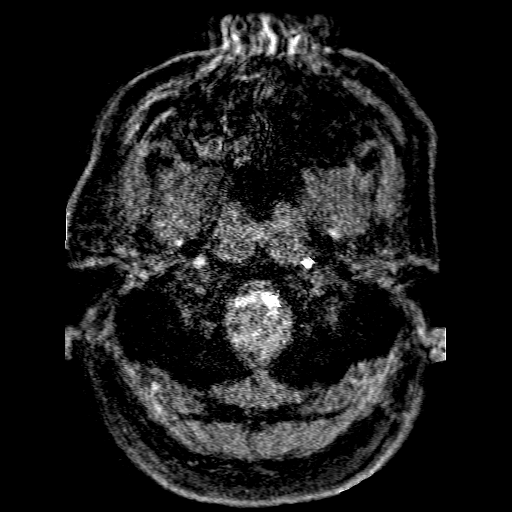
[im 8/176]
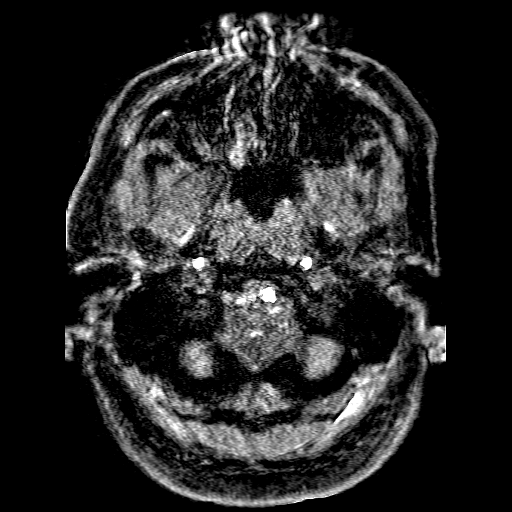
[im 12/176]
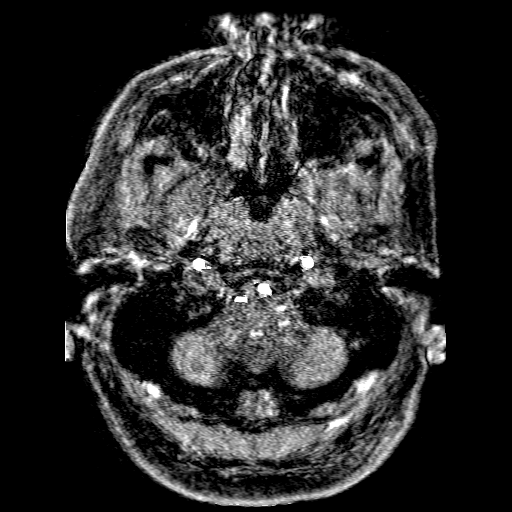
[im 15/176]
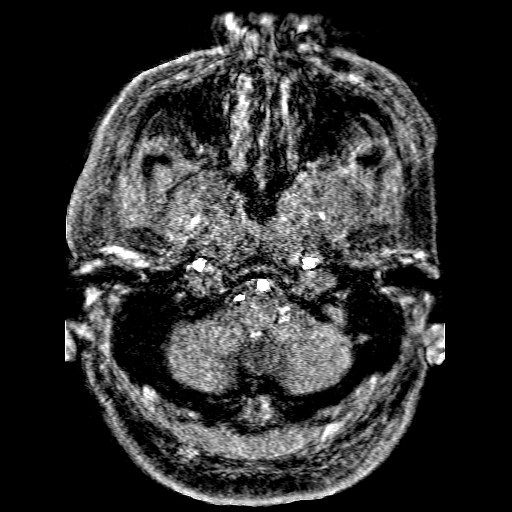
[im 19/176]
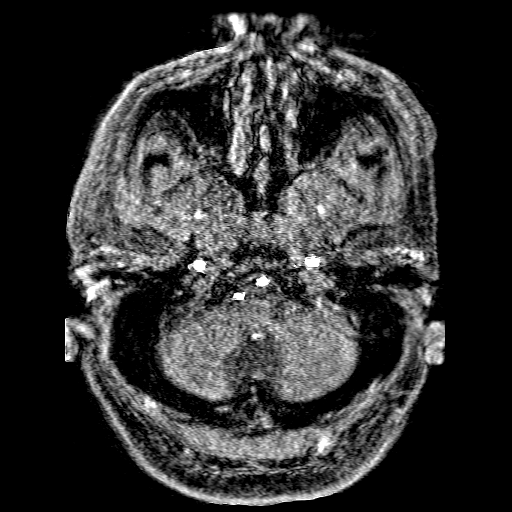
[im 23/176]
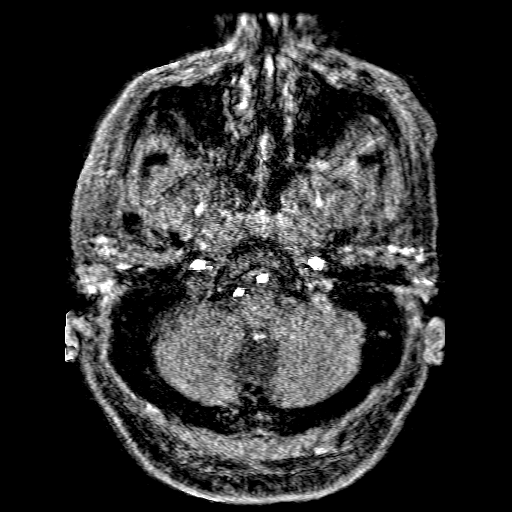
[im 27/176]
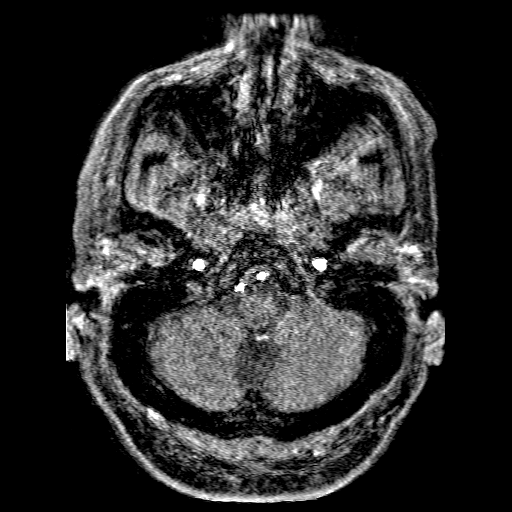
[im 30/176]
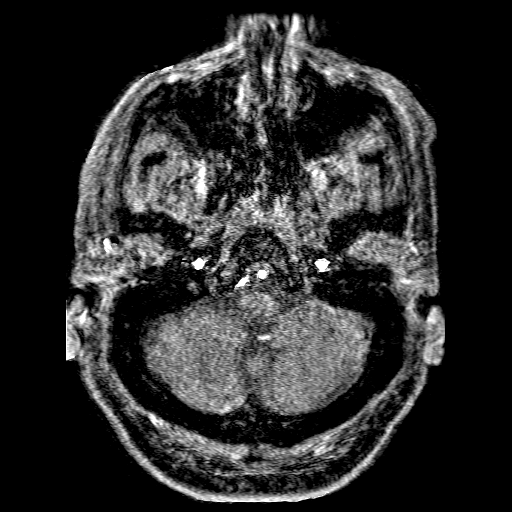
[im 34/176]
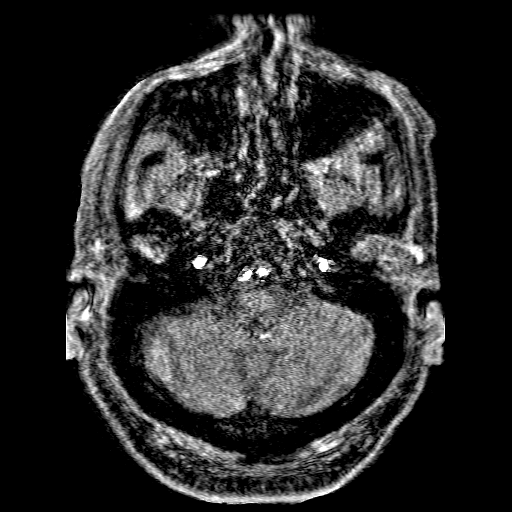
[im 38/176]
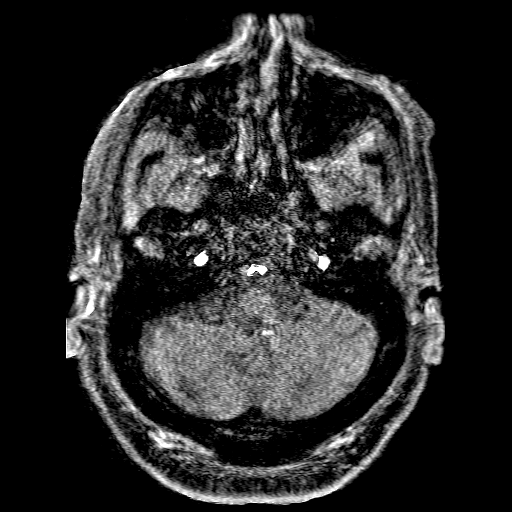
[im 41/176]
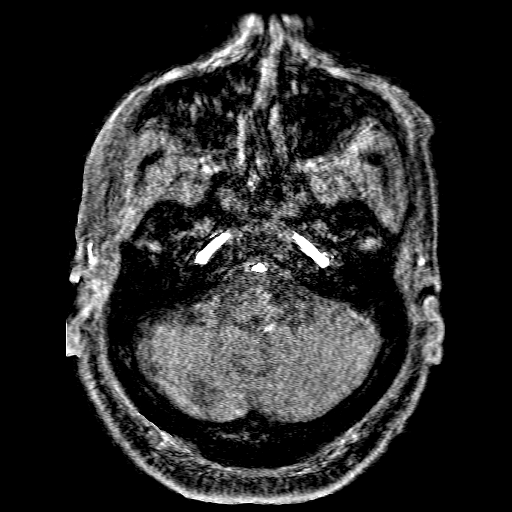
[im 56/176]
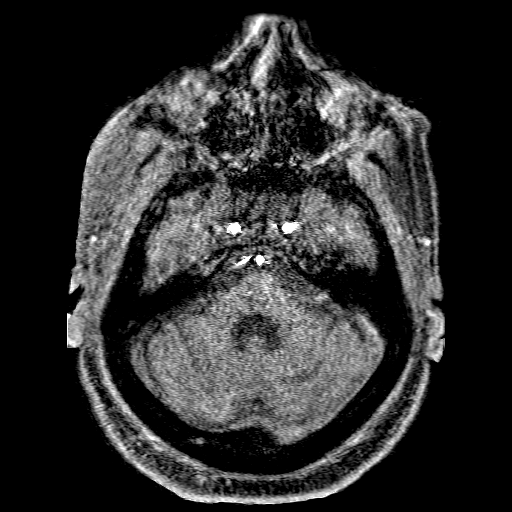
[im 79/176]
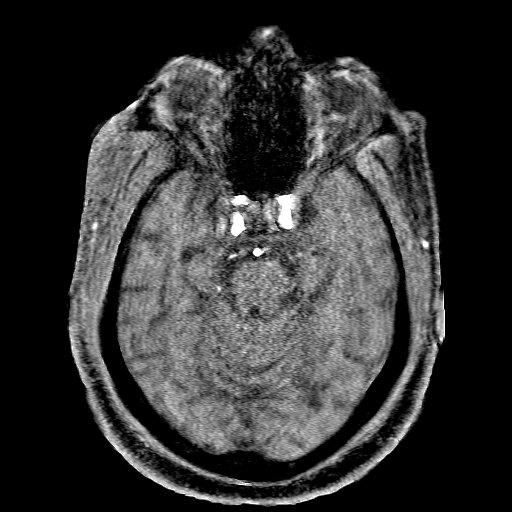
[im 90/176]
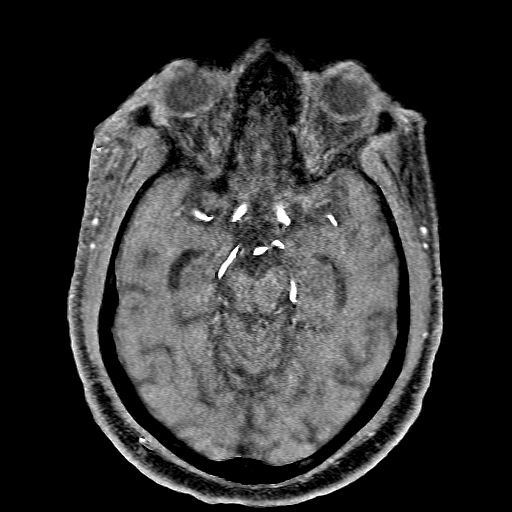
[im 101/176]
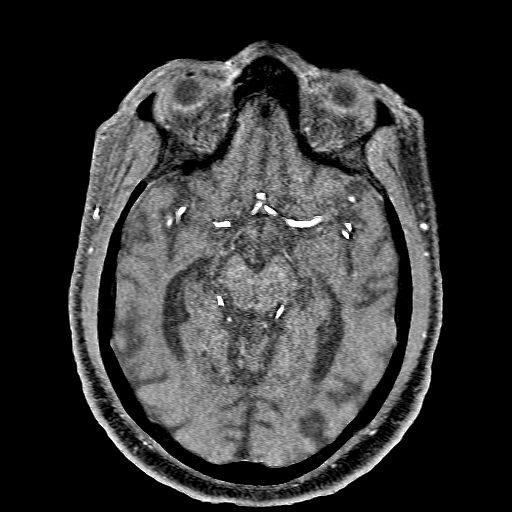
[im 123/176]
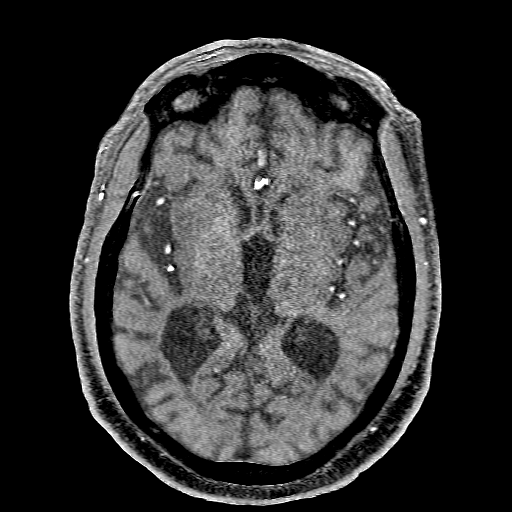
[im 146/176]
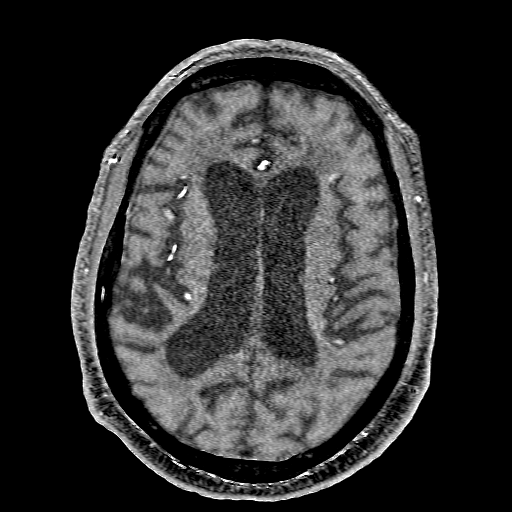
[im 149/176]
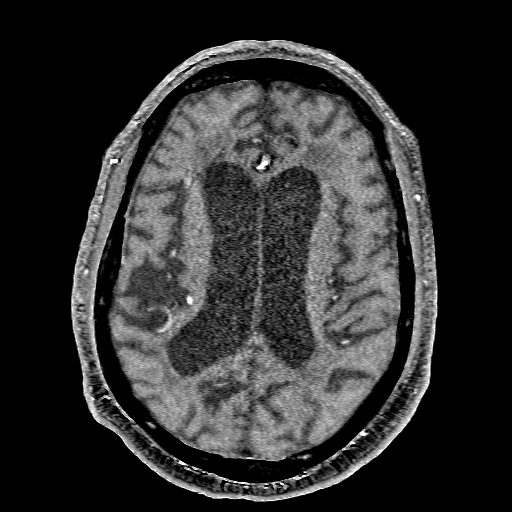
[im 168/176]
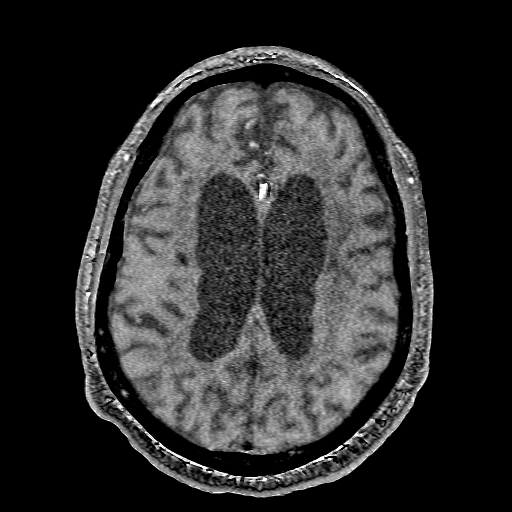

[20 of 48 positions shown; findings below may reference images not displayed]

FINDINGS: The internal carotid arteries are within normal limits from the high
cervical segments through the ICA termini bilaterally. The A1 and M1
segments are normal. MCA bifurcations are intact. The ACA and MCA
branch vessels are within normal limits.

The left vertebral artery is dominant. Study is mildly degraded by
patient motion. PICA origins are not clearly seen. Left PICA is
patent. Basilar artery is within normal limits. Both posterior
cerebral arteries originate from basilar tip. PCA branch vessels are
within normal limits.
IMPRESSION: Normal MRA circle-of-Willis without significant proximal stenosis,
aneurysm, or branch vessel occlusion. No focal lesion to explain
third nerve palsy.

## 2020-10-27 DEATH — deceased
